# Patient Record
Sex: Male | Born: 1970 | Race: White | Hispanic: No | Marital: Married | Smoking: Never smoker
Health system: Southern US, Community
[De-identification: ages and names within clinical notes are randomized; demographics above are authoritative.]

## PROBLEM LIST (undated history)

## (undated) DIAGNOSIS — R7989 Other specified abnormal findings of blood chemistry: Secondary | ICD-10-CM

## (undated) DIAGNOSIS — K219 Gastro-esophageal reflux disease without esophagitis: Secondary | ICD-10-CM

## (undated) DIAGNOSIS — I10 Essential (primary) hypertension: Secondary | ICD-10-CM

## (undated) DIAGNOSIS — E119 Type 2 diabetes mellitus without complications: Secondary | ICD-10-CM

## (undated) DIAGNOSIS — I4891 Unspecified atrial fibrillation: Secondary | ICD-10-CM

## (undated) DIAGNOSIS — G4730 Sleep apnea, unspecified: Secondary | ICD-10-CM

## (undated) DIAGNOSIS — M109 Gout, unspecified: Secondary | ICD-10-CM

## (undated) HISTORY — PX: CARDIAC CATHETERIZATION: SHX172

## (undated) HISTORY — PX: COLONOSCOPY: SHX174

---

## 2006-01-29 ENCOUNTER — Ambulatory Visit: Payer: Self-pay | Admitting: Cardiology

## 2006-02-06 ENCOUNTER — Ambulatory Visit: Payer: Self-pay | Admitting: Cardiology

## 2006-02-08 ENCOUNTER — Inpatient Hospital Stay (HOSPITAL_BASED_OUTPATIENT_CLINIC_OR_DEPARTMENT_OTHER): Admission: RE | Admit: 2006-02-08 | Discharge: 2006-02-08 | Payer: Self-pay | Admitting: Cardiovascular Disease

## 2006-02-08 ENCOUNTER — Ambulatory Visit: Payer: Self-pay | Admitting: Cardiovascular Disease

## 2011-03-07 DIAGNOSIS — R079 Chest pain, unspecified: Secondary | ICD-10-CM

## 2012-02-11 ENCOUNTER — Encounter (INDEPENDENT_AMBULATORY_CARE_PROVIDER_SITE_OTHER): Payer: Self-pay

## 2014-09-30 ENCOUNTER — Encounter (INDEPENDENT_AMBULATORY_CARE_PROVIDER_SITE_OTHER): Payer: Self-pay | Admitting: *Deleted

## 2015-02-01 ENCOUNTER — Other Ambulatory Visit (INDEPENDENT_AMBULATORY_CARE_PROVIDER_SITE_OTHER): Payer: Self-pay | Admitting: *Deleted

## 2015-02-01 DIAGNOSIS — Z86010 Personal history of colonic polyps: Secondary | ICD-10-CM

## 2015-02-01 DIAGNOSIS — Z800 Family history of malignant neoplasm of digestive organs: Secondary | ICD-10-CM

## 2015-03-02 ENCOUNTER — Encounter (INDEPENDENT_AMBULATORY_CARE_PROVIDER_SITE_OTHER): Payer: Self-pay | Admitting: *Deleted

## 2015-03-02 ENCOUNTER — Telehealth (INDEPENDENT_AMBULATORY_CARE_PROVIDER_SITE_OTHER): Payer: Self-pay | Admitting: *Deleted

## 2015-03-02 MED ORDER — PEG 3350-KCL-NA BICARB-NACL 420 G PO SOLR: 4000.0000 mL | Freq: Once | ORAL | Status: DC

## 2015-03-02 NOTE — Telephone Encounter (Signed)
Patient needs trilyte 

## 2015-03-02 NOTE — Telephone Encounter (Signed)
This encounter was created in error - please disregard.

## 2015-03-04 ENCOUNTER — Encounter (INDEPENDENT_AMBULATORY_CARE_PROVIDER_SITE_OTHER): Payer: Self-pay | Admitting: *Deleted

## 2015-03-04 ENCOUNTER — Telehealth (INDEPENDENT_AMBULATORY_CARE_PROVIDER_SITE_OTHER): Payer: Self-pay | Admitting: *Deleted

## 2015-03-04 NOTE — Telephone Encounter (Signed)
Referring MD/PCP: shah   Procedure: tcs  Reason/Indication:  Hx polyps, fam hx colon ca  Has patient had this procedure before?  Yes, 2011 -- scanned  If so, when, by whom and where?    Is there a family history of colon cancer?  Yes, father  Who?  What age when diagnosed?    Is patient diabetic?   yes      Does patient have prosthetic heart valve or mechanical valve?  no  Do you have a pacemaker?  no  Has patient ever had endocarditis? Yes, possibly at age 45, diagnosis not definitive, was given PCN -- no antibiotic needed per Dr Laural Golden  Has patient had joint replacement within last 12 months?  no  Does patient tend to be constipated or take laxatives? no  Does patient have a history of alcohol/drug use?  no  Is patient on Coumadin, Plavix and/or Aspirin? yes  Medications: asa 81 mg daily, metformin 500 mg  Allergies: possibily PCN -- not definitive but avoids it  Medication Adjustment: asa 2 days, metformin evening before & mornin gof  Procedure date & time: 03/31/15 at 930

## 2015-03-08 NOTE — Telephone Encounter (Signed)
agree

## 2015-04-12 ENCOUNTER — Encounter (INDEPENDENT_AMBULATORY_CARE_PROVIDER_SITE_OTHER): Payer: Self-pay | Admitting: *Deleted

## 2015-05-11 ENCOUNTER — Encounter (HOSPITAL_COMMUNITY): Payer: Self-pay | Admitting: *Deleted

## 2015-05-11 ENCOUNTER — Encounter (HOSPITAL_COMMUNITY)
Admission: RE | Disposition: A | Discharge status: Home / Self Care | Payer: Self-pay | Source: Ambulatory Visit | Attending: Internal Medicine

## 2015-05-11 ENCOUNTER — Ambulatory Visit (HOSPITAL_COMMUNITY)
Admission: RE | Admit: 2015-05-11 | Discharge: 2015-05-11 | Disposition: A | Discharge status: Home / Self Care | Payer: PRIVATE HEALTH INSURANCE | Source: Ambulatory Visit | Attending: Internal Medicine | Admitting: Internal Medicine

## 2015-05-11 DIAGNOSIS — K648 Other hemorrhoids: Secondary | ICD-10-CM | POA: Insufficient documentation

## 2015-05-11 DIAGNOSIS — E669 Obesity, unspecified: Secondary | ICD-10-CM | POA: Diagnosis not present

## 2015-05-11 DIAGNOSIS — I10 Essential (primary) hypertension: Secondary | ICD-10-CM | POA: Diagnosis not present

## 2015-05-11 DIAGNOSIS — Z6841 Body Mass Index (BMI) 40.0 and over, adult: Secondary | ICD-10-CM | POA: Insufficient documentation

## 2015-05-11 DIAGNOSIS — K219 Gastro-esophageal reflux disease without esophagitis: Secondary | ICD-10-CM | POA: Insufficient documentation

## 2015-05-11 DIAGNOSIS — G4730 Sleep apnea, unspecified: Secondary | ICD-10-CM | POA: Insufficient documentation

## 2015-05-11 DIAGNOSIS — D122 Benign neoplasm of ascending colon: Secondary | ICD-10-CM | POA: Diagnosis not present

## 2015-05-11 DIAGNOSIS — Z880 Allergy status to penicillin: Secondary | ICD-10-CM | POA: Diagnosis not present

## 2015-05-11 DIAGNOSIS — Z1211 Encounter for screening for malignant neoplasm of colon: Secondary | ICD-10-CM | POA: Diagnosis present

## 2015-05-11 DIAGNOSIS — Z86010 Personal history of colonic polyps: Secondary | ICD-10-CM

## 2015-05-11 DIAGNOSIS — Z7984 Long term (current) use of oral hypoglycemic drugs: Secondary | ICD-10-CM | POA: Insufficient documentation

## 2015-05-11 DIAGNOSIS — Z79899 Other long term (current) drug therapy: Secondary | ICD-10-CM | POA: Insufficient documentation

## 2015-05-11 DIAGNOSIS — E119 Type 2 diabetes mellitus without complications: Secondary | ICD-10-CM | POA: Insufficient documentation

## 2015-05-11 DIAGNOSIS — Z800 Family history of malignant neoplasm of digestive organs: Secondary | ICD-10-CM | POA: Diagnosis not present

## 2015-05-11 HISTORY — DX: Sleep apnea, unspecified: G47.30

## 2015-05-11 HISTORY — DX: Type 2 diabetes mellitus without complications: E11.9

## 2015-05-11 HISTORY — PX: COLONOSCOPY: SHX5424

## 2015-05-11 HISTORY — DX: Essential (primary) hypertension: I10

## 2015-05-11 HISTORY — DX: Gastro-esophageal reflux disease without esophagitis: K21.9

## 2015-05-11 LAB — GLUCOSE, CAPILLARY: GLUCOSE-CAPILLARY: 148 mg/dL — AB (ref 65–99)

## 2015-05-11 SURGERY — COLONOSCOPY
Anesthesia: Moderate Sedation

## 2015-05-11 MED ORDER — STERILE WATER FOR IRRIGATION IR SOLN
Status: DC | PRN
  Administered 2015-05-11: 09:00:00

## 2015-05-11 MED ORDER — MIDAZOLAM HCL 5 MG/5ML IJ SOLN
INTRAMUSCULAR | Status: DC | PRN
  Administered 2015-05-11: 3 mg via INTRAVENOUS
  Administered 2015-05-11 (×3): 2 mg via INTRAVENOUS
  Administered 2015-05-11: 1 mg via INTRAVENOUS
  Administered 2015-05-11 (×2): 2 mg via INTRAVENOUS
  Administered 2015-05-11: 1 mg via INTRAVENOUS

## 2015-05-11 MED ORDER — SODIUM CHLORIDE 0.9 % IV SOLN
INTRAVENOUS | Status: DC
  Administered 2015-05-11: 09:00:00 via INTRAVENOUS

## 2015-05-11 MED ORDER — MEPERIDINE HCL 50 MG/ML IJ SOLN
INTRAMUSCULAR | Status: AC
  Filled 2015-05-11: qty 1

## 2015-05-11 MED ORDER — MIDAZOLAM HCL 5 MG/5ML IJ SOLN
INTRAMUSCULAR | Status: AC
  Filled 2015-05-11: qty 5

## 2015-05-11 MED ORDER — MEPERIDINE HCL 50 MG/ML IJ SOLN
INTRAMUSCULAR | Status: DC | PRN
  Administered 2015-05-11 (×4): 25 mg via INTRAVENOUS

## 2015-05-11 MED ORDER — MIDAZOLAM HCL 5 MG/5ML IJ SOLN
INTRAMUSCULAR | Status: AC
  Filled 2015-05-11: qty 10

## 2015-05-11 NOTE — H&P (Addendum)
Albert [name] is an 45 y.o. male.   Chief Complaint: Patient is here for colonoscopy. HPI: Patient is 43 old Caucasian male who is here for screening colonoscopy. He denies abdominal pain change in bowel habits or rectal bleeding. Last colonoscopy was normal in July 2011 with removal of small tubular adenoma an sessile serrated polyp. Family history significant for CRC and father who was 6 at the time of diagnosis and doing well at 33.  Past Medical History  Diagnosis Date  . Hypertension   . Diabetes mellitus without complication ([place])   . Sleep apnea   . GERD (gastroesophageal reflux disease)     Past Surgical History  Procedure Laterality Date  . Cardiac catheterization    . Colonoscopy      Family History  Problem Relation Age of Onset  . Colon cancer Father    Social History:  reports that he has never smoked. He does not have any smokeless tobacco history on file. He reports that he drinks alcohol. He reports that he does not use illicit drugs.  Allergies:  Allergies  Allergen Reactions  . Penicillins     Medications Prior to Admission  Medication Sig Dispense Refill  . anastrozole (ARIMIDEX) 1 MG tablet Take 1 mg by mouth daily.    [name] lisinopril (PRINIVIL,ZESTRIL) 40 MG tablet Take 40 mg by mouth daily.    . metFORMIN (GLUCOPHAGE) 500 MG tablet Take 500 mg by mouth 2 (two) times daily with a meal.    . omeprazole (PRILOSEC) 20 MG capsule Take 20 mg by mouth daily.    . polyethylene glycol-electrolytes (NULYTELY/GOLYTELY) 420 G solution Take 4,000 mLs by mouth once. 4000 mL 0  . tadalafil (CIALIS) 20 MG tablet Take 20 mg by mouth daily as needed for erectile dysfunction.    [name] testosterone cypionate (DEPOTESTOSTERONE CYPIONATE) 200 MG/ML injection Inject 200 mg into the muscle every 14 (fourteen) days.      Results for orders placed or performed during the hospital encounter of 05/11/15 (from the past 48 hour(s))  Glucose, capillary     Status: Abnormal   Collection Time: 05/11/15  8:56 AM  Result Value Ref Range   Glucose-Capillary 148 (H) 65 - 99 mg/dL   No results found.  ROS  Blood pressure 127/86, pulse 101, temperature 97.7 F (36.5 C), temperature source Oral, resp. rate 20, height 5\' 10"  (1.778 m), weight 380 lb (172.367 kg), SpO2 95 %. Physical Exam  Constitutional:  WD Obese Caucasian male in NAD  HENT:  Mouth/Throat: Oropharynx is clear and moist.  Eyes: Conjunctivae are normal. No scleral icterus.  Neck: No thyromegaly present.  Cardiovascular: Normal rate, regular rhythm and normal heart sounds.   No murmur heard. Respiratory: Effort normal and breath sounds normal.  GI: Soft. He exhibits no distension and no mass. There is no tenderness.  Musculoskeletal: He exhibits no edema.  Lymphadenopathy:    He has no cervical adenopathy.  Neurological: He is alert.  Skin: Skin is warm and dry.     Assessment/Plan  history of colonic adenomas.  History of CRC in father.  Surveillance colonoscopy.[name]  Rogene Houston, MD 05/11/2015, 9:15 AM

## 2015-05-11 NOTE — Discharge Instructions (Signed)
No aspirin or NSAIDs for 1 week. Resume usual medications and diet. No driving for 24 hours. Patient will call with biopsy results.    Colonoscopy, Care After These instructions give you information on caring for yourself after your procedure. Your doctor may also give you more specific instructions. Call your doctor if you have any problems or questions after your procedure. HOME CARE  Do not drive for 24 hours.  Do not sign important papers or use machinery for 24 hours.  You may shower.  You may go back to your usual activities, but go slower for the first 24 hours.  Take rest breaks often during the first 24 hours.  Walk around or use warm packs on your belly (abdomen) if you have belly cramping or gas.  Drink enough fluids to keep your pee (urine) clear or pale yellow.  Resume your normal diet. Avoid heavy or fried foods.  Avoid drinking alcohol for 24 hours or as told by your doctor.  Only take medicines as told by your doctor. If a tissue sample (biopsy) was taken during the procedure:   Do not take aspirin or blood thinners for 7 days, or as told by your doctor.  Do not drink alcohol for 7 days, or as told by your doctor.  Eat soft foods for the first 24 hours. GET HELP IF: You still have a small amount of blood in your poop (stool) 2-3 days after the procedure. GET HELP RIGHT AWAY IF:  You have more than a small amount of blood in your poop.  You see clumps of tissue (blood clots) in your poop.  Your belly is puffy (swollen).  You feel sick to your stomach (nauseous) or throw up (vomit).  You have a fever.  You have belly pain that gets worse and medicine does not help. MAKE SURE YOU:  Understand these instructions.  Will watch your condition.  Will get help right away if you are not doing well or get worse.   This information is not intended to replace advice given to you by your health care provider. Make sure you discuss any questions you have  with your health care provider.   Document Released: 04/14/2010 Document Revised: 03/17/2013 Document Reviewed: 11/17/2012 Elsevier Interactive Patient Education 2016 Elsevier Inc[employer]   Colon Polyps Polyps are lumps of extra tissue growing inside the body. Polyps can grow in the large intestine (colon). Most colon polyps are noncancerous (benign). However, some colon polyps can become cancerous over time. Polyps that are larger than a pea may be harmful. To be safe, caregivers remove and test all polyps. CAUSES  Polyps form when mutations in the genes cause your cells to grow and divide even though no more tissue is needed. RISK FACTORS There are a number of risk factors that can increase your chances of getting colon polyps. They include:  Being older than 50 years.  Family history of colon polyps or colon cancer.  Long-term colon diseases, such as colitis or Crohn disease.  Being overweight.  Smoking.  Being inactive.  Drinking too much alcohol. SYMPTOMS  Most small polyps do not cause symptoms. If symptoms are present, they may include:  Blood in the stool. The stool may look dark red or black.  Constipation or diarrhea that lasts longer than 1 week. DIAGNOSIS People often do not know they have polyps until their caregiver finds them during a regular checkup. Your caregiver can use 4 tests to check for polyps:  Digital rectal exam. The  caregiver wears gloves and feels inside the rectum. This test would find polyps only in the rectum.  Barium enema. The caregiver puts a liquid called barium into your rectum before taking X-rays of your colon. Barium makes your colon look white. Polyps are dark, so they are easy to see in the X-ray pictures.  Sigmoidoscopy. A thin, flexible tube (sigmoidoscope) is placed into your rectum. The sigmoidoscope has a light and tiny camera in it. The caregiver uses the sigmoidoscope to look at the last third of your colon.  Colonoscopy. This test is  like sigmoidoscopy, but the caregiver looks at the entire colon. This is the most common method for finding and removing polyps. TREATMENT  Any polyps will be removed during a sigmoidoscopy or colonoscopy. The polyps are then tested for cancer. PREVENTION  To help lower your risk of getting more colon polyps:  Eat plenty of fruits and vegetables. Avoid eating fatty foods.  Do not smoke.  Avoid drinking alcohol.  Exercise every day.  Lose weight if recommended by your caregiver.  Eat plenty of calcium and folate. Foods that are rich in calcium include milk, cheese, and broccoli. Foods that are rich in folate include chickpeas, kidney beans, and spinach. HOME CARE INSTRUCTIONS Keep all follow-up appointments as directed by your caregiver. You may need periodic exams to check for polyps. SEEK MEDICAL CARE IF: You notice bleeding during a bowel movement.   This information is not intended to replace advice given to you by your health care provider. Make sure you discuss any questions you have with your health care provider.   Document Released: 12/07/2003 Document Revised: 04/02/2014 Document Reviewed: 05/22/2011 Elsevier Interactive Patient Education 2016 [employer].  Hemorrhoids Hemorrhoids are swollen veins around the rectum or anus. There are two types of hemorrhoids:   Internal hemorrhoids. These occur in the veins just inside the rectum. They may poke through to the outside and become irritated and painful.  External hemorrhoids. These occur in the veins outside the anus and can be felt as a painful swelling or hard lump near the anus. CAUSES  Pregnancy.   Obesity.   Constipation or diarrhea.   Straining to have a bowel movement.   Sitting for long periods on the toilet.  Heavy lifting or other activity that caused you to strain.  Anal intercourse. SYMPTOMS   Pain.   Anal itching or irritation.   Rectal bleeding.   Fecal leakage.   Anal swelling.    One or more lumps around the anus.  DIAGNOSIS  Your caregiver may be able to diagnose hemorrhoids by visual examination. Other examinations or tests that may be performed include:   Examination of the rectal area with a gloved hand (digital rectal exam).   Examination of anal canal using a small tube (scope).   A blood test if you have lost a significant amount of blood.  A test to look inside the colon (sigmoidoscopy or colonoscopy). TREATMENT Most hemorrhoids can be treated at home. However, if symptoms do not seem to be getting better or if you have a lot of rectal bleeding, your caregiver may perform a procedure to help make the hemorrhoids get smaller or remove them completely. Possible treatments include:   Placing a rubber band at the base of the hemorrhoid to cut off the circulation (rubber band ligation).   Injecting a chemical to shrink the hemorrhoid (sclerotherapy).   Using a tool to burn the hemorrhoid (infrared light therapy).   Surgically removing the hemorrhoid (  hemorrhoidectomy).   Stapling the hemorrhoid to block blood flow to the tissue (hemorrhoid stapling).  HOME CARE INSTRUCTIONS   Eat foods with fiber, such as whole grains, beans, nuts, fruits, and vegetables. Ask your doctor about taking products with added fiber in them (fibersupplements).  Increase fluid intake. Drink enough water and fluids to keep your urine clear or pale yellow.   Exercise regularly.   Go to the bathroom when you have the urge to have a bowel movement. Do not wait.   Avoid straining to have bowel movements.   Keep the anal area dry and clean. Use wet toilet paper or moist towelettes after a bowel movement.   Medicated creams and suppositories may be used or applied as directed.   Only take over-the-counter or prescription medicines as directed by your caregiver.   Take warm sitz baths for 15-20 minutes, 3-4 times a day to ease pain and discomfort.   Place  ice packs on the hemorrhoids if they are tender and swollen. Using ice packs between sitz baths may be helpful.   Put ice in a plastic bag.   Place a towel between your skin and the bag.   Leave the ice on for 15-20 minutes, 3-4 times a day.   Do not use a donut-shaped pillow or sit on the toilet for long periods. This increases blood pooling and pain.  SEEK MEDICAL CARE IF:  You have increasing pain and swelling that is not controlled by treatment or medicine.  You have uncontrolled bleeding.  You have difficulty or you are unable to have a bowel movement.  You have pain or inflammation outside the area of the hemorrhoids. MAKE SURE YOU:  Understand these instructions.  Will watch your condition.  Will get help right away if you are not doing well or get worse.   This information is not intended to replace advice given to you by your health care provider. Make sure you discuss any questions you have with your health care provider.   Document Released: 03/09/2000 Document Revised: 02/27/2012 Document Reviewed: 01/15/2012 Elsevier Interactive Patient Education [employer].

## 2015-05-11 NOTE — Op Note (Addendum)
COLONOSCOPY PROCEDURE REPORT  PATIENT:  Albert [name]  MR#:  [id number] Birthdate:  Oct 23, 1970, 45 y.o., male Endoscopist:  Dr. Rogene Houston, MD Referred By:  Dr. Monico Blitz, MD  Procedure Date: 05/11/2015  Procedure:   Colonoscopy with snare polypectomy.  Indications:  Patient is 45 year old Caucasian male who is undergoing high risk screening colonoscopy. Last exam was on 10/12/2009 with removal of 2 small polyps. Family history significant for CRC and father who was 51 at the time of diagnosis and is doing fine 12 years later.  Informed Consent:  The procedure and risks were reviewed with the patient and informed consent was obtained.  Medications:  Demerol 100 mg IV Versed 15 mg IV  First dose administered at. Last dose administered at  Description of procedure:  After a digital rectal exam was performed, that colonoscope was advanced from the anus through the rectum and colon to the area of the cecum, ileocecal valve and appendiceal orifice. The cecum was deeply intubated. These structures were well-seen and photographed for the record. From the level of the cecum and ileocecal valve, the scope was slowly and cautiously withdrawn. The mucosal surfaces were carefully surveyed utilizing scope tip to flexion to facilitate fold flattening as needed. The scope was pulled down into the rectum where a thorough exam including retroflexion was performed.  Findings:   Prep satisfactory. Lateral liquid stool was washed out of cecum nor to see the landmarks. 7 mm polyp hot snared from ascending colon. Another small polyp at ascending colon was coagulated with snare tip. Mucosa of rest of the colon and rectum was normal. Small hemorrhoids noted above the dentate line.    Therapeutic/Diagnostic Maneuvers Performed:  See above  Complications:  None  EBL: None  Cecal Withdrawal Time:  11 minutes  Impression:  Examination performed to cecum. 7 mm polyp hot snared from ascending  colon. Small polyp at ascending colon was coagulated with snare tip. Small internal hemorrhoids.  Recommendations:  Standard instructions given. I will contact patient with biopsy results and further recommendations. Will consider next colonoscopy under monitored anesthesia care since patient is difficult to sedate.  Lochlann Mastrangelo U  05/11/2015 10:04 AM  CC: Dr. Monico Blitz, MD & Dr. Rayne Du ref. provider found

## 2015-05-13 ENCOUNTER — Encounter (HOSPITAL_COMMUNITY): Payer: Self-pay | Admitting: Internal Medicine

## 2019-09-24 ENCOUNTER — Emergency Department (HOSPITAL_COMMUNITY): Payer: BC Managed Care – PPO

## 2019-09-24 ENCOUNTER — Emergency Department (HOSPITAL_COMMUNITY)
Admission: EM | Admit: 2019-09-24 | Discharge: 2019-09-24 | Disposition: A | Discharge status: Home / Self Care | Payer: BC Managed Care – PPO | Attending: Emergency Medicine | Admitting: Emergency Medicine

## 2019-09-24 ENCOUNTER — Other Ambulatory Visit: Payer: Self-pay

## 2019-09-24 ENCOUNTER — Encounter (HOSPITAL_COMMUNITY): Payer: Self-pay

## 2019-09-24 DIAGNOSIS — E119 Type 2 diabetes mellitus without complications: Secondary | ICD-10-CM | POA: Insufficient documentation

## 2019-09-24 DIAGNOSIS — I10 Essential (primary) hypertension: Secondary | ICD-10-CM | POA: Diagnosis not present

## 2019-09-24 DIAGNOSIS — R1011 Right upper quadrant pain: Secondary | ICD-10-CM | POA: Diagnosis not present

## 2019-09-24 HISTORY — DX: Gout, unspecified: M10.9

## 2019-09-24 HISTORY — DX: Other specified abnormal findings of blood chemistry: R79.89

## 2019-09-24 LAB — COMPREHENSIVE METABOLIC PANEL
ALT: 42 U/L (ref 0–44)
AST: 39 U/L (ref 15–41)
Albumin: 4.3 g/dL (ref 3.5–5.0)
Alkaline Phosphatase: 37 U/L — ABNORMAL LOW (ref 38–126)
Anion gap: 13 (ref 5–15)
BUN: 39 mg/dL — ABNORMAL HIGH (ref 6–20)
CO2: 21 mmol/L — ABNORMAL LOW (ref 22–32)
Calcium: 8.6 mg/dL — ABNORMAL LOW (ref 8.9–10.3)
Chloride: 102 mmol/L (ref 98–111)
Creatinine, Ser: 1.75 mg/dL — ABNORMAL HIGH (ref 0.61–1.24)
GFR calc Af Amer: 52 mL/min — ABNORMAL LOW (ref >60)
GFR calc non Af Amer: 45 mL/min — ABNORMAL LOW (ref >60)
Glucose, Bld: 104 mg/dL — ABNORMAL HIGH (ref 70–99)
Potassium: 5.3 mmol/L — ABNORMAL HIGH (ref 3.5–5.1)
Sodium: 136 mmol/L (ref 135–145)
Total Bilirubin: 1 mg/dL (ref 0.3–1.2)
Total Protein: 7.4 g/dL (ref 6.5–8.1)

## 2019-09-24 LAB — CBC
HCT: 45.2 % (ref 39.0–52.0)
Hemoglobin: 15.3 g/dL (ref 13.0–17.0)
MCH: 31.9 pg (ref 26.0–34.0)
MCHC: 33.8 g/dL (ref 30.0–36.0)
MCV: 94.4 fL (ref 80.0–100.0)
Platelets: 199 10*3/uL (ref 150–400)
RBC: 4.79 MIL/uL (ref 4.22–5.81)
RDW: 13.2 % (ref 11.5–15.5)
WBC: 8.7 10*3/uL (ref 4.0–10.5)
nRBC: 0 % (ref 0.0–0.2)

## 2019-09-24 LAB — URINALYSIS, ROUTINE W REFLEX MICROSCOPIC
Bilirubin Urine: NEGATIVE
Glucose, UA: NEGATIVE mg/dL
Hgb urine dipstick: NEGATIVE
Ketones, ur: 5 mg/dL — AB
Leukocytes,Ua: NEGATIVE
Nitrite: NEGATIVE
Protein, ur: NEGATIVE mg/dL
Specific Gravity, Urine: 1.01 (ref 1.005–1.030)
pH: 5 (ref 5.0–8.0)

## 2019-09-24 LAB — LIPASE, BLOOD: Lipase: 34 U/L (ref 11–51)

## 2019-09-24 LAB — TROPONIN I (HIGH SENSITIVITY)
Troponin I (High Sensitivity): 5 ng/L (ref <18)
Troponin I (High Sensitivity): 6 ng/L (ref <18)

## 2019-09-24 MED ORDER — PANTOPRAZOLE SODIUM 40 MG PO TBEC: 40.0000 mg | DELAYED_RELEASE_TABLET | Freq: Every day | ORAL | 0 refills | Status: AC

## 2019-09-24 MED ORDER — SODIUM CHLORIDE 0.9% FLUSH
3.0000 mL | Freq: Once | INTRAVENOUS | Status: AC
  Administered 2019-09-24: 3 mL via INTRAVENOUS

## 2019-09-24 MED ORDER — DICYCLOMINE HCL 20 MG PO TABS
20.0000 mg | ORAL_TABLET | Freq: Three times a day (TID) | ORAL | 0 refills | Status: DC | PRN
Start: 2019-09-24 — End: 2020-01-06

## 2019-09-24 NOTE — ED Notes (Signed)
US at bedside

## 2019-09-24 NOTE — Discharge Instructions (Signed)
You were seen in the emergency room today with upper abdominal pain.  Your lab work, ultrasound, heart work-up were normal.  I am restarting your Protonix to take daily as directed.  I have also called in a prescription for Bentyl which you can take as needed for belly cramping.  If you develop worsening pain, shortness of breath, fevers you should return to the emergency department immediately for evaluation.  Please follow-up closely with your primary care doctor.  If your symptoms continue you may require follow-up with a specialist such as a gastroenterologist with your PCP can help to coordinate.

## 2019-09-24 NOTE — ED Provider Notes (Signed)
Emergency Department Provider Note   I have reviewed the triage vital signs and the nursing notes.   HISTORY  Chief Complaint Abdominal Pain and abdominal swelling   HPI Albert [name] is a 49 y.o. male with past medical history reviewed below presents to the emergency department with right upper quadrant abdominal pain starting this morning.  Patient awoke this morning feeling fine.  He ate sausage with eggs with no pain or nausea.  He was driving into work when he began to experience pain in the right upper abdomen radiating around to his back.  He denies any radiation of pain up into the chest or shortness of breath.  No nausea, vomiting, diarrhea.  No fevers or chills.  He had a similar episode of pain 3 years ago which lasted for approximately 1 hour and then resolved. Rates pain currently at 4/10 in severity and "toothache" in quality.    Past Medical History:  Diagnosis Date  . Diabetes mellitus without complication ([place])   . GERD (gastroesophageal reflux disease)   . Gout   . Hypertension   . Low testosterone   . Sleep apnea     There are no problems to display for this patient.   Past Surgical History:  Procedure Laterality Date  . CARDIAC CATHETERIZATION    . COLONOSCOPY    . COLONOSCOPY N/A 05/11/2015   Procedure: COLONOSCOPY;  Surgeon: Rogene Houston, MD;  Location: [facility];  Service: Endoscopy;  Laterality: N/A;  930 - moved to 2/15 - Ann to notify    Allergies Penicillins and Levaquin [levofloxacin]  Family History  Problem Relation Age of Onset  . Colon cancer Father     Social History Social History   Tobacco Use  . Smoking status: Never Smoker  . Smokeless tobacco: Never Used  Vaping Use  . Vaping Use: Never used  Substance Use Topics  . Alcohol use: Yes    Comment: occasionally  . Drug use: No    Review of Systems  Constitutional: No fever/chills Eyes: No visual changes. ENT: No sore throat. Cardiovascular: Denies chest  pain. Respiratory: Denies shortness of breath. Gastrointestinal: Positive RUQ abdominal pain.  No nausea, no vomiting.  No diarrhea.  No constipation. Genitourinary: Negative for dysuria. Musculoskeletal: Negative for back pain. Skin: Negative for rash. Neurological: Negative for headaches, focal weakness or numbness.  10-point ROS otherwise negative.  ____________________________________________   PHYSICAL EXAM:  VITAL SIGNS: Vitals:   09/24/19 1159 09/24/19 1228  BP: (!) 154/91 (!) 164/89  Pulse: 84 86  Resp: 18 (!) 25  SpO2: 99% 97%   Constitutional: Alert and oriented. Well appearing and in no acute distress. Eyes: Conjunctivae are normal.  Head: Atraumatic. Nose: No congestion/rhinnorhea. Mouth/Throat: Mucous membranes are moist.  Neck: No stridor.   Cardiovascular: Normal rate, regular rhythm. Good peripheral circulation. Grossly normal heart sounds.   Respiratory: Normal respiratory effort.  No retractions. Lungs CTAB. Gastrointestinal: Soft with mild RUQ tenderness. No rebound or guarding. Negative Murphy's sign. No distention.  Musculoskeletal: No gross deformities of extremities. Neurologic:  Normal speech and language.  Skin:  Skin is warm, dry and intact. No rash noted.   ____________________________________________   LABS (all labs ordered are listed, but only abnormal results are displayed)  Labs Reviewed  COMPREHENSIVE METABOLIC PANEL - Abnormal; Notable for the following components:      Result Value   Potassium 5.3 (*)    CO2 21 (*)    Glucose, Bld 104 (*)  BUN 39 (*)    Creatinine, Ser 1.75 (*)    Calcium 8.6 (*)    Alkaline Phosphatase 37 (*)    GFR calc non Af Amer 45 (*)    GFR calc Af Amer 52 (*)    All other components within normal limits  URINALYSIS, ROUTINE W REFLEX MICROSCOPIC - Abnormal; Notable for the following components:   Ketones, ur 5 (*)    All other components within normal limits  LIPASE, BLOOD  CBC  TROPONIN I (HIGH  SENSITIVITY)  TROPONIN I (HIGH SENSITIVITY)   ____________________________________________  EKG   EKG Interpretation  Date/Time:  Thursday September 24 2019 09:47:39 EDT Ventricular Rate:  89 PR Interval:    QRS Duration: 102 QT Interval:  356 QTC Calculation: 434 R Axis:   -9 Text Interpretation: Sinus rhythm Low voltage, precordial leads No STEMI Confirmed by Nanda Quinton [phone]) on 09/24/2019 9:53:38 AM       ____________________________________________  RADIOLOGY  US Abdomen Limited RUQ  Result Date: 09/24/2019 CLINICAL DATA:  Right upper quadrant pain. EXAM: ULTRASOUND ABDOMEN LIMITED RIGHT UPPER QUADRANT COMPARISON:  None. FINDINGS: Gallbladder: Partially collapsed gallbladder. No gallstones, gallbladder wall thickening, sonographic Murphy's sign or pericholecystic fluid. The Common bile duct: Diameter: 6 mm. Liver: Diffusely increased parenchymal echogenicity compatible with hepatic steatosis. No focal liver abnormality. Portal vein is patent on color Doppler imaging with normal direction of blood flow towards the liver. Other: None. IMPRESSION: 1. No acute findings. 2. Echogenic liver compatible with diffuse hepatic steatosis. Electronically Signed   By: Kerby Moors M.D.   On: 09/24/2019 11:09    ____________________________________________   PROCEDURES  Procedure(s) performed:   Procedures  None  ____________________________________________   INITIAL IMPRESSION / ASSESSMENT AND PLAN / ED COURSE  Pertinent labs & imaging results that were available during my care of the patient were reviewed by me and considered in my medical decision making (see chart for details).   Patient presents emergency department for evaluation of right upper quadrant abdominal pain starting this morning. Mild focal tenderness in the RUQ. No rebound or guarding.  Suspicion for GI pathology primarily.  He has mild tenderness in the right upper quadrant and plan for ultrasound of this area  along with complete metabolic panel and lipase.  Exceedingly low suspicion for ACS but atypical ACS is possible.  Plan for screening EKG and troponin.   01:19 PM  Repeat troponin is negative.  Patient symptoms have resolved. Plan for d/c with close PCP follow up plan.  ____________________________________________  FINAL CLINICAL IMPRESSION(S) / ED DIAGNOSES  Final diagnoses:  RUQ abdominal pain     MEDICATIONS GIVEN DURING THIS VISIT:  Medications  sodium chloride flush (NS) 0.9 % injection 3 mL (3 mLs Intravenous Given 09/24/19 0956)     NEW OUTPATIENT MEDICATIONS STARTED DURING THIS VISIT:  New Prescriptions   DICYCLOMINE (BENTYL) 20 MG TABLET    Take 1 tablet (20 mg total) by mouth 3 (three) times daily as needed for spasms (abdominal cramping).   PANTOPRAZOLE (PROTONIX) 40 MG TABLET    Take 1 tablet (40 mg total) by mouth daily.    Note:  This document was prepared using Dragon voice recognition software and may include unintentional dictation errors.  Nanda Quinton, MD, [facility]    Prashant Glosser, [facility], MD 09/28/19 [phone]

## 2019-09-24 NOTE — ED Triage Notes (Signed)
Patient reports that he woke this AM with right abdominal pain then he noted that he had swelling in the same area. Patient denies any N/v/D

## 2019-12-25 ENCOUNTER — Other Ambulatory Visit: Payer: Self-pay

## 2019-12-25 ENCOUNTER — Emergency Department (HOSPITAL_COMMUNITY): Payer: BC Managed Care – PPO

## 2019-12-25 ENCOUNTER — Encounter (HOSPITAL_COMMUNITY): Payer: Self-pay | Admitting: Emergency Medicine

## 2019-12-25 ENCOUNTER — Observation Stay (HOSPITAL_COMMUNITY)
Admission: EM | Admit: 2019-12-25 | Discharge: 2019-12-26 | Disposition: A | Discharge status: Home / Self Care | Payer: BC Managed Care – PPO | Attending: Emergency Medicine | Admitting: Emergency Medicine

## 2019-12-25 DIAGNOSIS — N179 Acute kidney failure, unspecified: Secondary | ICD-10-CM | POA: Diagnosis present

## 2019-12-25 DIAGNOSIS — I4891 Unspecified atrial fibrillation: Secondary | ICD-10-CM | POA: Diagnosis not present

## 2019-12-25 DIAGNOSIS — I129 Hypertensive chronic kidney disease with stage 1 through stage 4 chronic kidney disease, or unspecified chronic kidney disease: Secondary | ICD-10-CM | POA: Insufficient documentation

## 2019-12-25 DIAGNOSIS — Z79899 Other long term (current) drug therapy: Secondary | ICD-10-CM | POA: Insufficient documentation

## 2019-12-25 DIAGNOSIS — E119 Type 2 diabetes mellitus without complications: Secondary | ICD-10-CM | POA: Insufficient documentation

## 2019-12-25 DIAGNOSIS — Z7982 Long term (current) use of aspirin: Secondary | ICD-10-CM | POA: Insufficient documentation

## 2019-12-25 DIAGNOSIS — I10 Essential (primary) hypertension: Secondary | ICD-10-CM | POA: Diagnosis present

## 2019-12-25 DIAGNOSIS — N183 Chronic kidney disease, stage 3 unspecified: Secondary | ICD-10-CM | POA: Insufficient documentation

## 2019-12-25 DIAGNOSIS — Z20822 Contact with and (suspected) exposure to covid-19: Secondary | ICD-10-CM | POA: Insufficient documentation

## 2019-12-25 DIAGNOSIS — R002 Palpitations: Secondary | ICD-10-CM

## 2019-12-25 LAB — CBC
HCT: 51.3 % (ref 39.0–52.0)
Hemoglobin: 17.1 g/dL — ABNORMAL HIGH (ref 13.0–17.0)
MCH: 32 pg (ref 26.0–34.0)
MCHC: 33.3 g/dL (ref 30.0–36.0)
MCV: 96.1 fL (ref 80.0–100.0)
Platelets: 280 10*3/uL (ref 150–400)
RBC: 5.34 MIL/uL (ref 4.22–5.81)
RDW: 14.3 % (ref 11.5–15.5)
WBC: 11 10*3/uL — ABNORMAL HIGH (ref 4.0–10.5)
nRBC: 0 % (ref 0.0–0.2)

## 2019-12-25 LAB — TROPONIN I (HIGH SENSITIVITY): Troponin I (High Sensitivity): 7 ng/L (ref <18)

## 2019-12-25 LAB — BASIC METABOLIC PANEL
Anion gap: 9 (ref 5–15)
BUN: 38 mg/dL — ABNORMAL HIGH (ref 6–20)
CO2: 25 mmol/L (ref 22–32)
Calcium: 9.4 mg/dL (ref 8.9–10.3)
Chloride: 105 mmol/L (ref 98–111)
Creatinine, Ser: 2.05 mg/dL — ABNORMAL HIGH (ref 0.61–1.24)
GFR calc Af Amer: 43 mL/min — ABNORMAL LOW (ref >60)
GFR calc non Af Amer: 37 mL/min — ABNORMAL LOW (ref >60)
Glucose, Bld: 126 mg/dL — ABNORMAL HIGH (ref 70–99)
Potassium: 5 mmol/L (ref 3.5–5.1)
Sodium: 139 mmol/L (ref 135–145)

## 2019-12-25 LAB — MAGNESIUM: Magnesium: 2 mg/dL (ref 1.7–2.4)

## 2019-12-25 MED ORDER — HEPARIN BOLUS VIA INFUSION
5000.0000 [IU] | Freq: Once | INTRAVENOUS | Status: AC
  Administered 2019-12-25: 5000 [IU] via INTRAVENOUS
  Filled 2019-12-25: qty 5000

## 2019-12-25 MED ORDER — DILTIAZEM HCL-DEXTROSE 125-5 MG/125ML-% IV SOLN (PREMIX)
5.0000 mg/h | INTRAVENOUS | Status: DC
  Administered 2019-12-25: 5 mg/h via INTRAVENOUS

## 2019-12-25 MED ORDER — HEPARIN (PORCINE) 25000 UT/250ML-% IV SOLN
1900.0000 [IU]/h | INTRAVENOUS | Status: DC
  Administered 2019-12-25: 1700 [IU]/h via INTRAVENOUS
  Filled 2019-12-25 (×2): qty 250

## 2019-12-25 MED ORDER — DILTIAZEM LOAD VIA INFUSION
15.0000 mg | Freq: Once | INTRAVENOUS | Status: DC
  Filled 2019-12-25: qty 15

## 2019-12-25 MED ORDER — DILTIAZEM HCL-DEXTROSE 125-5 MG/125ML-% IV SOLN (PREMIX)
5.0000 mg/h | INTRAVENOUS | Status: DC
  Filled 2019-12-25: qty 125

## 2019-12-25 MED ORDER — SODIUM CHLORIDE 0.9 % IV BOLUS
1000.0000 mL | Freq: Once | INTRAVENOUS | Status: AC
  Administered 2019-12-25: 1000 mL via INTRAVENOUS

## 2019-12-25 MED ORDER — DILTIAZEM LOAD VIA INFUSION
15.0000 mg | Freq: Once | INTRAVENOUS | Status: AC
  Administered 2019-12-25: 15 mg via INTRAVENOUS
  Filled 2019-12-25: qty 15

## 2019-12-25 NOTE — ED Triage Notes (Signed)
Pt reports palpitations intermittent x 2 weeks with shob, worse tonight with chest pressure

## 2019-12-25 NOTE — Progress Notes (Signed)
ANTICOAGULATION CONSULT NOTE - Initial Consult  Pharmacy Consult for Heparin Indication: atrial fibrillation  Allergies  Allergen Reactions  . Penicillins Other (See Comments)    Unknown childhood  . Levaquin [Levofloxacin] Palpitations    Patient Measurements: Height: 5\' 11"  (180.3 cm) Weight: 136.1 kg (300 lb) IBW/kg (Calculated) : 75.3 Heparin Dosing Weight: 107 kg  Vital Signs: Temp: 98.4 F (36.9 C) (10/01 2205) Temp Source: Oral (10/01 2205) BP: 119/75 (10/01 2300) Pulse Rate: 121 (10/01 2300)  Labs: Recent Labs    12/25/19 2222  HGB 17.1*  HCT 51.3  PLT 280    CrCl cannot be calculated (Patient's most recent lab result is older than the maximum 21 days allowed.).   Medical History: Past Medical History:  Diagnosis Date  . Diabetes mellitus without complication ([place])   . GERD (gastroesophageal reflux disease)   . Gout   . Hypertension   . Low testosterone   . Sleep apnea     Medications:  Awaiting electronic med rec  Assessment: 49 y.o. M presents with afib. To begin heparin per pharmacy. No AC PTA. CBC ok on admission.  Goal of Therapy:  Heparin level 0.3-0.7 units/ml Monitor platelets by anticoagulation protocol: Yes   Plan:  Heparin IV bolus 5000 units Heparin gtt at 1700 units/hr Will f/u heparin level in 6 hours Daily heparin level and CBC   Sherlon Handing, PharmD, BCPS Please see amion for complete clinical pharmacist phone list 12/25/2019,11:16 PM

## 2019-12-25 NOTE — ED Provider Notes (Signed)
[facility] EMERGENCY DEPARTMENT Provider Note   CSN: [id number] Arrival date & time: 12/25/19  2151     History Chief Complaint  Patient presents with  . Palpitations    Albert [name] is a 49 y.o. male presenting for evaluation of intermittent palpitations and chest pressure.  Patient states intermittently over the past 2 weeks, he has felt like his heart is racing and had associated chest tightness.  He states this is happening about 3-4 times a day, usually lasting for about a minute.  Symptoms are worse tonight, which is prompted his ER visit.  While I am evaluating, he denies any feeling of palpitations or chest tightness.  He states he started to cough today, and is nonproductive.  Denies recent fevers, chills, sore throat, shortness of breath, nausea, vomiting, Donnell pain, urinary symptoms, abnormal bowel movements.  He reports a history of palpitations many decades ago for which he wore a Holter monitor which was negative.  He had a cath in 2008 which is clean.  Since then, he has not been evaluated by cardiology.  He reports a history of hypertension and gout for which he takes medication, no other medical problems.  He used to be on medicine for diabetes, however after losing a lot of weight no longer needs medication.  He denies change in diet or caffeine intake.  He denies recent medication changes.  No supplements.  He denies tobacco, alcohol, or drug use.  He is on aspirin daily, no other blood thinners.  HPI     Past Medical History:  Diagnosis Date  . Diabetes mellitus without complication ([place])   . GERD (gastroesophageal reflux disease)   . Gout   . Hypertension   . Low testosterone   . Sleep apnea     There are no problems to display for this patient.   Past Surgical History:  Procedure Laterality Date  . CARDIAC CATHETERIZATION    . COLONOSCOPY    . COLONOSCOPY N/A 05/11/2015   Procedure: COLONOSCOPY;  Surgeon: Rogene Houston, MD;   Location: [facility];  Service: Endoscopy;  Laterality: N/A;  930 - moved to 2/15 - Ann to notify       Family History  Problem Relation Age of Onset  . Colon cancer Father     Social History   Tobacco Use  . Smoking status: Never Smoker  . Smokeless tobacco: Never Used  Vaping Use  . Vaping Use: Never used  Substance Use Topics  . Alcohol use: Yes    Comment: occasionally  . Drug use: No    Home Medications Prior to Admission medications   Medication Sig Start Date End Date Taking? Authorizing Provider  allopurinol (ZYLOPRIM) 300 MG tablet Take 300 mg by mouth every evening.  09/06/19   [provider]  aspirin EC 81 MG tablet Take 162 mg by mouth every evening. Swallow whole.    [provider]  colchicine 0.6 MG tablet Take 0.6 mg by mouth 2 (two) times daily as needed (gout flare up).  05/16/19   [provider]  dicyclomine (BENTYL) 20 MG tablet Take 1 tablet (20 mg total) by mouth 3 (three) times daily as needed for spasms (abdominal cramping). 09/24/19   Long, Wonda Olds, MD  lisinopril (ZESTRIL) 10 MG tablet Take 10 mg by mouth every evening.    [provider]  Magnesium 100 MG CAPS Take 100 mg by mouth every evening.    [provider]  pantoprazole (PROTONIX) 40 MG tablet Take 1 tablet (40 mg total) by mouth daily. 09/24/19   Long, Wonda Olds, MD  Potassium 99 MG TABS Take 99 mg by mouth every evening.    [provider]  tadalafil (CIALIS) 20 MG tablet Take 20 mg by mouth daily as needed for erectile dysfunction.    [provider]  testosterone cypionate (DEPOTESTOSTERONE CYPIONATE) 200 MG/ML injection Inject 200 mg into the muscle every 14 (fourteen) days.    [provider]  tetrahydrozoline-zinc (VISINE-AC) 0.05-0.25 % ophthalmic solution Place 2 drops into both eyes 3 (three) times daily as needed (dry eyes).    [provider]    Allergies    Penicillins and Levaquin  [levofloxacin]  Review of Systems   Review of Systems  Respiratory: Positive for chest tightness (intermittent, none currently).   Cardiovascular: Positive for palpitations (intermittent, none currently).  All other systems reviewed and are negative.   Physical Exam Updated Vital Signs BP 114/90 (BP Location: Right Arm)   Pulse (!) 160   Temp 98.4 F (36.9 C) (Oral)   Resp 20   SpO2 97%   Physical Exam Vitals and nursing note reviewed.  Constitutional:      General: He is not in acute distress.    Appearance: He is well-developed. He is obese.     Comments: Resting in the bed in no acute distress  HENT:     Head: Normocephalic and atraumatic.  Eyes:     Extraocular Movements: Extraocular movements intact.     Conjunctiva/sclera: Conjunctivae normal.     Pupils: Pupils are equal, round, and reactive to light.  Cardiovascular:     Rate and Rhythm: Tachycardia present. Rhythm irregular.     Pulses: Normal pulses.     Comments: irreg irreg with HR around 120 Pulmonary:     Effort: Pulmonary effort is normal. No respiratory distress.     Breath sounds: Normal breath sounds. No wheezing.  Abdominal:     General: There is no distension.     Palpations: Abdomen is soft. There is no mass.     Tenderness: There is no abdominal tenderness. There is no guarding or rebound.  Musculoskeletal:        General: Normal range of motion.     Cervical back: Normal range of motion and neck supple.     Right lower leg: No edema.     Left lower leg: No edema.  Skin:    General: Skin is warm and dry.     Capillary Refill: Capillary refill takes less than 2 seconds.  Neurological:     Mental Status: He is alert and oriented to person, place, and time.     ED Results / Procedures / Treatments   Labs (all labs ordered are listed, but only abnormal results are displayed) Labs Reviewed  CBC - Abnormal; Notable for the following components:      Result Value   WBC 11.0 (*)    Hemoglobin  17.1 (*)    All other components within normal limits  RESPIRATORY PANEL BY RT PCR (FLU A&B, COVID)  BASIC METABOLIC PANEL  MAGNESIUM  TSH  TROPONIN I (HIGH SENSITIVITY)    EKG None  Radiology DG Chest 2 View  Result Date: 12/25/2019 CLINICAL DATA:  Palpitations.  Shortness of breath. EXAM: CHEST - 2 VIEW COMPARISON:  None. FINDINGS: The cardiomediastinal contours are normal. Subsegmental opacity in the right middle lobe typical of atelectasis. Pulmonary vasculature is normal. No confluent  consolidation, pleural effusion, or pneumothorax. No acute osseous abnormalities are seen. Multilevel degenerative change in the spine. IMPRESSION: Subsegmental right middle lobe atelectasis. Electronically Signed   By: Keith Rake M.D.   On: 12/25/2019 22:31    Procedures Procedures (including critical care time)  Medications Ordered in ED Medications  diltiazem (CARDIZEM) 1 mg/mL load via infusion 15 mg (has no administration in time range)    And  diltiazem (CARDIZEM) 125 mg in dextrose 5% 125 mL (1 mg/mL) infusion (has no administration in time range)    ED Course  I have reviewed the triage vital signs and the nursing notes.  Pertinent labs & imaging results that were available during my care of the patient were reviewed by me and considered in my medical decision making (see chart for details).    MDM Rules/Calculators/A&P                          Patient presenting for evaluation of intermittent palpitations and chest tightness over the past several weeks.  He is not having any symptoms on my exam.  However on evaluation, patient is tachycardic with an irregularly irregular heart rate.  Concern that he may be in A. fib with RVR, EKG is pending.  As there is an unknown start time as to when his symptoms began, he does not qualify for cardioversion as his vitals are stable.  No history of A. fib, he will need evaluation with blood work to look for electrolyte or thyroid abnormalities.   X-ray obtained from triage viewed and interpreted by me, no obvious pneumonia.  Per radiology, apparent atelectasis in the right middle lobe. Case discussed with attending, Dr. Sabra Heck evaluated the pt.   EKG from triage shows a fib with rvr with rate of 160. Repeat EKG after pt is in the room shows sinus tach at 100. As pt has worsened sxs with walking, will walk pt to see rhythm.   I ambulated with pt, he became tachy up tot 130, but remained in sinus tach. Will start fluids, dilt, and heparin. Pt will need to be admitted for his new onset a fib. As pt is currently in sinus tach, with worsening tachycardia with ambulation, consider PE. Will order CTA.   Pt signed out to [facility], PA-C for f/u on CTA, labs, and likely admit.   Final Clinical Impression(s) / ED Diagnoses Final diagnoses:  None    Rx / DC Orders ED Discharge Orders    None       Franchot Heidelberg, PA-C 12/25/19 2325    Noemi Chapel, MD 12/27/19 [phone]

## 2019-12-25 NOTE — ED Provider Notes (Signed)
Patient presents with rapid ventricular rate approximately 160 bpm on arrival complaining of palpitations and chest discomfort.  No history of A. fib, no history of ischemic heart disease, no history of thyroid disorder and denies any other symptoms of hyperthyroidism.  He does not drink alcohol or smoke cigarettes and only drinks a small amount of caffeine a day.  On exam the patient has tachycardia with a rate of approximately 115 with normal pulses at the radial arteries no peripheral edema and no JVD.  Lung sounds are clear, heart sounds are regular, no obvious murmurs.  EKG interpreted by myself, performed at 10:00 PM.  Performed on December 25, 2019. Rate 163 bpm, rhythm atrial fibrillation with rapid ventricular response, axis indeterminate, ST segments normal, T waves normal, no signs of left ventricular hypertrophy.  Abnormal EKG.  The patient's repeat EKG was performed at 11:12 PM on December 25, 2019 and shows what appears to be sinus tachycardia with a rate of 111 bpm.  Again indeterminate axis, normal ST segments and T waves, no LVH.  Patient is likely having intermittent episodes of atrial fibrillation for unclear cause.  He will need labs, chest x-ray and likely need to be admitted for rate control and anticoagulation.  This patient is agreeable to the plan  Medical screening examination/treatment/procedure(s) were conducted as a shared visit with non-physician practitioner(s) and myself.  I personally evaluated the patient during the encounter.  Clinical Impression:   Final diagnoses:  Atrial fibrillation with rapid ventricular response ([place])  Palpitations         Noemi Chapel, MD 12/27/19 1507

## 2019-12-26 ENCOUNTER — Encounter (HOSPITAL_COMMUNITY): Payer: Self-pay | Admitting: Internal Medicine

## 2019-12-26 ENCOUNTER — Other Ambulatory Visit (HOSPITAL_COMMUNITY): Payer: PRIVATE HEALTH INSURANCE

## 2019-12-26 ENCOUNTER — Observation Stay (HOSPITAL_BASED_OUTPATIENT_CLINIC_OR_DEPARTMENT_OTHER): Payer: BC Managed Care – PPO

## 2019-12-26 ENCOUNTER — Observation Stay (HOSPITAL_COMMUNITY): Payer: BC Managed Care – PPO

## 2019-12-26 DIAGNOSIS — I4891 Unspecified atrial fibrillation: Secondary | ICD-10-CM | POA: Diagnosis present

## 2019-12-26 DIAGNOSIS — N183 Chronic kidney disease, stage 3 unspecified: Secondary | ICD-10-CM | POA: Diagnosis not present

## 2019-12-26 DIAGNOSIS — I10 Essential (primary) hypertension: Secondary | ICD-10-CM | POA: Diagnosis present

## 2019-12-26 DIAGNOSIS — E119 Type 2 diabetes mellitus without complications: Secondary | ICD-10-CM

## 2019-12-26 DIAGNOSIS — N179 Acute kidney failure, unspecified: Secondary | ICD-10-CM | POA: Diagnosis present

## 2019-12-26 LAB — D-DIMER, QUANTITATIVE: D-Dimer, Quant: 0.41 ug/mL-FEU (ref 0.00–0.50)

## 2019-12-26 LAB — ECHOCARDIOGRAM COMPLETE
Area-P 1/2: 4.41 cm2
Height: 71 in
S' Lateral: 3.2 cm
Weight: 4800 oz

## 2019-12-26 LAB — CBC
HCT: 47.7 % (ref 39.0–52.0)
Hemoglobin: 15.9 g/dL (ref 13.0–17.0)
MCH: 32.1 pg (ref 26.0–34.0)
MCHC: 33.3 g/dL (ref 30.0–36.0)
MCV: 96.2 fL (ref 80.0–100.0)
Platelets: 225 10*3/uL (ref 150–400)
RBC: 4.96 MIL/uL (ref 4.22–5.81)
RDW: 14.6 % (ref 11.5–15.5)
WBC: 9.3 10*3/uL (ref 4.0–10.5)
nRBC: 0 % (ref 0.0–0.2)

## 2019-12-26 LAB — URINALYSIS, ROUTINE W REFLEX MICROSCOPIC
Bilirubin Urine: NEGATIVE
Glucose, UA: NEGATIVE mg/dL
Hgb urine dipstick: NEGATIVE
Ketones, ur: NEGATIVE mg/dL
Leukocytes,Ua: NEGATIVE
Nitrite: NEGATIVE
Protein, ur: NEGATIVE mg/dL
Specific Gravity, Urine: 1.015 (ref 1.005–1.030)
pH: 5 (ref 5.0–8.0)

## 2019-12-26 LAB — HIV ANTIBODY (ROUTINE TESTING W REFLEX): HIV Screen 4th Generation wRfx: NONREACTIVE

## 2019-12-26 LAB — BASIC METABOLIC PANEL
Anion gap: 10 (ref 5–15)
BUN: 33 mg/dL — ABNORMAL HIGH (ref 6–20)
CO2: 21 mmol/L — ABNORMAL LOW (ref 22–32)
Calcium: 9 mg/dL (ref 8.9–10.3)
Chloride: 109 mmol/L (ref 98–111)
Creatinine, Ser: 1.71 mg/dL — ABNORMAL HIGH (ref 0.61–1.24)
GFR calc Af Amer: 53 mL/min — ABNORMAL LOW (ref >60)
GFR calc non Af Amer: 46 mL/min — ABNORMAL LOW (ref >60)
Glucose, Bld: 139 mg/dL — ABNORMAL HIGH (ref 70–99)
Potassium: 5 mmol/L (ref 3.5–5.1)
Sodium: 140 mmol/L (ref 135–145)

## 2019-12-26 LAB — TSH
TSH: 4.587 u[IU]/mL — ABNORMAL HIGH (ref 0.350–4.500)
TSH: 1.827 u[IU]/mL (ref 0.350–4.500)

## 2019-12-26 LAB — HEPARIN LEVEL (UNFRACTIONATED)
Heparin Unfractionated: 0.22 IU/mL — ABNORMAL LOW (ref 0.30–0.70)
Heparin Unfractionated: 0.22 IU/mL — ABNORMAL LOW (ref 0.30–0.70)

## 2019-12-26 LAB — T4, FREE: Free T4: 1.31 ng/dL — ABNORMAL HIGH (ref 0.61–1.12)

## 2019-12-26 LAB — RESPIRATORY PANEL BY RT PCR (FLU A&B, COVID)
Influenza A by PCR: NEGATIVE
Influenza B by PCR: NEGATIVE
SARS Coronavirus 2 by RT PCR: NEGATIVE

## 2019-12-26 LAB — TROPONIN I (HIGH SENSITIVITY): Troponin I (High Sensitivity): 10 ng/L (ref <18)

## 2019-12-26 MED ORDER — ONDANSETRON HCL 4 MG PO TABS: 4.0000 mg | ORAL_TABLET | Freq: Four times a day (QID) | ORAL | Status: DC | PRN

## 2019-12-26 MED ORDER — ASPIRIN EC 81 MG PO TBEC: 162.0000 mg | DELAYED_RELEASE_TABLET | Freq: Every evening | ORAL | Status: DC

## 2019-12-26 MED ORDER — ACETAMINOPHEN 325 MG PO TABS: 650.0000 mg | ORAL_TABLET | Freq: Four times a day (QID) | ORAL | Status: DC | PRN

## 2019-12-26 MED ORDER — PERFLUTREN LIPID MICROSPHERE
1.0000 mL | INTRAVENOUS | Status: AC | PRN
  Administered 2019-12-26: 2 mL via INTRAVENOUS
  Filled 2019-12-26: qty 10

## 2019-12-26 MED ORDER — ALLOPURINOL 300 MG PO TABS
300.0000 mg | ORAL_TABLET | Freq: Every evening | ORAL | Status: DC
  Filled 2019-12-26: qty 1

## 2019-12-26 MED ORDER — METOPROLOL TARTRATE 25 MG PO TABS
25.0000 mg | ORAL_TABLET | Freq: Two times a day (BID) | ORAL | 0 refills | Status: DC
Start: 2019-12-26 — End: 2019-12-26

## 2019-12-26 MED ORDER — PANTOPRAZOLE SODIUM 40 MG PO TBEC
40.0000 mg | DELAYED_RELEASE_TABLET | Freq: Every day | ORAL | Status: DC
  Administered 2019-12-26: 40 mg via ORAL
  Filled 2019-12-26: qty 1

## 2019-12-26 MED ORDER — METOPROLOL TARTRATE 25 MG PO TABS: 25.0000 mg | ORAL_TABLET | Freq: Two times a day (BID) | ORAL | 0 refills | Status: DC

## 2019-12-26 MED ORDER — METOPROLOL TARTRATE 25 MG PO TABS
25.0000 mg | ORAL_TABLET | Freq: Two times a day (BID) | ORAL | 0 refills | Status: DC
Start: 2019-12-26 — End: 2020-01-15

## 2019-12-26 MED ORDER — ACETAMINOPHEN 650 MG RE SUPP: 650.0000 mg | Freq: Four times a day (QID) | RECTAL | Status: DC | PRN

## 2019-12-26 MED ORDER — MAGNESIUM OXIDE 400 (241.3 MG) MG PO TABS: 200.0000 mg | ORAL_TABLET | Freq: Every evening | ORAL | Status: DC

## 2019-12-26 MED ORDER — APIXABAN 5 MG PO TABS
5.0000 mg | ORAL_TABLET | Freq: Two times a day (BID) | ORAL | Status: DC
  Administered 2019-12-26: 5 mg via ORAL
  Filled 2019-12-26: qty 1

## 2019-12-26 MED ORDER — ONDANSETRON HCL 4 MG/2ML IJ SOLN: 4.0000 mg | Freq: Four times a day (QID) | INTRAMUSCULAR | Status: DC | PRN

## 2019-12-26 MED ORDER — APIXABAN 5 MG PO TABS
5.0000 mg | ORAL_TABLET | Freq: Two times a day (BID) | ORAL | 0 refills | Status: DC
Start: 2019-12-26 — End: 2019-12-26

## 2019-12-26 MED ORDER — METOPROLOL TARTRATE 25 MG PO TABS
25.0000 mg | ORAL_TABLET | Freq: Two times a day (BID) | ORAL | Status: DC
  Administered 2019-12-26 (×2): 25 mg via ORAL
  Filled 2019-12-26 (×2): qty 1

## 2019-12-26 MED ORDER — APIXABAN 5 MG PO TABS
5.0000 mg | ORAL_TABLET | Freq: Two times a day (BID) | ORAL | 0 refills | Status: DC
Start: 2019-12-26 — End: 2020-01-06

## 2019-12-26 NOTE — ED Notes (Signed)
Pt transferred to hospital bed for comfort.

## 2019-12-26 NOTE — Discharge Summary (Signed)
Physician Discharge Summary  Linville Decarolis BMW:[id number] DOB: 1970-10-07 DOA: 12/25/2019  PCP: Monico Blitz, MD  Admit date: 12/25/2019 Discharge date: 12/26/2019  Admitted From: home Disposition:  home   Recommendations for Outpatient Follow-up:  1. F/u on thyroid function tests in a few weeks  Home Health:  none  Discharge Condition:  stable   CODE STATUS:  Full code   Diet recommendation:  Heart healthy Consultations:  cardiology  Procedures/Studies:  2 D ECHO   Discharge Diagnoses:  Principal Problem:   Atrial fibrillation with rapid ventricular response ([place]) Active Problems:   CKD 3   Gout   Obesity   Essential hypertension        Brief Summary:  Abelardo Seidner is a 49 y.o. male with history of hypertension, gout previous history of diabetes mellitus off medications after patient losing more than 75 pounds weight presents to the ER because of palpitation.  Patient states over the last 2 weeks patient has been having brief runs of palpitations without any particular provoking incident.  Tonight it became more persistent and decided to come to the ER.  When the palpitation happens patient does have some chest tightness denies any shortness of breath.  Hospital Course:  A-fib with RVR- new diagnosis - started on a Cardizem infusion and has converted to NSR - He has been started on Metoprolol for rate control- will hold Lisinopril - 2 d ECHO reveals LAE- no valvular abnormalities, normal EF - start Apixaban per cardiology recommendations- will hold aspirin (no h/o MI or CVA) - plan explained to patient and wife - TSH is slightly elevated at 4.587 yesterday- interestingly I have repeated it today and is 1.827 and Free T4 is 1.31. I have recommended that he have these rechecked as outpatient in a few weeks.  CKD3 - CR was 2.05 and is now 1.71 which is his baseline - GFR is 46   Discharge Exam: Vitals:   12/26/19 1100 12/26/19 1236  BP: 111/67 116/63   Pulse: 66 66  Resp: 17 18  Temp:  98.9 F (37.2 C)  SpO2: 99% 96%   Vitals:   12/26/19 0930 12/26/19 1000 12/26/19 1100 12/26/19 1236  BP: 106/63 105/71 111/67 116/63  Pulse: 72 71 66 66  Resp: 15 20 17 18   Temp:    98.9 F (37.2 C)  TempSrc:    Oral  SpO2: 95% 96% 99% 96%  Weight:    (!) 144.2 kg  Height:    5\' 11"  (1.803 m)    General: Pt is alert, awake, not in acute distress Cardiovascular: RRR, S1/S2 +, no rubs, no gallops Respiratory: CTA bilaterally, no wheezing, no rhonchi Abdominal: Soft, NT, ND, bowel sounds + Extremities: no edema, no cyanosis   Discharge Instructions  Discharge Instructions    Diet - low sodium heart healthy   Complete by: As directed    Increase activity slowly   Complete by: As directed      Allergies as of 12/26/2019      Reactions   Penicillins Other (See Comments)   Unknown childhood   Levaquin [levofloxacin] Palpitations      Medication List    STOP taking these medications   aspirin EC 81 MG tablet   lisinopril 10 MG tablet Commonly known as: ZESTRIL     TAKE these medications   allopurinol 300 MG tablet Commonly known as: ZYLOPRIM Take 300 mg by mouth every evening.   apixaban 5 MG Tabs tablet Commonly known as: ELIQUIS  Take 1 tablet (5 mg total) by mouth 2 (two) times daily.   colchicine 0.6 MG tablet Take 0.6 mg by mouth 2 (two) times daily as needed (gout flare up).   dicyclomine 20 MG tablet Commonly known as: BENTYL Take 1 tablet (20 mg total) by mouth 3 (three) times daily as needed for spasms (abdominal cramping).   Magnesium 100 MG Caps Take 100 mg by mouth every evening.   meloxicam 15 MG tablet Commonly known as: MOBIC Take 15 mg by mouth as needed for pain.   metoprolol tartrate 25 MG tablet Commonly known as: LOPRESSOR Take 1 tablet (25 mg total) by mouth 2 (two) times daily.   pantoprazole 40 MG tablet Commonly known as: PROTONIX Take 1 tablet (40 mg total) by mouth daily.   Potassium  99 MG Tabs Take 99 mg by mouth every evening.   tadalafil 20 MG tablet Commonly known as: CIALIS Take 20 mg by mouth daily as needed for erectile dysfunction.   testosterone cypionate 200 MG/ML injection Commonly known as: DEPOTESTOSTERONE CYPIONATE Inject 200 mg into the muscle every 14 (fourteen) days.   tetrahydrozoline-zinc 0.05-0.25 % ophthalmic solution Commonly known as: VISINE-AC Place 2 drops into both eyes 3 (three) times daily as needed (dry eyes).       Allergies  Allergen Reactions   Penicillins Other (See Comments)    Unknown childhood   Levaquin [Levofloxacin] Palpitations      DG Chest 2 View  Result Date: 12/25/2019 CLINICAL DATA:  Palpitations.  Shortness of breath. EXAM: CHEST - 2 VIEW COMPARISON:  None. FINDINGS: The cardiomediastinal contours are normal. Subsegmental opacity in the right middle lobe typical of atelectasis. Pulmonary vasculature is normal. No confluent consolidation, pleural effusion, or pneumothorax. No acute osseous abnormalities are seen. Multilevel degenerative change in the spine. IMPRESSION: Subsegmental right middle lobe atelectasis. Electronically Signed   By: Keith Rake M.D.   On: 12/25/2019 22:31   ECHOCARDIOGRAM COMPLETE  Result Date: 12/26/2019    ECHOCARDIOGRAM REPORT   Patient Name:   SHOMARI SCICCHITANO Giovanetti Date of Exam: 12/26/2019 Medical Rec #:  [id number]            Height:       71.0 in Accession #:    [id number]           Weight:       300.0 lb Date of Birth:  1970-08-12             BSA:          2.505 m Patient Age:    37 years             BP:           104/64 mmHg Patient Gender: M                    HR:           65 bpm. Exam Location:  Inpatient Procedure: 2D Echo, Cardiac Doppler, Color Doppler and Intracardiac            Opacification Agent Indications:    I48.91* Unspeicified atrial fibrillation  History:        Patient has no prior history of Echocardiogram examinations.                 Risk Factors:Morbid Obesity.   Sonographer:    Merrie Roof RDCS Referring Phys: [place]  1. Extremely limited due to poor sound wave transmission; definity used; normal  LV systolic and diastolic function; moderate LAE.  2. Left ventricular ejection fraction, by estimation, is 55 to 60%. The left ventricle has normal function. The left ventricle has no regional wall motion abnormalities. Left ventricular diastolic parameters were normal.  3. Right ventricular systolic function is normal. The right ventricular size is normal.  4. Left atrial size was moderately dilated.  5. The mitral valve is normal in structure. No evidence of mitral valve regurgitation. No evidence of mitral stenosis.  6. The aortic valve is tricuspid. Aortic valve regurgitation is not visualized. No aortic stenosis is present. FINDINGS  Left Ventricle: Left ventricular ejection fraction, by estimation, is 55 to 60%. The left ventricle has normal function. The left ventricle has no regional wall motion abnormalities. Definity contrast agent was given IV to delineate the left ventricular  endocardial borders. The left ventricular internal cavity size was normal in size. There is no left ventricular hypertrophy. Left ventricular diastolic parameters were normal. Right Ventricle: The right ventricular size is normal.Right ventricular systolic function is normal. Left Atrium: Left atrial size was moderately dilated. Right Atrium: Right atrial size was normal in size. Pericardium: There is no evidence of pericardial effusion. Mitral Valve: The mitral valve is normal in structure. No evidence of mitral valve regurgitation. No evidence of mitral valve stenosis. Tricuspid Valve: The tricuspid valve is normal in structure. Tricuspid valve regurgitation is not demonstrated. No evidence of tricuspid stenosis. Aortic Valve: The aortic valve is tricuspid. Aortic valve regurgitation is not visualized. No aortic stenosis is present. Pulmonic Valve: The pulmonic valve was  not well visualized. Pulmonic valve regurgitation is not visualized. No evidence of pulmonic stenosis. Aorta: The aortic root is normal in size and structure. Venous: The inferior vena cava was not well visualized.  Additional Comments: Extremely limited due to poor sound wave transmission; definity used; normal LV systolic and diastolic function; moderate LAE.  LEFT VENTRICLE PLAX 2D LVIDd:         4.20 cm  Diastology LVIDs:         3.20 cm  LV e' medial:    9.68 cm/s LV PW:         1.10 cm  LV E/e' medial:  8.5 LV IVS:        1.10 cm  LV e' lateral:   12.40 cm/s LVOT diam:     2.40 cm  LV E/e' lateral: 6.6 LV SV:         77 LV SV Index:   31 LVOT Area:     4.52 cm  RIGHT VENTRICLE RV Basal diam:  4.10 cm RV Mid diam:    4.30 cm RV S prime:     10.40 cm/s TAPSE (M-mode): 2.2 cm LEFT ATRIUM            Index       RIGHT ATRIUM           Index LA diam:      4.40 cm  1.76 cm/m  RA Area:     31.30 cm LA Vol (A4C): 125.0 ml 49.90 ml/m RA Volume:   144.00 ml 57.48 ml/m  AORTIC VALVE LVOT Vmax:   81.20 cm/s LVOT Vmean:  56.000 cm/s LVOT VTI:    0.171 m  AORTA Ao Root diam: 3.20 cm MITRAL VALVE MV Area (PHT): 4.41 cm    SHUNTS MV Decel Time: 172 msec    Systemic VTI:  0.17 m MV E velocity: 82.30 cm/s  Systemic Diam: 2.40 cm MV A velocity:  74.60 cm/s MV E/A ratio:  1.10 Kirk Ruths MD Electronically signed by Kirk Ruths MD Signature Date/Time: 12/26/2019/12:27:10 PM    Final      The results of significant diagnostics from this hospitalization (including imaging, microbiology, ancillary and laboratory) are listed below for reference.     Microbiology: Recent Results (from the past 240 hour(s))  Respiratory Panel by RT PCR (Flu A&B, Covid) - Nasopharyngeal Swab     Status: None   Collection Time: 12/25/19 11:01 PM   Specimen: Nasopharyngeal Swab  Result Value Ref Range Status   SARS Coronavirus 2 by RT PCR NEGATIVE NEGATIVE Final    Comment: (NOTE) SARS-CoV-2 target nucleic acids are NOT  DETECTED.  The SARS-CoV-2 RNA is generally detectable in upper respiratoy specimens during the acute phase of infection. The lowest concentration of SARS-CoV-2 viral copies this assay can detect is 131 copies/mL. A negative result does not preclude SARS-Cov-2 infection and should not be used as the sole basis for treatment or other patient management decisions. A negative result may occur with  improper specimen collection/handling, submission of specimen other than nasopharyngeal swab, presence of viral mutation(s) within the areas targeted by this assay, and inadequate number of viral copies (<131 copies/mL). A negative result must be combined with clinical observations, patient history, and epidemiological information. The expected result is Negative.  Fact Sheet for Patients:  PinkCheek.be  Fact Sheet for Healthcare Providers:  GravelBags.it  This test is no t yet approved or cleared by the [place] FDA and  has been authorized for detection and/or diagnosis of SARS-CoV-2 by FDA under an Emergency Use Authorization (EUA). This EUA will remain  in effect (meaning this test can be used) for the duration of the COVID-19 declaration under Section 564(b)(1) of the Act, 21 U.S.C. section 360bbb-3(b)(1), unless the authorization is terminated or revoked sooner.     Influenza A by PCR NEGATIVE NEGATIVE Final   Influenza B by PCR NEGATIVE NEGATIVE Final    Comment: (NOTE) The Xpert Xpress SARS-CoV-2/FLU/RSV assay is intended as an aid in  the diagnosis of influenza from Nasopharyngeal swab specimens and  should not be used as a sole basis for treatment. Nasal washings and  aspirates are unacceptable for Xpert Xpress SARS-CoV-2/FLU/RSV  testing.  Fact Sheet for Patients: PinkCheek.be  Fact Sheet for Healthcare Providers: GravelBags.it  This test is not yet  approved or cleared by the [place] FDA and  has been authorized for detection and/or diagnosis of SARS-CoV-2 by  FDA under an Emergency Use Authorization (EUA). This EUA will remain  in effect (meaning this test can be used) for the duration of the  Covid-19 declaration under Section 564(b)(1) of the Act, 21  U.S.C. section 360bbb-3(b)(1), unless the authorization is  terminated or revoked. Performed at [facility], [place] [address] [place], [place] [address]      Labs: BNP (last 3 results) No results for input(s): BNP in the last 8760 hours. Basic Metabolic Panel: Recent Labs  Lab 12/25/19 2222 12/25/19 2300 12/26/19 0444  NA 139  --  140  K 5.0  --  5.0  CL 105  --  109  CO2 25  --  21*  GLUCOSE 126*  --  139*  BUN 38*  --  33*  CREATININE 2.05*  --  1.71*  CALCIUM 9.4  --  9.0  MG  --  2.0  --    Liver Function Tests: No results for input(s): AST, ALT, ALKPHOS, BILITOT, PROT,  ALBUMIN in the last 168 hours. No results for input(s): LIPASE, AMYLASE in the last 168 hours. No results for input(s): AMMONIA in the last 168 hours. CBC: Recent Labs  Lab 12/25/19 2222 12/26/19 0444  WBC 11.0* 9.3  HGB 17.1* 15.9  HCT 51.3 47.7  MCV 96.1 96.2  PLT 280 225   Cardiac Enzymes: No results for input(s): CKTOTAL, CKMB, CKMBINDEX, TROPONINI in the last 168 hours. BNP: Invalid input(s): POCBNP CBG: No results for input(s): GLUCAP in the last 168 hours. D-Dimer Recent Labs    12/26/19 0444  DDIMER 0.41   Hgb A1c No results for input(s): HGBA1C in the last 72 hours. Lipid Profile No results for input(s): CHOL, HDL, LDLCALC, TRIG, CHOLHDL, LDLDIRECT in the last 72 hours. Thyroid function studies Recent Labs    12/26/19 0952  TSH 1.827   Anemia work up No results for input(s): VITAMINB12, FOLATE, FERRITIN, TIBC, IRON, RETICCTPCT in the last 72 hours. Urinalysis    Component Value Date/Time   COLORURINE YELLOW 12/26/2019 0440   APPEARANCEUR CLEAR  12/26/2019 0440   LABSPEC 1.015 12/26/2019 0440   PHURINE 5.0 12/26/2019 0440   GLUCOSEU NEGATIVE 12/26/2019 0440   HGBUR NEGATIVE 12/26/2019 0440   BILIRUBINUR NEGATIVE 12/26/2019 0440   KETONESUR NEGATIVE 12/26/2019 0440   PROTEINUR NEGATIVE 12/26/2019 0440   NITRITE NEGATIVE 12/26/2019 0440   LEUKOCYTESUR NEGATIVE 12/26/2019 0440   Sepsis Labs Invalid input(s): PROCALCITONIN,  WBC,  LACTICIDVEN Microbiology Recent Results (from the past 240 hour(s))  Respiratory Panel by RT PCR (Flu A&B, Covid) - Nasopharyngeal Swab     Status: None   Collection Time: 12/25/19 11:01 PM   Specimen: Nasopharyngeal Swab  Result Value Ref Range Status   SARS Coronavirus 2 by RT PCR NEGATIVE NEGATIVE Final    Comment: (NOTE) SARS-CoV-2 target nucleic acids are NOT DETECTED.  The SARS-CoV-2 RNA is generally detectable in upper respiratoy specimens during the acute phase of infection. The lowest concentration of SARS-CoV-2 viral copies this assay can detect is 131 copies/mL. A negative result does not preclude SARS-Cov-2 infection and should not be used as the sole basis for treatment or other patient management decisions. A negative result may occur with  improper specimen collection/handling, submission of specimen other than nasopharyngeal swab, presence of viral mutation(s) within the areas targeted by this assay, and inadequate number of viral copies (<131 copies/mL). A negative result must be combined with clinical observations, patient history, and epidemiological information. The expected result is Negative.  Fact Sheet for Patients:  PinkCheek.be  Fact Sheet for Healthcare Providers:  GravelBags.it  This test is no t yet approved or cleared by the [place] FDA and  has been authorized for detection and/or diagnosis of SARS-CoV-2 by FDA under an Emergency Use Authorization (EUA). This EUA will remain  in effect (meaning  this test can be used) for the duration of the COVID-19 declaration under Section 564(b)(1) of the Act, 21 U.S.C. section 360bbb-3(b)(1), unless the authorization is terminated or revoked sooner.     Influenza A by PCR NEGATIVE NEGATIVE Final   Influenza B by PCR NEGATIVE NEGATIVE Final    Comment: (NOTE) The Xpert Xpress SARS-CoV-2/FLU/RSV assay is intended as an aid in  the diagnosis of influenza from Nasopharyngeal swab specimens and  should not be used as a sole basis for treatment. Nasal washings and  aspirates are unacceptable for Xpert Xpress SARS-CoV-2/FLU/RSV  testing.  Fact Sheet for Patients: PinkCheek.be  Fact Sheet for Healthcare Providers: GravelBags.it  This test is  not yet approved or cleared by the [place] and  has been authorized for detection and/or diagnosis of SARS-CoV-2 by  FDA under an Emergency Use Authorization (EUA). This EUA will remain  in effect (meaning this test can be used) for the duration of the  Covid-19 declaration under Section 564(b)(1) of the Act, 21  U.S.C. section 360bbb-3(b)(1), unless the authorization is  terminated or revoked. Performed at [facility], [place] [address], [place], [place] [address]      Time coordinating discharge in minutes: 65  SIGNED:   Debbe Odea, MD  [facility] 12/26/2019, 2:56 PM

## 2019-12-26 NOTE — Care Management (Signed)
Patient given 30 day free Eliquis card.  Script printed. Patient confirms that he will fill at an open pharmacy today as his pharmacy is closed.

## 2019-12-26 NOTE — Progress Notes (Signed)
[facility] for Heparin Indication: atrial fibrillation  Allergies  Allergen Reactions  . Penicillins Other (See Comments)    Unknown childhood  . Levaquin [Levofloxacin] Palpitations    Patient Measurements: Height: 5\' 11"  (180.3 cm) Weight: 136.1 kg (300 lb) IBW/kg (Calculated) : 75.3 Heparin Dosing Weight: 107 kg  Vital Signs: Temp: 98.4 F (36.9 C) (10/01 2205) Temp Source: Oral (10/01 2205) BP: 112/71 (10/02 0630) Pulse Rate: 80 (10/02 0630)  Labs: Recent Labs    12/25/19 2222 12/26/19 0444 12/26/19 0627  HGB 17.1* 15.9  --   HCT 51.3 47.7  --   PLT 280 225  --   HEPARINUNFRC  --   --  0.22*  CREATININE 2.05* 1.71*  --   TROPONINIHS 7 10  --     Estimated Creatinine Clearance: 72.8 mL/min (A) (by C-G formula based on SCr of 1.71 mg/dL (H)).   Assessment: 49 y.o. M presents with afib. To begin heparin per pharmacy. No AC PTA. CBC ok on admission.  Heparin level subtherapeutic (0.22) on gtt at 1700 units/hr. No issues with line or bleeding reported per RN.  Goal of Therapy:  Heparin level 0.3-0.7 units/ml Monitor platelets by anticoagulation protocol: Yes   Plan:  Increase heparin gtt to 1900 units/hr Will f/u heparin level in 6 hours  Sherlon Handing, PharmD, BCPS Please see amion for complete clinical pharmacist phone list 12/26/2019,7:22 AM

## 2019-12-26 NOTE — Progress Notes (Signed)
Pt's stable, dc home via wheelchair 

## 2019-12-26 NOTE — Discharge Instructions (Signed)
Information on my medicine - ELIQUIS (apixaban) 5 mg by mouth TWICE daily  This medication education was reviewed with me or my healthcare representative as part of my discharge preparation.   Why was Eliquis prescribed for you? Eliquis was prescribed for you to reduce the risk of a blood clot forming that can cause a stroke if you have a medical condition called atrial fibrillation (a type of irregular heartbeat).  What do You need to know about Eliquis ? Take your Eliquis TWICE DAILY - one tablet in the morning and one tablet in the evening with or without food. If you have difficulty swallowing the tablet whole please discuss with your pharmacist how to take the medication safely.  Take Eliquis exactly as prescribed by your doctor and DO NOT stop taking Eliquis without talking to the doctor who prescribed the medication.  Stopping may increase your risk of developing a stroke.  Refill your prescription before you run out.  After discharge, you should have regular check-up appointments with your healthcare provider that is prescribing your Eliquis.  In the future your dose may need to be changed if your kidney function or weight changes by a significant amount or as you get older.  What do you do if you miss a dose? If you miss a dose, take it as soon as you remember on the same day and resume taking twice daily.  Do not take more than one dose of ELIQUIS at the same time to make up a missed dose.  Important Safety Information A possible side effect of Eliquis is bleeding. You should call your healthcare provider right away if you experience any of the following: ? Bleeding from an injury or your nose that does not stop. ? Unusual colored urine (red or dark brown) or unusual colored stools (red or black). ? Unusual bruising for unknown reasons. ? A serious fall or if you hit your head (even if there is no bleeding).  Some medicines may interact with Eliquis and might increase your  risk of bleeding or clotting while on Eliquis. To help avoid this, consult your healthcare provider or pharmacist prior to using any new prescription or non-prescription medications, including herbals, vitamins, non-steroidal anti-inflammatory drugs (NSAIDs) and supplements.  This website has more information on Eliquis (apixaban): http://www.eliquis.com/eliquis/home   Heart-Healthy Eating Plan Heart-healthy meal planning includes:  Eating less unhealthy fats.  Eating more healthy fats.  Making other changes in your diet. Talk with your doctor or a diet specialist (dietitian) to create an eating plan that is right for you. What is my plan? Your doctor may recommend an eating plan that includes:  Total fat: ______% or less of total calories a day.  Saturated fat: ______% or less of total calories a day.  Cholesterol: less than _________mg a day. What are tips for following this plan? Cooking Avoid frying your food. Try to bake, boil, grill, or broil it instead. You can also reduce fat by:  Removing the skin from poultry.  Removing all visible fats from meats.  Steaming vegetables in water or broth. Meal planning   At meals, divide your plate into four equal parts: ? Fill one-half of your plate with vegetables and green salads. ? Fill one-fourth of your plate with whole grains. ? Fill one-fourth of your plate with lean protein foods.  Eat 4-5 servings of vegetables per day. A serving of vegetables is: ? 1 cup of raw or cooked vegetables. ? 2 cups of raw leafy greens.  Eat 4-5 servings of fruit per day. A serving of fruit is: ? 1 medium whole fruit. ?  cup of dried fruit. ?  cup of fresh, frozen, or canned fruit. ?  cup of 100% fruit juice.  Eat more foods that have soluble fiber. These are apples, broccoli, carrots, beans, peas, and barley. Try to get 20-30 g of fiber per day.  Eat 4-5 servings of nuts, legumes, and seeds per week: ? 1 serving of dried beans or  legumes equals  cup after being cooked. ? 1 serving of nuts is  cup. ? 1 serving of seeds equals 1 tablespoon. General information  Eat more home-cooked food. Eat less restaurant, buffet, and fast food.  Limit or avoid alcohol.  Limit foods that are high in starch and sugar.  Avoid fried foods.  Lose weight if you are overweight.  Keep track of how much salt (sodium) you eat. This is important if you have high blood pressure. Ask your doctor to tell you more about this.  Try to add vegetarian meals each week. Fats  Choose healthy fats. These include olive oil and canola oil, flaxseeds, walnuts, almonds, and seeds.  Eat more omega-3 fats. These include salmon, mackerel, sardines, tuna, flaxseed oil, and ground flaxseeds. Try to eat fish at least 2 times each week.  Check food labels. Avoid foods with trans fats or high amounts of saturated fat.  Limit saturated fats. ? These are often found in animal products, such as meats, butter, and cream. ? These are also found in plant foods, such as palm oil, palm kernel oil, and coconut oil.  Avoid foods with partially hydrogenated oils in them. These have trans fats. Examples are stick margarine, some tub margarines, cookies, crackers, and other baked goods. What foods can I eat? Fruits All fresh, canned (in natural juice), or frozen fruits. Vegetables Fresh or frozen vegetables (raw, steamed, roasted, or grilled). Green salads. Grains Most grains. Choose whole wheat and whole grains most of the time. Rice and pasta, including brown rice and pastas made with whole wheat. Meats and other proteins Lean, well-trimmed beef, veal, pork, and lamb. Chicken and [place] without skin. All fish and shellfish. Wild duck, rabbit, pheasant, and venison. Egg whites or low-cholesterol egg substitutes. Dried beans, peas, lentils, and tofu. Seeds and most nuts. Dairy Low-fat or nonfat cheeses, including ricotta and mozzarella. Skim or 1% milk that is  liquid, powdered, or evaporated. Buttermilk that is made with low-fat milk. Nonfat or low-fat yogurt. Fats and oils Non-hydrogenated (trans-free) margarines. Vegetable oils, including soybean, sesame, sunflower, olive, peanut, safflower, corn, canola, and cottonseed. Salad dressings or mayonnaise made with a vegetable oil. Beverages Mineral water. Coffee and tea. Diet carbonated beverages. Sweets and desserts Sherbet, gelatin, and fruit ice. Small amounts of dark chocolate. Limit all sweets and desserts. Seasonings and condiments All seasonings and condiments. The items listed above may not be a complete list of foods and drinks you can eat. Contact a dietitian for more options. What foods should I avoid? Fruits Canned fruit in heavy syrup. Fruit in cream or butter sauce. Fried fruit. Limit coconut. Vegetables Vegetables cooked in cheese, cream, or butter sauce. Fried vegetables. Grains Breads that are made with saturated or trans fats, oils, or whole milk. Croissants. Sweet rolls. Donuts. High-fat crackers, such as cheese crackers. Meats and other proteins Fatty meats, such as hot dogs, ribs, sausage, bacon, rib-eye roast or steak. High-fat deli meats, such as salami and bologna. Caviar. Domestic duck and goose. Organ  meats, such as liver. Dairy Cream, sour cream, cream cheese, and creamed cottage cheese. Whole-milk cheeses. Whole or 2% milk that is liquid, evaporated, or condensed. Whole buttermilk. Cream sauce or high-fat cheese sauce. Yogurt that is made from whole milk. Fats and oils Meat fat, or shortening. Cocoa butter, hydrogenated oils, palm oil, coconut oil, palm kernel oil. Solid fats and shortenings, including bacon fat, salt pork, lard, and butter. Nondairy cream substitutes. Salad dressings with cheese or sour cream. Beverages Regular sodas and juice drinks with added sugar. Sweets and desserts Frosting. Pudding. Cookies. Cakes. Pies. Milk chocolate or white chocolate.  Buttered syrups. Full-fat ice cream or ice cream drinks. The items listed above may not be a complete list of foods and drinks to avoid. Contact a dietitian for more information. Summary  Heart-healthy meal planning includes eating less unhealthy fats, eating more healthy fats, and making other changes in your diet.  Eat a balanced diet. This includes fruits and vegetables, low-fat or nonfat dairy, lean protein, nuts and legumes, whole grains, and heart-healthy oils and fats. This information is not intended to replace advice given to you by your health care provider. Make sure you discuss any questions you have with your health care provider. Document Revised: 05/16/2017 Document Reviewed: 04/19/2017 Elsevier Patient Education  2020 [employer].

## 2019-12-26 NOTE — Progress Notes (Signed)
[place] for Apixaban Indication: atrial fibrillation  Allergies  Allergen Reactions  . Penicillins Other (See Comments)    Unknown childhood  . Levaquin [Levofloxacin] Palpitations   Patient Measurements: Height: 5\' 11"  (180.3 cm) Weight: (!) 144.2 kg (317 lb 14.5 oz) IBW/kg (Calculated) : 75.3  Vital Signs: Temp: 98.9 F (37.2 C) (10/02 1236) Temp Source: Oral (10/02 1236) BP: 116/63 (10/02 1236) Pulse Rate: 66 (10/02 1236)  Labs: Recent Labs    12/25/19 2222 12/26/19 0444 12/26/19 0627  HGB 17.1* 15.9  --   HCT 51.3 47.7  --   PLT 280 225  --   HEPARINUNFRC  --   --  0.22*  CREATININE 2.05* 1.71*  --   TROPONINIHS 7 10  --    Estimated Creatinine Clearance: 75.2 mL/min (A) (by C-G formula based on SCr of 1.71 mg/dL (H)).  Medical History: Past Medical History:  Diagnosis Date  . Diabetes mellitus without complication ([place])   . GERD (gastroesophageal reflux disease)   . Gout   . Hypertension   . Low testosterone   . Sleep apnea     Assessment: 49 y.o. M presents with afib. No AC PTA. Was initially started on heparin, pharmacy now consulted to switch heparin to apixaban. Weight is 144 kg but DOAC appropriate to initiate. CBC stable, wnl.    Goal of Therapy:  Monitor platelets by anticoagulation protocol: Yes   Plan:  Discontinue heparin Initiate apixaban 5 mg BID Monitor CBC, s/sx bleeding   Rebbeca Paul, PharmD PGY1 Pharmacy Resident 12/26/2019 2:50 PM  Please check [email or web address] for unit-specific pharmacy phone numbers.

## 2019-12-26 NOTE — ED Notes (Signed)
admit Provider at bedside. 

## 2019-12-26 NOTE — H&P (Signed)
History and Physical    Albert [name] FUX:[id number] DOB: 08-Feb-1971 DOA: 12/25/2019  PCP: Monico Blitz, MD  Patient coming from: Home.  Chief Complaint: Palpitations.  HPI: Albert [name] is a 49 y.o. male with history of hypertension, gout previous history of diabetes mellitus off medications after patient losing more than 75 pounds weight presents to the ER because of palpitation.  Patient states over the last 2 weeks patient has been having brief runs of palpitations without any particular provoking incident.  Tonight it became more persistent and decided to come to the ER.  When the palpitation happens patient does have some chest tightness denies any shortness of breath.  ED Course: In the ER patient was initially found to be in A. fib with RVR which is new for the patient.  Patient was started on Cardizem infusion and heparin infusion.  Patient converted to sinus tachycardia.  Chest x-ray was unremarkable Covid test negative TSH was around 4.  Patient admitted for further observation.  Labs show elevated creatinine around 2 patient's baseline is around 1.73 months ago.  Review of Systems: As per HPI, rest all negative.   Past Medical History:  Diagnosis Date  . Diabetes mellitus without complication ([place])   . GERD (gastroesophageal reflux disease)   . Gout   . Hypertension   . Low testosterone   . Sleep apnea     Past Surgical History:  Procedure Laterality Date  . CARDIAC CATHETERIZATION    . COLONOSCOPY    . COLONOSCOPY N/A 05/11/2015   Procedure: COLONOSCOPY;  Surgeon: Rogene Houston, MD;  Location: [facility];  Service: Endoscopy;  Laterality: N/A;  930 - moved to 2/15 - Ann to notify     reports that he has never smoked. He has never used smokeless tobacco. He reports current alcohol use. He reports that he does not use drugs.  Allergies  Allergen Reactions  . Penicillins Other (See Comments)    Unknown childhood  . Levaquin [Levofloxacin]  Palpitations    Family History  Problem Relation Age of Onset  . Colon cancer Father     Prior to Admission medications   Medication Sig Start Date End Date Taking? Authorizing Provider  allopurinol (ZYLOPRIM) 300 MG tablet Take 300 mg by mouth every evening.  09/06/19   [provider]  aspirin EC 81 MG tablet Take 162 mg by mouth every evening. Swallow whole.    [provider]  colchicine 0.6 MG tablet Take 0.6 mg by mouth 2 (two) times daily as needed (gout flare up).  05/16/19   [provider]  dicyclomine (BENTYL) 20 MG tablet Take 1 tablet (20 mg total) by mouth 3 (three) times daily as needed for spasms (abdominal cramping). 09/24/19   Long, Wonda Olds, MD  lisinopril (ZESTRIL) 10 MG tablet Take 10 mg by mouth every evening.    [provider]  Magnesium 100 MG CAPS Take 100 mg by mouth every evening.    [provider]  pantoprazole (PROTONIX) 40 MG tablet Take 1 tablet (40 mg total) by mouth daily. 09/24/19   Long, Wonda Olds, MD  Potassium 99 MG TABS Take 99 mg by mouth every evening.    [provider]  tadalafil (CIALIS) 20 MG tablet Take 20 mg by mouth daily as needed for erectile dysfunction.    [provider]  testosterone cypionate (DEPOTESTOSTERONE CYPIONATE) 200 MG/ML injection Inject 200 mg into the muscle every 14 (fourteen) days.  [provider]  tetrahydrozoline-zinc (VISINE-AC) 0.05-0.25 % ophthalmic solution Place 2 drops into both eyes 3 (three) times daily as needed (dry eyes).    [provider]    Physical Exam: Constitutional: Moderately built and nourished. Vitals:   12/26/19 0200 12/26/19 0215 12/26/19 0230 12/26/19 0330  BP: 113/86 113/67 117/67 (!) 109/52  Pulse: 92 84 83 80  Resp: (!) 21 (!) 22 (!) 22 (!) 24  Temp:      TempSrc:      SpO2: 98% 94% 94% 91%  Weight:      Height:       Eyes: Anicteric no pallor. ENMT: No discharge from the ears eyes nose or mouth. Neck: No  mass felt.  No neck rigidity. Respiratory: No rhonchi or crepitations. Cardiovascular: S1-S2 heard. Abdomen: Soft nontender bowel sounds present. Musculoskeletal: No edema. Skin: No rash. Neurologic: Alert awake oriented to time place and person.  Moves all extremities. Psychiatric: Appears normal.  Normal affect.   Labs on Admission: I have personally reviewed following labs and imaging studies  CBC: Recent Labs  Lab 12/25/19 2222  WBC 11.0*  HGB 17.1*  HCT 51.3  MCV 96.1  PLT 388   Basic Metabolic Panel: Recent Labs  Lab 12/25/19 2222 12/25/19 2300  NA 139  --   K 5.0  --   CL 105  --   CO2 25  --   GLUCOSE 126*  --   BUN 38*  --   CREATININE 2.05*  --   CALCIUM 9.4  --   MG  --  2.0   GFR: Estimated Creatinine Clearance: 60.7 mL/min (A) (by C-G formula based on SCr of 2.05 mg/dL (H)). Liver Function Tests: No results for input(s): AST, ALT, ALKPHOS, BILITOT, PROT, ALBUMIN in the last 168 hours. No results for input(s): LIPASE, AMYLASE in the last 168 hours. No results for input(s): AMMONIA in the last 168 hours. Coagulation Profile: No results for input(s): INR, PROTIME in the last 168 hours. Cardiac Enzymes: No results for input(s): CKTOTAL, CKMB, CKMBINDEX, TROPONINI in the last 168 hours. BNP (last 3 results) No results for input(s): PROBNP in the last 8760 hours. HbA1C: No results for input(s): HGBA1C in the last 72 hours. CBG: No results for input(s): GLUCAP in the last 168 hours. Lipid Profile: No results for input(s): CHOL, HDL, LDLCALC, TRIG, CHOLHDL, LDLDIRECT in the last 72 hours. Thyroid Function Tests: Recent Labs    12/25/19 2303  TSH 4.587*   Anemia Panel: No results for input(s): VITAMINB12, FOLATE, FERRITIN, TIBC, IRON, RETICCTPCT in the last 72 hours. Urine analysis:    Component Value Date/Time   COLORURINE YELLOW 09/24/2019 0935   APPEARANCEUR CLEAR 09/24/2019 0935   LABSPEC 1.010 09/24/2019 0935   PHURINE 5.0 09/24/2019 0935     GLUCOSEU NEGATIVE 09/24/2019 0935   HGBUR NEGATIVE 09/24/2019 0935   BILIRUBINUR NEGATIVE 09/24/2019 0935   KETONESUR 5 (A) 09/24/2019 0935   PROTEINUR NEGATIVE 09/24/2019 0935   NITRITE NEGATIVE 09/24/2019 0935   LEUKOCYTESUR NEGATIVE 09/24/2019 0935   Sepsis Labs: @LABRCNTIP (procalcitonin:4,lacticidven:4) ) Recent Results (from the past 240 hour(s))  Respiratory Panel by RT PCR (Flu A&B, Covid) - Nasopharyngeal Swab     Status: None   Collection Time: 12/25/19 11:01 PM   Specimen: Nasopharyngeal Swab  Result Value Ref Range Status   SARS Coronavirus 2 by RT PCR NEGATIVE NEGATIVE Final    Comment: (NOTE) SARS-CoV-2 target nucleic acids are NOT DETECTED.  The SARS-CoV-2 RNA is generally detectable in  upper respiratoy specimens during the acute phase of infection. The lowest concentration of SARS-CoV-2 viral copies this assay can detect is 131 copies/mL. A negative result does not preclude SARS-Cov-2 infection and should not be used as the sole basis for treatment or other patient management decisions. A negative result may occur with  improper specimen collection/handling, submission of specimen other than nasopharyngeal swab, presence of viral mutation(s) within the areas targeted by this assay, and inadequate number of viral copies (<131 copies/mL). A negative result must be combined with clinical observations, patient history, and epidemiological information. The expected result is Negative.  Fact Sheet for Patients:  PinkCheek.be  Fact Sheet for Healthcare Providers:  GravelBags.it  This test is no t yet approved or cleared by the [place] FDA and  has been authorized for detection and/or diagnosis of SARS-CoV-2 by FDA under an Emergency Use Authorization (EUA). This EUA will remain  in effect (meaning this test can be used) for the duration of the COVID-19 declaration under Section 564(b)(1) of the Act,  21 U.S.C. section 360bbb-3(b)(1), unless the authorization is terminated or revoked sooner.     Influenza A by PCR NEGATIVE NEGATIVE Final   Influenza B by PCR NEGATIVE NEGATIVE Final    Comment: (NOTE) The Xpert Xpress SARS-CoV-2/FLU/RSV assay is intended as an aid in  the diagnosis of influenza from Nasopharyngeal swab specimens and  should not be used as a sole basis for treatment. Nasal washings and  aspirates are unacceptable for Xpert Xpress SARS-CoV-2/FLU/RSV  testing.  Fact Sheet for Patients: PinkCheek.be  Fact Sheet for Healthcare Providers: GravelBags.it  This test is not yet approved or cleared by the [place] FDA and  has been authorized for detection and/or diagnosis of SARS-CoV-2 by  FDA under an Emergency Use Authorization (EUA). This EUA will remain  in effect (meaning this test can be used) for the duration of the  Covid-19 declaration under Section 564(b)(1) of the Act, 21  U.S.C. section 360bbb-3(b)(1), unless the authorization is  terminated or revoked. Performed at [facility], [place] [address], [place], [place] [address]      Radiological Exams on Admission: DG Chest 2 View  Result Date: 12/25/2019 CLINICAL DATA:  Palpitations.  Shortness of breath. EXAM: CHEST - 2 VIEW COMPARISON:  None. FINDINGS: The cardiomediastinal contours are normal. Subsegmental opacity in the right middle lobe typical of atelectasis. Pulmonary vasculature is normal. No confluent consolidation, pleural effusion, or pneumothorax. No acute osseous abnormalities are seen. Multilevel degenerative change in the spine. IMPRESSION: Subsegmental right middle lobe atelectasis. Electronically Signed   By: Keith Rake M.D.   On: 12/25/2019 22:31    EKG: Independently reviewed.  Initial EKG done in the ER shows A. fib with RVR.  Second EKG shows sinus tachycardia.  Assessment/Plan Principal Problem:   Atrial  fibrillation with rapid ventricular response (HCC) Active Problems:   Essential hypertension   ARF (acute renal failure) (HCC)   Atrial fibrillation with RVR (HCC)    1. A. fib with RVR new onset converted to sinus tachycardia at this time will try to wean off Cardizem infusion after starting p.o. Cardizem.  Patient's chads 2 vasc score is around 1 for hypertension.  Patient used to be diabetic is off medication at this time.  Patient has been however started on heparin infusion which will continue for now check 2D echo troponins D-dimer consult cardiology. 2. Hypertension holding lisinopril due to worsening renal function presently on metoprolol. 3. Acute on chronic kidney disease  stage III cause unclear.  Check UA we will hold lisinopril and follow metabolic panel.  Did receive 1 L fluid bolus in the ER. 4. History of gout on allopurinol. 5. Previous history of diabetes mellitus type 2 off medication after patient losing 75 pounds.   DVT prophylaxis: Heparin infusion. Code Status: Full code. Family Communication: Discussed with patient. Disposition Plan: Home. Consults called: Cardiology. Admission status: Observation.   Rise Patience MD [facility] Pager [phone].  If 7PM-7AM, please contact night-coverage [email or web address] Password [facility]  12/26/2019, 3:38 AM

## 2019-12-26 NOTE — ED Notes (Signed)
SDU Breakfast Ordered 

## 2019-12-26 NOTE — Progress Notes (Addendum)
  Echocardiogram 2D Echocardiogram with contrast has been performed.  Albert [name] F 12/26/2019, 11:58 AM

## 2019-12-26 NOTE — Consult Note (Addendum)
Cardiology Consultation:   Patient ID: Ethen Bannan MRN: [id number]; DOB: Oct 13, 1970  Admit date: 12/25/2019 Date of Consult: 12/26/2019  Primary Care Provider: Monico Blitz, MD [facility] Cardiologist: Kirk Ruths, MD  [place] Electrophysiologist:  None    Patient Profile:   Albert [name] is a 49 y.o. male with a hx of DM, HTN, GERD, and OSA who is being seen today for the evaluation of AFib RVR at the request of Dr. Hal Hope.  History of Present Illness:   Albert [name] is a 49 yo male with PMH noted above.  Patient reports he was seen by cardiology back in 2007 and underwent cardiac catheterization which was normal.  He has not seen cardiology since that time.  He is followed by his PCP for hypertension, diabetes and OSA.  Denies any tobacco, alcohol or drug use.  Reports moderate caffeine intake.  States he previously had been on diabetic medications, but had lost weight and his A1c was dropped to around 5.  He has since stopped these medications.  States for the past 2 weeks he has had intermittent episodes of palpitations which last up to a minute at a time.  These come with rest or activity.  Seem to be brief in nature, sometimes associated with some shortness of breath.  Developed episode last night around 8 PM which was more sustained, and he felt short of breath.  He talked with his wife and presented to the ER for further evaluation.  Does report some occasional chest heaviness with these episodes which he rates a 1/10.   In the ED his labs showed stable electrolytes, creatinine 2.05, high-sensitivity troponin VII, WBC 11, hemoglobin 17, TSH 4.587.  COVID negative.  AG on arrival showed A. fib RVR 163 bpm.  He was brought back to an exam room, and follow-up EKG shortly afterwards showed he converted to sinus tachycardia 111 bpm.  Notes indicate he attempted to ambulate but became tachycardic with rates into the 130s.  He was started on IV fluids, dilt  drip and heparin.  Internal medicine was called for admission.  He has been weaned off Cardizem and heart rates remain in sinus around the 60 to 70 bpm.  Cardiology has been asked to consult.   Past Medical History:  Diagnosis Date  . Diabetes mellitus without complication ([place])   . GERD (gastroesophageal reflux disease)   . Gout   . Hypertension   . Low testosterone   . Sleep apnea     Past Surgical History:  Procedure Laterality Date  . CARDIAC CATHETERIZATION    . COLONOSCOPY    . COLONOSCOPY N/A 05/11/2015   Procedure: COLONOSCOPY;  Surgeon: Rogene Houston, MD;  [facility];  Service: Endoscopy;  Laterality: N/A;  930 - moved to 2/15 - Ann to notify     Home Medications:  Prior to Admission medications   Medication Sig Start Date End Date Taking? Authorizing Provider  allopurinol (ZYLOPRIM) 300 MG tablet Take 300 mg by mouth every evening.  09/06/19   [provider]  aspirin EC 81 MG tablet Take 162 mg by mouth every evening. Swallow whole.    [provider]  colchicine 0.6 MG tablet Take 0.6 mg by mouth 2 (two) times daily as needed (gout flare up).  05/16/19   [provider]  dicyclomine (BENTYL) 20 MG tablet Take 1 tablet (20 mg total) by mouth 3 (three) times daily as needed for spasms (abdominal cramping). 09/24/19  Long, Wonda Olds, MD  lisinopril (ZESTRIL) 10 MG tablet Take 10 mg by mouth every evening.    [provider]  Magnesium 100 MG CAPS Take 100 mg by mouth every evening.    [provider]  pantoprazole (PROTONIX) 40 MG tablet Take 1 tablet (40 mg total) by mouth daily. 09/24/19   Long, Wonda Olds, MD  Potassium 99 MG TABS Take 99 mg by mouth every evening.    [provider]  tadalafil (CIALIS) 20 MG tablet Take 20 mg by mouth daily as needed for erectile dysfunction.    [provider]  testosterone cypionate (DEPOTESTOSTERONE CYPIONATE) 200 MG/ML injection Inject 200 mg into the muscle every  14 (fourteen) days.    [provider]  tetrahydrozoline-zinc (VISINE-AC) 0.05-0.25 % ophthalmic solution Place 2 drops into both eyes 3 (three) times daily as needed (dry eyes).    [provider]    Inpatient Medications: Scheduled Meds: . allopurinol  300 mg Oral QPM  . aspirin EC  162 mg Oral QPM  . magnesium oxide  200 mg Oral QPM  . metoprolol tartrate  25 mg Oral BID  . pantoprazole  40 mg Oral Daily   Continuous Infusions: . diltiazem (CARDIZEM) infusion Stopped (12/26/19 0536)  . heparin 1,900 Units/hr (12/26/19 0758)   PRN Meds: acetaminophen **OR** acetaminophen, ondansetron **OR** ondansetron (ZOFRAN) IV  Allergies:    Allergies  Allergen Reactions  . Penicillins Other (See Comments)    Unknown childhood  . Levaquin [Levofloxacin] Palpitations    Social History:   Social History   Socioeconomic History  . Marital status: Married    Spouse name: Not on file  . Number of children: Not on file  . Years of education: Not on file  . Highest education level: Not on file  Occupational History  . Not on file  Tobacco Use  . Smoking status: Never Smoker  . Smokeless tobacco: Never Used  Vaping Use  . Vaping Use: Never used  Substance and Sexual Activity  . Alcohol use: Yes    Comment: occasionally  . Drug use: No  . Sexual activity: Not on file  Other Topics Concern  . Not on file  Social History Narrative  . Not on file   Social Determinants of Health   Financial Resource Strain:   . Difficulty of Paying Living Expenses: Not on file  Food Insecurity:   . Worried About Charity fundraiser in the Last Year: Not on file  . Ran Out of Food in the Last Year: Not on file  Transportation Needs:   . Lack of Transportation (Medical): Not on file  . Lack of Transportation (Non-Medical): Not on file  Physical Activity:   . Days of Exercise per Week: Not on file  . Minutes of Exercise per Session: Not on file  Stress:   . Feeling of Stress  : Not on file  Social Connections:   . Frequency of Communication with Friends and Family: Not on file  . Frequency of Social Gatherings with Friends and Family: Not on file  . Attends Religious Services: Not on file  . Active Member of Clubs or Organizations: Not on file  . Attends Archivist Meetings: Not on file  . Marital Status: Not on file  Intimate Partner Violence:   . Fear of Current or Ex-Partner: Not on file  . Emotionally Abused: Not on file  . Physically Abused: Not on file  . Sexually Abused: Not on  file    Family History:    Family History  Problem Relation Age of Onset  . Colon cancer Father      ROS:  Please see the history of present illness.   All other ROS reviewed and negative.     Physical Exam/Data:   Vitals:   12/26/19 0530 12/26/19 0600 12/26/19 0630 12/26/19 0730  BP: (!) 101/53 (!) 102/53 112/71 101/61  Pulse: 72 65 80 64  Resp: (!) 22 20 20 19   Temp:      TempSrc:      SpO2: 95% 98% 98% 98%  Weight:      Height:        Intake/Output Summary (Last 24 hours) at 12/26/2019 1015 Last data filed at 12/26/2019 0604 Gross per 24 hour  Intake 41.83 ml  Output 1200 ml  Net -1158.17 ml   Last 3 Weights 12/25/2019 09/24/2019 05/11/2015  Weight (lbs) 300 lb 300 lb 380 lb  Weight (kg) 136.079 kg 136.079 kg 172.367 kg     Body mass index is 41.84 kg/m.  General:  Well nourished, well developed, in no acute distress HEENT: normal Lymph: no adenopathy Neck: no JVD Endocrine:  No thryomegaly Vascular: No carotid bruits Cardiac:  normal S1, S2; RRR; no murmur  Lungs:  clear to auscultation bilaterally, no wheezing, rhonchi or rales  Abd: soft, nontender, no hepatomegaly  Ext: no edema Musculoskeletal:  No deformities, BUE and BLE strength normal and equal Skin: warm and dry  Neuro:  CNs 2-12 intact, no focal abnormalities noted Psych:  Normal affect   EKG:  The EKG was personally reviewed and demonstrates: A. fib RVR 163 bpm Sinus  tachycardia 111 bpm  Relevant CV Studies:  N/a   Laboratory Data:  High Sensitivity Troponin:   Recent Labs  Lab 12/25/19 2222 12/26/19 0444  TROPONINIHS 7 10     Chemistry Recent Labs  Lab 12/25/19 2222 12/26/19 0444  NA 139 140  K 5.0 5.0  CL 105 109  CO2 25 21*  GLUCOSE 126* 139*  BUN 38* 33*  CREATININE 2.05* 1.71*  CALCIUM 9.4 9.0  GFRNONAA 37* 46*  GFRAA 43* 53*  ANIONGAP 9 10    No results for input(s): PROT, ALBUMIN, AST, ALT, ALKPHOS, BILITOT in the last 168 hours. Hematology Recent Labs  Lab 12/25/19 2222 12/26/19 0444  WBC 11.0* 9.3  RBC 5.34 4.96  HGB 17.1* 15.9  HCT 51.3 47.7  MCV 96.1 96.2  MCH 32.0 32.1  MCHC 33.3 33.3  RDW 14.3 14.6  PLT 280 225   BNPNo results for input(s): BNP, PROBNP in the last 168 hours.  DDimer  Recent Labs  Lab 12/26/19 0444  DDIMER 0.41     Radiology/Studies:  DG Chest 2 View  Result Date: 12/25/2019 CLINICAL DATA:  Palpitations.  Shortness of breath. EXAM: CHEST - 2 VIEW COMPARISON:  None. FINDINGS: The cardiomediastinal contours are normal. Subsegmental opacity in the right middle lobe typical of atelectasis. Pulmonary vasculature is normal. No confluent consolidation, pleural effusion, or pneumothorax. No acute osseous abnormalities are seen. Multilevel degenerative change in the spine. IMPRESSION: Subsegmental right middle lobe atelectasis. Electronically Signed   By: Keith Rake M.D.   On: 12/25/2019 22:31    Assessment and Plan:   Albert [name] is a 49 y.o. male with a hx of DM, HTN, GERD, and OSA who is being seen today for the evaluation of AFib RVR at the request of Dr. Hal Hope.  1. Afib RVR: Patient presents  with intermittent episodes for the past 2 weeks of palpitations which are brief in nature, some associated shortness of breath at times.  Noted to be in A. fib RVR on arrival to the ED but converted to sinus rhythm prior to initiation of medication.  Currently on IV heparin. This  patients CHA2DS2-VASc Score of at least 2 for hypertension, has history of diabetes, but reports being off medication as his A1c was improved.  -- discussed [place] with the patient and stroke risk reduction. He is agreeable.  -- stop IV heparin, start Eliquis 5mg  BID -- echo pending read  2. HTN: reading are soft in the ED. He has been started on metoprolol 45m BID with home lisinopril held on admission. Consider restart home lisinopril with close monitoring given elevated Cr at baseline  3. DM: Reports being on medications in the past but Hgb A1c improved and no longer on therapy.  -- management per primary  4. OSA: reports compliance with home Cpap  Echo is pending read, if stable will plan for outpatient follow up in the Afib clinic  For questions or updates, please contact [facility] Please consult [email or web address] for contact info under    Signed, Reino Bellis, NP  12/26/2019 10:15 AM As above, patient seen and examined.  Briefly he is a 49 year old male with past medical history of diabetes mellitus (resolved after losing 75 to 100 pounds), hypertension, hyperlipidemia, Gastrosoft reflux disease, obstructive sleep apnea for evaluation of atrial fibrillation.  Over the past 2 weeks he has had intermittent palpitations that have been short-lived (in the 1 to 2-minute range).  No associated dyspnea, chest pain or syncope.  However his symptoms worsened yesterday.  He presented to the emergency room and was noted to be in atrial fibrillation with rapid ventricular response.  He has converted to sinus rhythm and cardiology now asked to evaluate.  Note he typically does not have significant dyspnea on exertion or exertional chest pain.  No history of syncope. Laboratories show creatinine 1.71, hemoglobin 15.9, TSH 4.587.  Electrocardiogram shows atrial fibrillation with rapid ventricular response converting to sinus rhythm with no ST changes.  1 paroxysmal atrial fibrillation-patient is back  in sinus rhythm.  We will treat with metoprolol 25 mg twice daily for rate control if atrial fibrillation recurs. CHADSvasc 2.  Add apixaban 5 mg twice daily.  Note TSH minimally elevated.  Await echocardiogram for LV function.  If normal patient can be discharged and we will arrange outpatient follow-up.  If he has more frequent episodes in the future we will consider antiarrhythmic versus referral for ablation.  2 hypertension-patient's blood pressure is borderline and we are adding metoprolol for rate control of atrial fibrillation.  Discontinue lisinopril and follow.  3 chronic stage III kidney disease-we will need follow-up as an outpatient.  4 obstructive sleep apnea-continue CPAP.  5 diabetes mellitus-follow-up primary care.  Kirk Ruths

## 2019-12-26 NOTE — ED Provider Notes (Signed)
Care assumed from PA-C Caccavale.  Please see her full H&P.  In short,  Albert [name] is a 49 y.o. male presents for palpitations.  Arrives in the emergency department with EKG that shows A. fib with RVR at a heart rate of 160.  Cardizem bolus and drip initiated along with heparin drip.  Sustained tachycardia after conversion to sinus.  Also has shortness of breath.   Physical Exam  BP 116/74 (BP Location: Left Arm)    Pulse (!) 101    Temp 98.4 F (36.9 C) (Oral)    Resp (!) 21    Ht 5\' 11"  (1.803 m)    Wt 136.1 kg    SpO2 97%    BMI 41.84 kg/m   Physical Exam Vitals and nursing note reviewed.  Constitutional:      General: He is not in acute distress.    Appearance: He is well-developed.  HENT:     Head: Normocephalic.  Eyes:     General: No scleral icterus.    Conjunctiva/sclera: Conjunctivae normal.  Cardiovascular:     Rate and Rhythm: Tachycardia present.  Pulmonary:     Effort: Pulmonary effort is normal.  Musculoskeletal:        General: Normal range of motion.     Cervical back: Normal range of motion.  Skin:    General: Skin is warm and dry.  Neurological:     Mental Status: He is alert.     ED Course/Procedures   Clinical Course as of Dec 26 11  Sat Dec 26, 2019  0012 Slightly elevated  TSH(!): 4.587 [HM]  0012 Elevated from baseline - 1.7 previously  Creatinine(!): 2.05 [HM]    Clinical Course User Index [HM] Twilla Khouri, Jarrett Soho, PA-C    .Critical Care Performed by: Abigail Butts, PA-C Authorized by: Abigail Butts, PA-C   Critical care provider statement:    Critical care time (minutes):  45   Critical care time was exclusive of:  Separately billable procedures and treating other patients and teaching time   Critical care was necessary to treat or prevent imminent or life-threatening deterioration of the following conditions:  Cardiac failure   Critical care was time spent personally by me on the following activities:   Discussions with consultants, evaluation of patient's response to treatment, examination of patient, ordering and performing treatments and interventions, ordering and review of laboratory studies, ordering and review of radiographic studies, pulse oximetry, re-evaluation of patient's condition, obtaining history from patient or surrogate and review of old charts   I assumed direction of critical care for this patient from another provider in my specialty: yes      MDM  Pt presents with new onset a-fib.    12:09 AM  Discussed with radiology - pt with elevated creatinine and decreased GFR.  Will hold on CTA and hydrate.  Pt is already on heparin for a-fib.  Will admit.    1:05 AM HR improved.  Pt without additional complaints. Pt will be admitted.   BP 116/72    Pulse 93    Temp 98.4 F (36.9 C) (Oral)    Resp 17    Ht 5\' 11"  (1.803 m)    Wt 136.1 kg    SpO2 98%    BMI 41.84 kg/m    1:16 AM Discussed with Dr. Hal Hope who will admit.      Hyrum Shaneyfelt, Gwenlyn Perking 12/26/19 [place], MD 12/26/19 [phone]

## 2019-12-26 NOTE — ED Notes (Signed)
ECHO at bedside.

## 2020-01-05 NOTE — Progress Notes (Signed)
Primary Care Physician: Monico Blitz, MD Primary Cardiologist: Dr Stanford Breed Primary Electrophysiologist: none Referring Physician: Dr Clifton James Loc Albert [name] is a 49 y.o. male with a history of DM, HTN, GERD, OSA, and paroxysmal atrial fibrillation who presents for consultation in Albert Albert [name]. Albert patient was initially diagnosed with atrial fibrillation 12/25/19 after presenting to Albert Albert [name] with symptoms of palpitations, SOB, and some chest heaviness. He states he had brief, intermittent palpitations for two weeks prior. Patient was started on Eliquis for a CHADS2VASC score of 2. He converted back to SR spontaneously in Albert Albert [name]. Since then, he has had some mild palpitations almost daily. He did have another extended episode of heart racing on 10/8 which resolved when he took his BB. He is compliant with his CPAP and denies any alcohol use.   Today, he denies symptoms of chest pain, shortness of breath, orthopnea, PND, lower extremity edema, dizziness, presyncope, syncope, bleeding, or neurologic sequela. Albert patient is tolerating medications without difficulties and is otherwise without complaint today.    Atrial Fibrillation Risk Factors:  he does have symptoms or diagnosis of sleep apnea. he is compliant with CPAP therapy. he does not have a history of rheumatic fever. he does not have a history of alcohol use. Albert patient does not have a history of early familial atrial fibrillation or other arrhythmias.  he has a BMI of Body mass index is 44.52 kg/m.Albert Albert [name] Filed Weights   01/06/20 0836  Weight: (!) 144.8 kg    Family History  Problem Relation Age of Onset  . Colon cancer Father      Atrial Fibrillation Management history:  Previous antiarrhythmic drugs: none Previous cardioversions: none Previous ablations: none CHADS2VASC score: 2 Anticoagulation history: Eliquis   Past Medical History:  Diagnosis Date  . Diabetes mellitus without  complication ([place])   . GERD (gastroesophageal reflux disease)   . Gout   . Hypertension   . Low testosterone   . Sleep apnea    Past Surgical History:  Procedure Laterality Date  . CARDIAC CATHETERIZATION    . COLONOSCOPY    . COLONOSCOPY N/A 05/11/2015   Procedure: COLONOSCOPY;  Surgeon: Rogene Houston, MD;  Location[facility];  Service: Endoscopy;  Laterality: N/A;  930 - moved to 2/15 - Ann to notify    Current Outpatient Medications  Medication Sig Dispense Refill  . allopurinol (ZYLOPRIM) 300 MG tablet Take 300 mg by mouth every evening.     Albert Albert [name] apixaban (ELIQUIS) 5 MG TABS tablet Take 1 tablet (5 mg total) by mouth 2 (two) times daily. 60 tablet 0  . colchicine 0.6 MG tablet Take 0.6 mg by mouth 2 (two) times daily as needed (gout flare up).     . Magnesium 200 MG TABS Taking 4 tablets by mouth daily- 800mg  total    . meloxicam (MOBIC) 15 MG tablet Take 15 mg by mouth as needed for pain.     . metoprolol tartrate (LOPRESSOR) 25 MG tablet Take 1 tablet (25 mg total) by mouth 2 (two) times daily. 60 tablet 0  . pantoprazole (PROTONIX) 40 MG tablet Take 1 tablet (40 mg total) by mouth daily. 30 tablet 0  . Potassium 99 MG TABS Take 99 mg by mouth every evening.    . rosuvastatin (CRESTOR) 5 MG tablet Take 5 mg by mouth once a week.     . tadalafil (CIALIS) 20 MG tablet Take 20 mg by mouth daily as needed  for erectile dysfunction.    Albert Albert [name] testosterone cypionate (DEPOTESTOSTERONE CYPIONATE) 200 MG/ML injection Inject 200 mg into Albert muscle every 14 (fourteen) days.    Albert Albert [name] tetrahydrozoline-zinc (VISINE-AC) 0.05-0.25 % ophthalmic solution Place 2 drops into both eyes 3 (three) times daily as needed (dry eyes).     No current facility-administered medications for this encounter.    Allergies  Allergen Reactions  . Penicillins Other (See Comments)    Unknown childhood  . Levaquin [Levofloxacin] Palpitations    Social History   Socioeconomic History  . Marital status: Married     Spouse name: Not on file  . Number of children: Not on file  . Years of education: Not on file  . Highest education level: Not on file  Occupational History  . Not on file  Tobacco Use  . Smoking status: Never Smoker  . Smokeless tobacco: Never Used  Vaping Use  . Vaping Use: Never used  Substance and Sexual Activity  . Alcohol use: Yes    Comment: occasionally  . Drug use: No  . Sexual activity: Not on file  Other Topics Concern  . Not on file  Social History Narrative  . Not on file   Social Determinants of Health   Financial Resource Strain:   . Difficulty of Paying Living Expenses: Not on file  Food Insecurity:   . Worried About Charity fundraiser in Albert Last Year: Not on file  . Ran Out of Food in Albert Last Year: Not on file  Transportation Needs:   . Lack of Transportation (Medical): Not on file  . Lack of Transportation (Non-Medical): Not on file  Physical Activity:   . Days of Exercise per Week: Not on file  . Minutes of Exercise per Session: Not on file  Stress:   . Feeling of Stress : Not on file  Social Connections:   . Frequency of Communication with Friends and Family: Not on file  . Frequency of Social Gatherings with Friends and Family: Not on file  . Attends Religious Services: Not on file  . Active Member of Clubs or Organizations: Not on file  . Attends Archivist Meetings: Not on file  . Marital Status: Not on file  Intimate Partner Violence:   . Fear of Current or Ex-Partner: Not on file  . Emotionally Abused: Not on file  . Physically Abused: Not on file  . Sexually Abused: Not on file     ROS- All systems are reviewed and negative except as per Albert HPI above.  Physical Exam: Vitals:   01/06/20 0836  BP: 128/78  Pulse: (!) 59  Weight: (!) 144.8 kg  Height: 5\' 11"  (1.803 m)    GEN- Albert patient is well appearing obese male, alert and oriented x 3 today.   Head- normocephalic, atraumatic Eyes-  Sclera clear, conjunctiva  pink Ears- hearing intact Oropharynx- clear Neck- supple  Lungs- Clear to ausculation bilaterally, normal work of breathing Heart- Regular rate and rhythm, no murmurs, rubs or gallops  GI- soft, NT, ND, + BS Extremities- no clubbing, cyanosis, or edema MS- no significant deformity or atrophy Skin- no rash or lesion Psych- euthymic mood, full affect Neuro- strength and sensation are intact  Wt Readings from Last 3 Encounters:  01/06/20 (!) 144.8 kg  12/26/19 (!) 144.2 kg  09/24/19 136.1 kg    EKG today demonstrates SB HR 59, PR 184, QRS 102, QTc 407  Echo 12/26/19 demonstrated  1. Extremely limited due to poor  sound wave transmission; definity used;  normal LV systolic and diastolic function; moderate LAE.  2. Left ventricular ejection fraction, by estimation, is 55 to 60%. Albert  left ventricle has normal function. Albert left ventricle has no regional  wall motion abnormalities. Left ventricular diastolic parameters were  normal.  3. Right ventricular systolic function is normal. Albert right ventricular  size is normal.  4. Left atrial size was moderately dilated.  5. Albert mitral valve is normal in structure. No evidence of mitral valve  regurgitation. No evidence of mitral stenosis.  6. Albert aortic valve is tricuspid. Aortic valve regurgitation is not  visualized. No aortic stenosis is present.   Epic records are reviewed at length today  CHA2DS2-VASc Score = 2  Albert patient's score is based upon: CHF History: 0 HTN History: 1 Diabetes History: 1 Stroke History: 0 Vascular Disease History: 0 Age Score: 0 Gender Score: 0      ASSESSMENT AND PLAN: 1. Paroxysmal Atrial Fibrillation (ICD10:  I48.0) Albert patient's CHA2DS2-VASc score is 2, indicating a 2.2% annual risk of stroke.   General education about afib provided and questions answered. We also discussed his stroke risk and Albert risks and benefits of anticoagulation. Continue Eliquis 5 mg BID Continue Lopressor 25 mg  BID We discussed therapeutic options including AAD and ablation. Will plan to start Multaq 400 mg BID. Recent Cmet reviewed.  He has lost ~100 lbs over Albert last two years. He is very compliant with lifestyle modification. I think he may be a good candidate for ablation if he fails Multaq.   2. Secondary Hypercoagulable State (ICD10:  D68.69) Albert patient is at significant risk for stroke/thromboembolism based upon his CHA2DS2-VASc Score of 2.  Continue Apixaban (Eliquis).   3. Obesity Body mass index is 44.52 kg/m. Lifestyle modification was discussed at length including regular exercise and weight reduction.  4. Obstructive sleep apnea Albert importance of adequate treatment of sleep apnea was discussed today in order to improve our ability to maintain sinus rhythm long term. Patient reports compliance with CPAP therapy.  5. HTN Stable, no changes today.   Follow up in Albert AF [name] next week.    [facility] [address] [place], [place] [address] [phone] 01/06/2020 8:48 AM

## 2020-01-06 ENCOUNTER — Other Ambulatory Visit: Payer: Self-pay

## 2020-01-06 ENCOUNTER — Ambulatory Visit (HOSPITAL_COMMUNITY)
Admission: RE | Admit: 2020-01-06 | Discharge: 2020-01-06 | Disposition: A | Discharge status: Home / Self Care | Payer: BC Managed Care – PPO | Source: Ambulatory Visit | Attending: Physician Assistant | Admitting: Physician Assistant

## 2020-01-06 VITALS — BP 128/78 | HR 59 | Ht 71.0 in | Wt 319.2 lb

## 2020-01-06 DIAGNOSIS — Z6841 Body Mass Index (BMI) 40.0 and over, adult: Secondary | ICD-10-CM | POA: Insufficient documentation

## 2020-01-06 DIAGNOSIS — I480 Paroxysmal atrial fibrillation: Secondary | ICD-10-CM | POA: Insufficient documentation

## 2020-01-06 DIAGNOSIS — I10 Essential (primary) hypertension: Secondary | ICD-10-CM | POA: Diagnosis not present

## 2020-01-06 DIAGNOSIS — E669 Obesity, unspecified: Secondary | ICD-10-CM | POA: Diagnosis not present

## 2020-01-06 DIAGNOSIS — M109 Gout, unspecified: Secondary | ICD-10-CM | POA: Diagnosis not present

## 2020-01-06 DIAGNOSIS — Z7901 Long term (current) use of anticoagulants: Secondary | ICD-10-CM | POA: Insufficient documentation

## 2020-01-06 DIAGNOSIS — K219 Gastro-esophageal reflux disease without esophagitis: Secondary | ICD-10-CM | POA: Insufficient documentation

## 2020-01-06 DIAGNOSIS — Z79899 Other long term (current) drug therapy: Secondary | ICD-10-CM | POA: Diagnosis not present

## 2020-01-06 DIAGNOSIS — D6869 Other thrombophilia: Secondary | ICD-10-CM | POA: Insufficient documentation

## 2020-01-06 DIAGNOSIS — E119 Type 2 diabetes mellitus without complications: Secondary | ICD-10-CM | POA: Insufficient documentation

## 2020-01-06 DIAGNOSIS — G4733 Obstructive sleep apnea (adult) (pediatric): Secondary | ICD-10-CM | POA: Diagnosis not present

## 2020-01-06 MED ORDER — MULTAQ 400 MG PO TABS: 400.0000 mg | ORAL_TABLET | Freq: Two times a day (BID) | ORAL | 3 refills | Status: DC

## 2020-01-06 MED ORDER — APIXABAN 5 MG PO TABS
5.0000 mg | ORAL_TABLET | Freq: Two times a day (BID) | ORAL | 3 refills | Status: DC
Start: 2020-01-06 — End: 2020-01-15

## 2020-01-06 NOTE — Patient Instructions (Signed)
Start Multaq 400mg twice a day WITH FOOD 

## 2020-01-12 ENCOUNTER — Ambulatory Visit (HOSPITAL_COMMUNITY): Payer: PRIVATE HEALTH INSURANCE | Admitting: Physician Assistant

## 2020-01-14 NOTE — Progress Notes (Signed)
Primary Care Physician: Monico Blitz, MD Primary Cardiologist: Dr Stanford Breed Primary Electrophysiologist: none Referring Physician: Dr Clifton James Ernst Albert [name] is a 49 y.o. male with a history of DM, HTN, GERD, OSA, and paroxysmal atrial fibrillation who presents for consultation in the [facility]. The patient was initially diagnosed with atrial fibrillation 12/25/19 after presenting to the ED with symptoms of palpitations, SOB, and some chest heaviness. He states he had brief, intermittent palpitations for two weeks prior. Patient was started on Eliquis for a CHADS2VASC score of 2. He converted back to SR spontaneously in the ED. Since then, he has had some mild palpitations almost daily. He did have another extended episode of heart racing on 10/8 which resolved when he took his BB. He is compliant with his CPAP and denies any alcohol use.   On follow up today, patient reports he has done well since his last visit. He has only had two very brief palpitations which lasted < 5 seconds. He is tolerating the Multaq without difficulty. He denies any bleeding issues on anticoagulation.   Today, he denies symptoms of chest pain, shortness of breath, orthopnea, PND, lower extremity edema, dizziness, presyncope, syncope, bleeding, or neurologic sequela. The patient is tolerating medications without difficulties and is otherwise without complaint today.    Atrial Fibrillation Risk Factors:  he does have symptoms or diagnosis of sleep apnea. he is compliant with CPAP therapy. he does not have a history of rheumatic fever. he does not have a history of alcohol use. The patient does not have a history of early familial atrial fibrillation or other arrhythmias.  he has a BMI of Body mass index is 44.77 kg/m.[name] Filed Weights   01/15/20 0833  Weight: (!) 145.6 kg    Family History  Problem Relation Age of Onset  . Colon cancer Father      Atrial Fibrillation  Management history:  Previous antiarrhythmic drugs: Multaq Previous cardioversions: none Previous ablations: none CHADS2VASC score: 2 Anticoagulation history: Eliquis   Past Medical History:  Diagnosis Date  . Diabetes mellitus without complication ([place])   . GERD (gastroesophageal reflux disease)   . Gout   . Hypertension   . Low testosterone   . Sleep apnea    Past Surgical History:  Procedure Laterality Date  . CARDIAC CATHETERIZATION    . COLONOSCOPY    . COLONOSCOPY N/A 05/11/2015   Procedure: COLONOSCOPY;  Surgeon: Rogene Houston, MD;  [facility];  Service: Endoscopy;  Laterality: N/A;  930 - moved to 2/15 - Ann to notify    Current Outpatient Medications  Medication Sig Dispense Refill  . apixaban (ELIQUIS) 5 MG TABS tablet Take 1 tablet (5 mg total) by mouth 2 (two) times daily. 60 tablet 3  . colchicine 0.6 MG tablet Take 0.6 mg by mouth 2 (two) times daily as needed (gout flare up).     . dronedarone (MULTAQ) 400 MG tablet Take 1 tablet (400 mg total) by mouth 2 (two) times daily with a meal. 60 tablet 3  . febuxostat (ULORIC) 40 MG tablet Take 40 mg by mouth daily.    . Magnesium 200 MG TABS Taking 4 tablets by mouth daily- 800mg  total    . meloxicam (MOBIC) 15 MG tablet Take 15 mg by mouth as needed for pain.     . metoprolol tartrate (LOPRESSOR) 25 MG tablet Take 1 tablet (25 mg total) by mouth 2 (two) times daily. 60 tablet 6  .  pantoprazole (PROTONIX) 40 MG tablet Take 1 tablet (40 mg total) by mouth daily. 30 tablet 0  . Potassium 99 MG TABS Take 99 mg by mouth every evening.    . rosuvastatin (CRESTOR) 5 MG tablet Take 5 mg by mouth once a week.     . tadalafil (CIALIS) 20 MG tablet Take 20 mg by mouth daily as needed for erectile dysfunction.    [name] testosterone cypionate (DEPOTESTOSTERONE CYPIONATE) 200 MG/ML injection Inject 200 mg into the muscle every 14 (fourteen) days.    [name] tetrahydrozoline-zinc (VISINE-AC) 0.05-0.25 % ophthalmic solution  Place 2 drops into both eyes 3 (three) times daily as needed (dry eyes).     No current facility-administered medications for this encounter.    Allergies  Allergen Reactions  . Penicillins Other (See Comments)    Unknown childhood  . Levaquin [Levofloxacin] Palpitations    Social History   Socioeconomic History  . Marital status: Married    Spouse name: Not on file  . Number of children: Not on file  . Years of education: Not on file  . Highest education level: Not on file  Occupational History  . Not on file  Tobacco Use  . Smoking status: Never Smoker  . Smokeless tobacco: Never Used  Vaping Use  . Vaping Use: Never used  Substance and Sexual Activity  . Alcohol use: Yes    Comment: occasionally  . Drug use: No  . Sexual activity: Not on file  Other Topics Concern  . Not on file  Social History Narrative  . Not on file   Social Determinants of Health   Financial Resource Strain:   . Difficulty of Paying Living Expenses: Not on file  Food Insecurity:   . Worried About Charity fundraiser in the Last Year: Not on file  . Ran Out of Food in the Last Year: Not on file  Transportation Needs:   . Lack of Transportation (Medical): Not on file  . Lack of Transportation (Non-Medical): Not on file  Physical Activity:   . Days of Exercise per Week: Not on file  . Minutes of Exercise per Session: Not on file  Stress:   . Feeling of Stress : Not on file  Social Connections:   . Frequency of Communication with Friends and Family: Not on file  . Frequency of Social Gatherings with Friends and Family: Not on file  . Attends Religious Services: Not on file  . Active Member of Clubs or Organizations: Not on file  . Attends Archivist Meetings: Not on file  . Marital Status: Not on file  Intimate Partner Violence:   . Fear of Current or Ex-Partner: Not on file  . Emotionally Abused: Not on file  . Physically Abused: Not on file  . Sexually Abused: Not on file      ROS- All systems are reviewed and negative except as per the HPI above.  Physical Exam: Vitals:   01/15/20 0833  BP: 132/84  Pulse: (!) 53  Weight: (!) 145.6 kg  Height: 5\' 11"  (1.803 m)    GEN- The patient is well appearing obese male, alert and oriented x 3 today.   HEENT-head normocephalic, atraumatic, sclera clear, conjunctiva pink, hearing intact, trachea midline. Lungs- Clear to ausculation bilaterally, normal work of breathing Heart- Regular rate and rhythm, bradycardia, no murmurs, rubs or gallops  GI- soft, NT, ND, + BS Extremities- no clubbing, cyanosis, or edema MS- no significant deformity or atrophy Skin- no rash  or lesion Psych- euthymic mood, full affect Neuro- strength and sensation are intact   Wt Readings from Last 3 Encounters:  01/15/20 (!) 145.6 kg  01/06/20 (!) 144.8 kg  12/26/19 (!) 144.2 kg    EKG today demonstrates SB HR 53, PR 194, QRS 96, QTc 395  Echo 12/26/19 demonstrated  1. Extremely limited due to poor sound wave transmission; definity used;  normal LV systolic and diastolic function; moderate LAE.  2. Left ventricular ejection fraction, by estimation, is 55 to 60%. The  left ventricle has normal function. The left ventricle has no regional  wall motion abnormalities. Left ventricular diastolic parameters were  normal.  3. Right ventricular systolic function is normal. The right ventricular  size is normal.  4. Left atrial size was moderately dilated.  5. The mitral valve is normal in structure. No evidence of mitral valve  regurgitation. No evidence of mitral stenosis.  6. The aortic valve is tricuspid. Aortic valve regurgitation is not  visualized. No aortic stenosis is present.   Epic records are reviewed at length today  CHA2DS2-VASc Score = 2  The patient's score is based upon: CHF History: 0 HTN History: 1 Diabetes History: 1 Stroke History: 0 Vascular Disease History: 0 Age Score: 0 Gender Score: 0       ASSESSMENT AND PLAN: 1. Paroxysmal Atrial Fibrillation (ICD10:  I48.0) The patient's CHA2DS2-VASc score is 2, indicating a 2.2% annual risk of stroke.   Palpitations greatly improved on Multaq.  Continue Eliquis 5 mg BID Continue Lopressor 25 mg BID Continue Multaq 400 mg BID  2. Secondary Hypercoagulable State (ICD10:  D68.69) The patient is at significant risk for stroke/thromboembolism based upon his CHA2DS2-VASc Score of 2.  Continue Apixaban (Eliquis).   3. Obesity Body mass index is 44.77 kg/m. Lifestyle modification was discussed and encouraged including regular physical activity and weight reduction.  4. Obstructive sleep apnea Patient reports compliance with CPAP therapy.  5. HTN Stable, no changes today.   Follow up in the AF clinic in 3 months.    [facility] [address] [place],  [address] [phone] 01/15/2020 8:58 AM

## 2020-01-15 ENCOUNTER — Ambulatory Visit (HOSPITAL_COMMUNITY)
Admission: RE | Admit: 2020-01-15 | Discharge: 2020-01-15 | Disposition: A | Discharge status: Home / Self Care | Payer: BC Managed Care – PPO | Source: Ambulatory Visit | Attending: Physician Assistant | Admitting: Physician Assistant

## 2020-01-15 ENCOUNTER — Other Ambulatory Visit: Payer: Self-pay

## 2020-01-15 ENCOUNTER — Encounter (HOSPITAL_COMMUNITY): Payer: Self-pay | Admitting: Physician Assistant

## 2020-01-15 VITALS — BP 132/84 | HR 53 | Ht 71.0 in | Wt 321.0 lb

## 2020-01-15 DIAGNOSIS — K219 Gastro-esophageal reflux disease without esophagitis: Secondary | ICD-10-CM | POA: Insufficient documentation

## 2020-01-15 DIAGNOSIS — E669 Obesity, unspecified: Secondary | ICD-10-CM | POA: Insufficient documentation

## 2020-01-15 DIAGNOSIS — Z6841 Body Mass Index (BMI) 40.0 and over, adult: Secondary | ICD-10-CM | POA: Insufficient documentation

## 2020-01-15 DIAGNOSIS — I10 Essential (primary) hypertension: Secondary | ICD-10-CM | POA: Insufficient documentation

## 2020-01-15 DIAGNOSIS — G4733 Obstructive sleep apnea (adult) (pediatric): Secondary | ICD-10-CM | POA: Insufficient documentation

## 2020-01-15 DIAGNOSIS — E119 Type 2 diabetes mellitus without complications: Secondary | ICD-10-CM | POA: Insufficient documentation

## 2020-01-15 DIAGNOSIS — Z79899 Other long term (current) drug therapy: Secondary | ICD-10-CM | POA: Insufficient documentation

## 2020-01-15 DIAGNOSIS — I480 Paroxysmal atrial fibrillation: Secondary | ICD-10-CM | POA: Diagnosis not present

## 2020-01-15 DIAGNOSIS — Z7901 Long term (current) use of anticoagulants: Secondary | ICD-10-CM | POA: Insufficient documentation

## 2020-01-15 DIAGNOSIS — D6869 Other thrombophilia: Secondary | ICD-10-CM

## 2020-01-15 DIAGNOSIS — Z79890 Hormone replacement therapy (postmenopausal): Secondary | ICD-10-CM | POA: Insufficient documentation

## 2020-01-15 DIAGNOSIS — Z9989 Dependence on other enabling machines and devices: Secondary | ICD-10-CM | POA: Diagnosis not present

## 2020-01-15 MED ORDER — METOPROLOL TARTRATE 25 MG PO TABS
25.0000 mg | ORAL_TABLET | Freq: Two times a day (BID) | ORAL | 6 refills | Status: DC
Start: 2020-01-15 — End: 2020-04-22

## 2020-01-15 MED ORDER — APIXABAN 5 MG PO TABS
5.0000 mg | ORAL_TABLET | Freq: Two times a day (BID) | ORAL | 3 refills | Status: DC
Start: 2020-01-15 — End: 2020-04-22

## 2020-03-04 ENCOUNTER — Telehealth (HOSPITAL_COMMUNITY): Payer: Self-pay | Admitting: *Deleted

## 2020-03-04 MED ORDER — DILTIAZEM HCL 30 MG PO TABS: ORAL_TABLET | ORAL | 1 refills | Status: DC

## 2020-03-04 NOTE — Telephone Encounter (Signed)
Pt having several episodes of afib over the last week. Episodes lasting from 10-45 minutes. HRs 80-140.  Per Roderic Palau NP can try PRN cardizem 30mg  if episode lasts more than 40 minutes. Pt does say he started intermittent fasting about 1.5 weeks ago and drinking a lot of gatorade. Educated on low sodium diet pt will try cutting back on gatorade intake and see if episodes decrease. Will let our office know if episodes increase in frequency/duration.

## 2020-03-26 DIAGNOSIS — I639 Cerebral infarction, unspecified: Secondary | ICD-10-CM

## 2020-03-26 HISTORY — DX: Cerebral infarction, unspecified: I63.9

## 2020-04-15 ENCOUNTER — Ambulatory Visit (HOSPITAL_COMMUNITY): Payer: PRIVATE HEALTH INSURANCE | Admitting: Physician Assistant

## 2020-04-21 NOTE — Progress Notes (Signed)
Primary Care Physician: Monico Blitz, MD Primary Cardiologist: Dr Stanford Breed Primary Electrophysiologist: none Referring Physician: Dr Clifton James Albert [name] is a 50 y.o. male with a history of DM, HTN, GERD, OSA, and paroxysmal atrial fibrillation who presents for follow up in the [facility]. The patient was initially diagnosed with atrial fibrillation 12/25/19 after presenting to the ED with symptoms of palpitations, SOB, and some chest heaviness. He states he had brief, intermittent palpitations for two weeks prior. Patient was started on Eliquis for a CHADS2VASC score of 2. He converted back to SR spontaneously in the ED. Since then, he has had some mild palpitations almost daily. He did have another extended episode of heart racing on 10/8 which resolved when he took his BB. He is compliant with his CPAP and denies any alcohol use.   On follow up today, patient reports he has done well since his last visit. He did have some breakthrough episodes in December and was started on diltiazem PRN which quickly terminated his afib. He denies any bleeding issues on anticoagulation.   Today, he denies symptoms of palpitations, chest pain, shortness of breath, orthopnea, PND, lower extremity edema, dizziness, presyncope, syncope, bleeding, or neurologic sequela. The patient is tolerating medications without difficulties and is otherwise without complaint today.    Atrial Fibrillation Risk Factors:  he does have symptoms or diagnosis of sleep apnea. he is compliant with CPAP therapy. he does not have a history of rheumatic fever. he does not have a history of alcohol use. The patient does not have a history of early familial atrial fibrillation or other arrhythmias.  he has a BMI of Body mass index is 44.05 kg/m.[name] Filed Weights   04/22/20 0840  Weight: (!) 143.2 kg    Family History  Problem Relation Age of Onset  . Colon cancer Father      Atrial  Fibrillation Management history:  Previous antiarrhythmic drugs: Multaq Previous cardioversions: none Previous ablations: none CHADS2VASC score: 2 Anticoagulation history: Eliquis   Past Medical History:  Diagnosis Date  . Diabetes mellitus without complication ([place])   . GERD (gastroesophageal reflux disease)   . Gout   . Hypertension   . Low testosterone   . Sleep apnea    Past Surgical History:  Procedure Laterality Date  . CARDIAC CATHETERIZATION    . COLONOSCOPY    . COLONOSCOPY N/A 05/11/2015   Procedure: COLONOSCOPY;  Surgeon: Rogene Houston, MD;  [facility];  Service: Endoscopy;  Laterality: N/A;  930 - moved to 2/15 - Ann to notify    Current Outpatient Medications  Medication Sig Dispense Refill  . apixaban (ELIQUIS) 5 MG TABS tablet Take 1 tablet (5 mg total) by mouth 2 (two) times daily. 60 tablet 3  . colchicine 0.6 MG tablet Take 0.6 mg by mouth 2 (two) times daily as needed (gout flare up).     . diltiazem (CARDIZEM) 30 MG tablet Take 1 tablet every 4 hours AS NEEDED for AFIB heart rate >100 30 tablet 1  . dronedarone (MULTAQ) 400 MG tablet Take 1 tablet (400 mg total) by mouth 2 (two) times daily with a meal. 60 tablet 3  . febuxostat (ULORIC) 40 MG tablet Take 40 mg by mouth daily.    . Magnesium 200 MG TABS Taking 4 tablets by mouth daily- 800mg  total    . meloxicam (MOBIC) 15 MG tablet Take 15 mg by mouth as needed for pain.     [name]  metoprolol tartrate (LOPRESSOR) 25 MG tablet Take 1 tablet (25 mg total) by mouth 2 (two) times daily. 60 tablet 6  . pantoprazole (PROTONIX) 40 MG tablet Take 1 tablet (40 mg total) by mouth daily. 30 tablet 0  . Potassium 99 MG TABS Take 99 mg by mouth every evening.    . rosuvastatin (CRESTOR) 5 MG tablet Take 5 mg by mouth once a week.     . tadalafil (CIALIS) 20 MG tablet Take 20 mg by mouth daily as needed for erectile dysfunction.    [name] testosterone cypionate (DEPOTESTOSTERONE CYPIONATE) 200 MG/ML injection Inject  200 mg into the muscle every 14 (fourteen) days.    [name] tetrahydrozoline-zinc (VISINE-AC) 0.05-0.25 % ophthalmic solution Place 2 drops into both eyes 3 (three) times daily as needed (dry eyes).     No current facility-administered medications for this encounter.    Allergies  Allergen Reactions  . Penicillins Other (See Comments)    Unknown childhood  . Levaquin [Levofloxacin] Palpitations    Social History   Socioeconomic History  . Marital status: Married    Spouse name: Not on file  . Number of children: Not on file  . Years of education: Not on file  . Highest education level: Not on file  Occupational History  . Not on file  Tobacco Use  . Smoking status: Never Smoker  . Smokeless tobacco: Never Used  Vaping Use  . Vaping Use: Never used  Substance and Sexual Activity  . Alcohol use: Yes    Comment: occasionally  . Drug use: No  . Sexual activity: Not on file  Other Topics Concern  . Not on file  Social History Narrative  . Not on file   Social Determinants of Health   Financial Resource Strain: Not on file  Food Insecurity: Not on file  Transportation Needs: Not on file  Physical Activity: Not on file  Stress: Not on file  Social Connections: Not on file  Intimate Partner Violence: Not on file     ROS- All systems are reviewed and negative except as per the HPI above.  Physical Exam: Vitals:   04/22/20 0840  BP: 116/80  Pulse: (!) 57  Weight: (!) 143.2 kg  Height: 5\' 11"  (1.803 m)    GEN- The patient is well appearing obese male, alert and oriented x 3 today.   HEENT-head normocephalic, atraumatic, sclera clear, conjunctiva pink, hearing intact, trachea midline. Lungs- Clear to ausculation bilaterally, normal work of breathing Heart- Regular rate and rhythm, no murmurs, rubs or gallops  GI- soft, NT, ND, + BS Extremities- no clubbing, cyanosis, or edema MS- no significant deformity or atrophy Skin- no rash or lesion Psych- euthymic mood, full  affect Neuro- strength and sensation are intact   Wt Readings from Last 3 Encounters:  04/22/20 (!) 143.2 kg  01/15/20 (!) 145.6 kg  01/06/20 (!) 144.8 kg    EKG today demonstrates  SB, 1st degree AV block Vent. rate 57 BPM PR interval 202 ms QRS duration 102 ms QT/QTc 414/402 ms  Echo 12/26/19 demonstrated  1. Extremely limited due to poor sound wave transmission; definity used;  normal LV systolic and diastolic function; moderate LAE.  2. Left ventricular ejection fraction, by estimation, is 55 to 60%. The  left ventricle has normal function. The left ventricle has no regional  wall motion abnormalities. Left ventricular diastolic parameters were  normal.  3. Right ventricular systolic function is normal. The right ventricular  size is normal.  4.  Left atrial size was moderately dilated.  5. The mitral valve is normal in structure. No evidence of mitral valve  regurgitation. No evidence of mitral stenosis.  6. The aortic valve is tricuspid. Aortic valve regurgitation is not  visualized. No aortic stenosis is present.   Epic records are reviewed at length today  CHA2DS2-VASc Score = 2  The patient's score is based upon: CHF History: No HTN History: Yes Diabetes History: Yes Stroke History: No Vascular Disease History: No Age Score: 0 Gender Score: 0      ASSESSMENT AND PLAN: 1. Paroxysmal Atrial Fibrillation (ICD10:  I48.0) The patient's CHA2DS2-VASc score is 2, indicating a 2.2% annual risk of stroke.   Patient appears to be maintaining SR. If he fails Multaq, can consider flecainide vs ablation.  Continue Eliquis 5 mg BID Continue Lopressor 25 mg BID Continue Multaq 400 mg BID Continue diltiazem 30 mg PRN q 4 hours for heat racing.   2. Secondary Hypercoagulable State (ICD10:  D68.69) The patient is at significant risk for stroke/thromboembolism based upon his CHA2DS2-VASc Score of 2.  Continue Apixaban (Eliquis).   3. Obesity Body mass index is  44.05 kg/m. Lifestyle modification was discussed and encouraged including regular physical activity and weight reduction.  4. Obstructive sleep apnea Patient reports compliance with CPAP therapy.  5. HTN Stable, no changes today.   Follow up in the AF clinic in 6 months.    [facility] [address] [place], [place] [address] [phone] 04/22/2020 8:44 AM

## 2020-04-22 ENCOUNTER — Ambulatory Visit (HOSPITAL_COMMUNITY)
Admission: RE | Admit: 2020-04-22 | Discharge: 2020-04-22 | Disposition: A | Discharge status: Home / Self Care | Payer: BC Managed Care – PPO | Source: Ambulatory Visit | Attending: Physician Assistant | Admitting: Physician Assistant

## 2020-04-22 ENCOUNTER — Encounter (HOSPITAL_COMMUNITY): Payer: Self-pay | Admitting: Physician Assistant

## 2020-04-22 ENCOUNTER — Other Ambulatory Visit: Payer: Self-pay

## 2020-04-22 VITALS — BP 116/80 | HR 57 | Ht 71.0 in | Wt 315.8 lb

## 2020-04-22 DIAGNOSIS — Z79899 Other long term (current) drug therapy: Secondary | ICD-10-CM | POA: Insufficient documentation

## 2020-04-22 DIAGNOSIS — Z7901 Long term (current) use of anticoagulants: Secondary | ICD-10-CM | POA: Insufficient documentation

## 2020-04-22 DIAGNOSIS — I480 Paroxysmal atrial fibrillation: Secondary | ICD-10-CM | POA: Diagnosis not present

## 2020-04-22 DIAGNOSIS — D6869 Other thrombophilia: Secondary | ICD-10-CM | POA: Diagnosis not present

## 2020-04-22 DIAGNOSIS — I10 Essential (primary) hypertension: Secondary | ICD-10-CM | POA: Diagnosis not present

## 2020-04-22 DIAGNOSIS — E119 Type 2 diabetes mellitus without complications: Secondary | ICD-10-CM | POA: Insufficient documentation

## 2020-04-22 DIAGNOSIS — Z6841 Body Mass Index (BMI) 40.0 and over, adult: Secondary | ICD-10-CM | POA: Diagnosis not present

## 2020-04-22 DIAGNOSIS — K219 Gastro-esophageal reflux disease without esophagitis: Secondary | ICD-10-CM | POA: Diagnosis not present

## 2020-04-22 DIAGNOSIS — G4733 Obstructive sleep apnea (adult) (pediatric): Secondary | ICD-10-CM | POA: Diagnosis not present

## 2020-04-22 DIAGNOSIS — E669 Obesity, unspecified: Secondary | ICD-10-CM | POA: Insufficient documentation

## 2020-04-22 MED ORDER — METOPROLOL TARTRATE 25 MG PO TABS
25.0000 mg | ORAL_TABLET | Freq: Two times a day (BID) | ORAL | 2 refills | Status: DC
Start: 2020-04-22 — End: 2020-11-30

## 2020-04-22 MED ORDER — APIXABAN 5 MG PO TABS
5.0000 mg | ORAL_TABLET | Freq: Two times a day (BID) | ORAL | 2 refills | Status: DC
Start: 2020-04-22 — End: 2020-05-18

## 2020-04-22 MED ORDER — MULTAQ 400 MG PO TABS: 400.0000 mg | ORAL_TABLET | Freq: Two times a day (BID) | ORAL | 2 refills | Status: DC

## 2020-04-27 ENCOUNTER — Other Ambulatory Visit (HOSPITAL_COMMUNITY): Payer: Self-pay | Admitting: Physician Assistant

## 2020-05-02 ENCOUNTER — Other Ambulatory Visit (HOSPITAL_COMMUNITY): Payer: Self-pay | Admitting: Physician Assistant

## 2020-05-06 ENCOUNTER — Other Ambulatory Visit (HOSPITAL_COMMUNITY): Payer: Self-pay | Admitting: Physician Assistant

## 2020-05-18 ENCOUNTER — Other Ambulatory Visit (HOSPITAL_COMMUNITY): Payer: Self-pay | Admitting: Physician Assistant

## 2020-05-19 ENCOUNTER — Encounter (INDEPENDENT_AMBULATORY_CARE_PROVIDER_SITE_OTHER): Payer: Self-pay | Admitting: *Deleted

## 2020-05-29 ENCOUNTER — Other Ambulatory Visit (HOSPITAL_COMMUNITY): Payer: Self-pay | Admitting: Physician Assistant

## 2020-06-22 ENCOUNTER — Other Ambulatory Visit (INDEPENDENT_AMBULATORY_CARE_PROVIDER_SITE_OTHER): Payer: Self-pay | Admitting: *Deleted

## 2020-06-23 ENCOUNTER — Telehealth: Payer: Self-pay | Admitting: *Deleted

## 2020-06-23 ENCOUNTER — Encounter (INDEPENDENT_AMBULATORY_CARE_PROVIDER_SITE_OTHER): Payer: Self-pay | Admitting: *Deleted

## 2020-06-23 NOTE — Telephone Encounter (Signed)
   [facility] Pre-operative Risk Assessment    HEARTCARE STAFF: - Please ensure there is not already an duplicate clearance open for this procedure. - Under Visit Info/Reason for Call, type in Other and utilize the format Clearance MM/DD/YY or Clearance TBD. Do not use dashes or single digits. - If request is for dental extraction, please clarify the # of teeth to be extracted.  Request for surgical clearance:  1. What type of surgery is being performed? COLONOSCOPY   2. When is this surgery scheduled? 07/27/20   3. What type of clearance is required (medical clearance vs. Pharmacy clearance to hold med vs. Both)? PHARM PER CLEARANCE REQ  4. Are there any medications that need to be held prior to surgery and how long? ELIQUIS x 2 DAYS PRIOR   5. Practice name and name of physician performing surgery?  GI; DR. Bernadene Person REHMAN   6. What is the office phone number? [phone]   7.   What is the office fax number? [phone]  8.   Anesthesia type (None, local, MAC, general) ? MAC   Julaine Hua 06/23/2020, 11:25 AM  _________________________________________________________________   (provider comments below)

## 2020-06-24 ENCOUNTER — Other Ambulatory Visit (INDEPENDENT_AMBULATORY_CARE_PROVIDER_SITE_OTHER): Payer: Self-pay

## 2020-06-24 DIAGNOSIS — Z800 Family history of malignant neoplasm of digestive organs: Secondary | ICD-10-CM

## 2020-06-24 DIAGNOSIS — Z86010 Personal history of colonic polyps: Secondary | ICD-10-CM

## 2020-06-24 NOTE — Telephone Encounter (Signed)
Patient with diagnosis of A Fib on Eliquis for anticoagulation.    Procedure: colonoscopy Date of procedure: 07/27/20   CHA2DS2-VASc Score = 2  This indicates a 2.2% annual risk of stroke. The patient's score is based upon: CHF History: No HTN History: Yes Diabetes History: Yes Stroke History: No Vascular Disease History: No Age Score: 0 Gender Score: 0   CrCl 157mL/min Platelet count 225K   Per office protocol, patient can hold Eliquis for 2 days prior to procedure.

## 2020-06-24 NOTE — Telephone Encounter (Signed)
   Primary Cardiologist: Kirk Ruths, MD  Chart reviewed as part of pre-operative protocol coverage. Given past medical history and time since last visit, based on ACC/AHA guidelines, Damon Hargrove would be at acceptable risk for the planned procedure without further cardiovascular testing.   Patient with diagnosis of A Fib on Eliquis for anticoagulation.    Procedure: colonoscopy Date of procedure: 07/27/20   CHA2DS2-VASc Score = 2  This indicates a 2.2% annual risk of stroke. The patient's score is based upon: CHF History: No HTN History: Yes Diabetes History: Yes Stroke History: No Vascular Disease History: No Age Score: 0 Gender Score: 0   CrCl 159mL/min Platelet count 225K   Per office protocol, patient can hold Eliquis for 2 days prior to procedure.  I will route this recommendation to the requesting party via Epic fax function and remove from pre-op pool.  Please call with questions.  Jossie Ng. Caileigh Canche NP-C    06/24/2020, 12:12 PM [place] [facility] [address] Office [phone] Fax [phone]

## 2020-06-27 ENCOUNTER — Telehealth (INDEPENDENT_AMBULATORY_CARE_PROVIDER_SITE_OTHER): Payer: Self-pay | Admitting: *Deleted

## 2020-06-27 NOTE — Telephone Encounter (Signed)
Per [facility], patient can stop Eliquis 2 days prior to TCS sch'd 07/27/20, patient aware

## 2020-06-28 NOTE — Telephone Encounter (Signed)
Cardiology recommendations noted. 

## 2020-07-01 ENCOUNTER — Telehealth (INDEPENDENT_AMBULATORY_CARE_PROVIDER_SITE_OTHER): Payer: Self-pay | Admitting: *Deleted

## 2020-07-01 NOTE — Telephone Encounter (Signed)
Referring MD/PCP: shah   Procedure: tcs w propofol  Reason/Indication:  Hx polyps, fam hx colon ca  Has patient had this procedure before?  Yes, 2017  If so, when, by whom and where?    Is there a family history of colon cancer?  Yes, father  Who?  What age when diagnosed?    Is patient diabetic?   no      Does patient have prosthetic heart valve or mechanical valve?  no  Do you have a pacemaker/defibrillator?  no  Has patient ever had endocarditis/atrial fibrillation? Yes, atrial fib  Have you had a stroke/heart attack last 6 mths? no  Does patient use oxygen? no  Has patient had joint replacement within last 12 months?  no  Is patient constipated or do they take laxatives? no  Does patient have a history of alcohol/drug use?  no  Is patient on blood thinner such as Coumadin, Plavix and/or Aspirin? yes  Do you take medicine for weight loss. no  Medications: eliquis 5 mg bid, metoprolol 25 mg bid, esomeprazole 40 mg daily, febuxostat 400 mg daily, testosterone, cetirizine 10 mg daily, magnesium 800 mg daily, tadalafil 20 mg prn, diltiazem 30 mg daily  Allergies: pcn, levaquin  Medication Adjustment per Dr Rehman/Dr Jenetta Downer eliquis 2 days  Procedure date & time: 07/27/20

## 2020-07-13 ENCOUNTER — Encounter (HOSPITAL_COMMUNITY): Payer: Self-pay

## 2020-07-21 NOTE — Patient Instructions (Signed)
Albert [name]  07/21/2020     @PREFPERIOPPHARMACY @   Your procedure is scheduled on  07/27/2020.   Report to [facility] at  0700  A.M.   Call this number if you have problems the morning of surgery:  [phone]   Remember:  Follow the diet and prep instructions given to you by the office.                      Take these medicines the morning of surgery with A SIP OF WATER  Diliazem, uloric, mobic (if needed), metoprolol, protonix.     Please brush your teeth.  Do not wear jewelry, make-up or nail polish.  Do not wear lotions, powders, or perfumes, or deodorant.  Do not shave 48 hours prior to surgery.  Men may shave face and neck.  Do not bring valuables to the hospital.  [facility] is not responsible for any belongings or valuables.  Contacts, dentures or bridgework may not be worn into surgery.  Leave your suitcase in the car.  After surgery it may be brought to your room.  For patients admitted to the hospital, discharge time will be determined by your treatment team.  Patients discharged the day of surgery will not be allowed to drive home and must have someone with them for 24 hours.   Special instructions:  DO NOT smoke tobacco or vape for 24 hours before your procedure.  Please read over the following fact sheets that you were given. Anesthesia Post-op Instructions and Care and Recovery After Surgery       Colonoscopy, Adult, Care After This sheet gives you information about how to care for yourself after your procedure. Your health care provider may also give you more specific instructions. If you have problems or questions, contact your health care provider. What can I expect after the procedure? After the procedure, it is common to have:  A small amount of blood in your stool for 24 hours after the procedure.  Some gas.  Mild cramping or bloating of your abdomen. Follow these instructions at home: Eating and drinking  Drink enough  fluid to keep your urine pale yellow.  Follow instructions from your health care provider about eating or drinking restrictions.  Resume your normal diet as instructed by your health care provider. Avoid heavy or fried foods that are hard to digest.   Activity  Rest as told by your health care provider.  Avoid sitting for a long time without moving. Get up to take short walks every 1-2 hours. This is important to improve blood flow and breathing. Ask for help if you feel weak or unsteady.  Return to your normal activities as told by your health care provider. Ask your health care provider what activities are safe for you. Managing cramping and bloating  Try walking around when you have cramps or feel bloated.  Apply heat to your abdomen as told by your health care provider. Use the heat source that your health care provider recommends, such as a moist heat pack or a heating pad. ? Place a towel between your skin and the heat source. ? Leave the heat on for 20-30 minutes. ? Remove the heat if your skin turns bright red. This is especially important if you are unable to feel pain, heat, or cold. You may have a greater risk of getting burned.   General instructions  If you were given a sedative during  the procedure, it can affect you for several hours. Do not drive or operate machinery until your health care provider says that it is safe.  For the first 24 hours after the procedure: ? Do not sign important documents. ? Do not drink alcohol. ? Do your regular daily activities at a slower pace than normal. ? Eat soft foods that are easy to digest.  Take over-the-counter and prescription medicines only as told by your health care provider.  Keep all follow-up visits as told by your health care provider. This is important. Contact a health care provider if:  You have blood in your stool 2-3 days after the procedure. Get help right away if you have:  More than a small spotting of blood  in your stool.  Large blood clots in your stool.  Swelling of your abdomen.  Nausea or vomiting.  A fever.  Increasing pain in your abdomen that is not relieved with medicine. Summary  After the procedure, it is common to have a small amount of blood in your stool. You may also have mild cramping and bloating of your abdomen.  If you were given a sedative during the procedure, it can affect you for several hours. Do not drive or operate machinery until your health care provider says that it is safe.  Get help right away if you have a lot of blood in your stool, nausea or vomiting, a fever, or increased pain in your abdomen. This information is not intended to replace advice given to you by your health care provider. Make sure you discuss any questions you have with your health care provider. Document Revised: 03/06/2019 Document Reviewed: 10/06/2018 Elsevier Patient Education  2021 [place] After This sheet gives you information about how to care for yourself after your procedure. Your health care provider may also give you more specific instructions. If you have problems or questions, contact your health care provider. What can I expect after the procedure? After the procedure, it is common to have:  Tiredness.  Forgetfulness about what happened after the procedure.  Impaired judgment for important decisions.  Nausea or vomiting.  Some difficulty with balance. Follow these instructions at home: For the time period you were told by your health care provider:  Rest as needed.  Do not participate in activities where you could fall or become injured.  Do not drive or use machinery.  Do not drink alcohol.  Do not take sleeping pills or medicines that cause drowsiness.  Do not make important decisions or sign legal documents.  Do not take care of children on your own.      Eating and drinking  Follow the diet that is recommended  by your health care provider.  Drink enough fluid to keep your urine pale yellow.  If you vomit: ? Drink water, juice, or soup when you can drink without vomiting. ? Make sure you have little or no nausea before eating solid foods. General instructions  Have a responsible adult stay with you for the time you are told. It is important to have someone help care for you until you are awake and alert.  Take over-the-counter and prescription medicines only as told by your health care provider.  If you have sleep apnea, surgery and certain medicines can increase your risk for breathing problems. Follow instructions from your health care provider about wearing your sleep device: ? Anytime you are sleeping, including during daytime naps. ? While taking prescription pain  medicines, sleeping medicines, or medicines that make you drowsy.  Avoid smoking.  Keep all follow-up visits as told by your health care provider. This is important. Contact a health care provider if:  You keep feeling nauseous or you keep vomiting.  You feel light-headed.  You are still sleepy or having trouble with balance after 24 hours.  You develop a rash.  You have a fever.  You have redness or swelling around the IV site. Get help right away if:  You have trouble breathing.  You have new-onset confusion at home. Summary  For several hours after your procedure, you may feel tired. You may also be forgetful and have poor judgment.  Have a responsible adult stay with you for the time you are told. It is important to have someone help care for you until you are awake and alert.  Rest as told. Do not drive or operate machinery. Do not drink alcohol or take sleeping pills.  Get help right away if you have trouble breathing, or if you suddenly become confused. This information is not intended to replace advice given to you by your health care provider. Make sure you discuss any questions you have with your  health care provider. Document Revised: 11/26/2019 Document Reviewed: 02/12/2019 Elsevier Patient Education  2021 [employer].

## 2020-07-25 ENCOUNTER — Encounter (HOSPITAL_COMMUNITY)
Admission: RE | Admit: 2020-07-25 | Discharge: 2020-07-25 | Disposition: A | Discharge status: Home / Self Care | Payer: BC Managed Care – PPO | Source: Ambulatory Visit | Attending: Internal Medicine | Admitting: Internal Medicine

## 2020-07-25 ENCOUNTER — Other Ambulatory Visit (HOSPITAL_COMMUNITY)
Admission: RE | Admit: 2020-07-25 | Discharge: 2020-07-25 | Disposition: A | Discharge status: Home / Self Care | Payer: BC Managed Care – PPO | Source: Ambulatory Visit | Attending: Internal Medicine | Admitting: Internal Medicine

## 2020-07-25 ENCOUNTER — Other Ambulatory Visit: Payer: Self-pay

## 2020-07-25 DIAGNOSIS — Z01812 Encounter for preprocedural laboratory examination: Secondary | ICD-10-CM | POA: Insufficient documentation

## 2020-07-25 LAB — BASIC METABOLIC PANEL
Anion gap: 7 (ref 5–15)
BUN: 37 mg/dL — ABNORMAL HIGH (ref 6–20)
CO2: 26 mmol/L (ref 22–32)
Calcium: 9.3 mg/dL (ref 8.9–10.3)
Chloride: 104 mmol/L (ref 98–111)
Creatinine, Ser: 2.06 mg/dL — ABNORMAL HIGH (ref 0.61–1.24)
GFR, Estimated: 39 mL/min — ABNORMAL LOW (ref >60)
Glucose, Bld: 103 mg/dL — ABNORMAL HIGH (ref 70–99)
Potassium: 4.7 mmol/L (ref 3.5–5.1)
Sodium: 137 mmol/L (ref 135–145)

## 2020-07-25 NOTE — Progress Notes (Signed)
Patient did not see instructions to hold Eliquis for two days.  Dr Laural Golden and Lacretia Nicks notified and office will call to reschedule procedure

## 2020-07-26 ENCOUNTER — Encounter (INDEPENDENT_AMBULATORY_CARE_PROVIDER_SITE_OTHER): Payer: Self-pay | Admitting: *Deleted

## 2020-08-24 DEATH — deceased

## 2020-09-02 ENCOUNTER — Encounter (HOSPITAL_COMMUNITY)
Admission: RE | Admit: 2020-09-02 | Discharge: 2020-09-02 | Disposition: A | Discharge status: Home / Self Care | Payer: BC Managed Care – PPO | Source: Ambulatory Visit | Attending: Internal Medicine | Admitting: Internal Medicine

## 2020-09-02 ENCOUNTER — Other Ambulatory Visit: Payer: Self-pay

## 2020-09-02 ENCOUNTER — Encounter (INDEPENDENT_AMBULATORY_CARE_PROVIDER_SITE_OTHER): Payer: Self-pay

## 2020-09-02 ENCOUNTER — Encounter (HOSPITAL_COMMUNITY): Payer: Self-pay

## 2020-09-05 ENCOUNTER — Other Ambulatory Visit (HOSPITAL_COMMUNITY): Payer: BC Managed Care – PPO

## 2020-09-07 ENCOUNTER — Ambulatory Visit (HOSPITAL_COMMUNITY): Payer: BC Managed Care – PPO | Admitting: Certified Registered"

## 2020-09-07 ENCOUNTER — Encounter (HOSPITAL_COMMUNITY)
Admission: RE | Disposition: A | Discharge status: Home / Self Care | Payer: Self-pay | Source: Ambulatory Visit | Attending: Internal Medicine

## 2020-09-07 ENCOUNTER — Ambulatory Visit (HOSPITAL_COMMUNITY)
Admission: RE | Admit: 2020-09-07 | Discharge: 2020-09-07 | Disposition: A | Discharge status: Home / Self Care | Payer: BC Managed Care – PPO | Source: Ambulatory Visit | Attending: Internal Medicine | Admitting: Internal Medicine

## 2020-09-07 ENCOUNTER — Encounter (HOSPITAL_COMMUNITY): Payer: Self-pay | Admitting: Internal Medicine

## 2020-09-07 DIAGNOSIS — Z880 Allergy status to penicillin: Secondary | ICD-10-CM | POA: Diagnosis not present

## 2020-09-07 DIAGNOSIS — D123 Benign neoplasm of transverse colon: Secondary | ICD-10-CM | POA: Insufficient documentation

## 2020-09-07 DIAGNOSIS — Z79899 Other long term (current) drug therapy: Secondary | ICD-10-CM | POA: Diagnosis not present

## 2020-09-07 DIAGNOSIS — Z86010 Personal history of colonic polyps: Secondary | ICD-10-CM

## 2020-09-07 DIAGNOSIS — Z800 Family history of malignant neoplasm of digestive organs: Secondary | ICD-10-CM | POA: Diagnosis not present

## 2020-09-07 DIAGNOSIS — K644 Residual hemorrhoidal skin tags: Secondary | ICD-10-CM | POA: Diagnosis not present

## 2020-09-07 DIAGNOSIS — Z881 Allergy status to other antibiotic agents status: Secondary | ICD-10-CM | POA: Diagnosis not present

## 2020-09-07 DIAGNOSIS — Z1211 Encounter for screening for malignant neoplasm of colon: Secondary | ICD-10-CM | POA: Insufficient documentation

## 2020-09-07 DIAGNOSIS — D122 Benign neoplasm of ascending colon: Secondary | ICD-10-CM | POA: Insufficient documentation

## 2020-09-07 DIAGNOSIS — I4891 Unspecified atrial fibrillation: Secondary | ICD-10-CM | POA: Insufficient documentation

## 2020-09-07 DIAGNOSIS — Z8371 Family history of colonic polyps: Secondary | ICD-10-CM | POA: Diagnosis not present

## 2020-09-07 DIAGNOSIS — K648 Other hemorrhoids: Secondary | ICD-10-CM | POA: Diagnosis not present

## 2020-09-07 DIAGNOSIS — D120 Benign neoplasm of cecum: Secondary | ICD-10-CM | POA: Insufficient documentation

## 2020-09-07 DIAGNOSIS — Z7901 Long term (current) use of anticoagulants: Secondary | ICD-10-CM | POA: Insufficient documentation

## 2020-09-07 DIAGNOSIS — D125 Benign neoplasm of sigmoid colon: Secondary | ICD-10-CM | POA: Diagnosis not present

## 2020-09-07 HISTORY — PX: COLONOSCOPY WITH PROPOFOL: SHX5780

## 2020-09-07 HISTORY — PX: POLYPECTOMY: SHX5525

## 2020-09-07 LAB — GLUCOSE, CAPILLARY: Glucose-Capillary: 113 mg/dL — ABNORMAL HIGH (ref 70–99)

## 2020-09-07 LAB — HM COLONOSCOPY

## 2020-09-07 SURGERY — COLONOSCOPY WITH PROPOFOL
Anesthesia: General

## 2020-09-07 MED ORDER — PROPOFOL 500 MG/50ML IV EMUL
INTRAVENOUS | Status: DC | PRN
  Administered 2020-09-07: 150 ug/kg/min via INTRAVENOUS

## 2020-09-07 MED ORDER — LACTATED RINGERS IV SOLN
INTRAVENOUS | Status: DC
  Administered 2020-09-07: 1000 mL via INTRAVENOUS

## 2020-09-07 MED ORDER — PROPOFOL 10 MG/ML IV BOLUS
INTRAVENOUS | Status: DC | PRN
  Administered 2020-09-07: 100 mg via INTRAVENOUS
  Administered 2020-09-07: 40 mg via INTRAVENOUS

## 2020-09-07 MED ORDER — CHLORHEXIDINE GLUCONATE CLOTH 2 % EX PADS: 6.0000 | MEDICATED_PAD | Freq: Once | CUTANEOUS | Status: DC

## 2020-09-07 MED ORDER — LIDOCAINE HCL (CARDIAC) PF 100 MG/5ML IV SOSY
PREFILLED_SYRINGE | INTRAVENOUS | Status: DC | PRN
  Administered 2020-09-07: 50 mg via INTRAVENOUS

## 2020-09-07 NOTE — Op Note (Signed)
[facility] Patient Name: Albert [name] Procedure Date: 09/07/2020 9:30 AM MRN: [id number] Date of Birth: June 03, 1970 Attending MD: Hildred Laser , MD CSN: [id number] Age: 50 Admit Type: Outpatient Procedure:                Colonoscopy Indications:              High risk colon cancer surveillance: Personal                            history of colonic polyps Providers:                Hildred Laser, MD, Gwenlyn Fudge, RN, Randa Spike, Technician Referring MD:             Monico Blitz, MD Medicines:                Propofol per Anesthesia Complications:            No immediate complications. Estimated Blood Loss:     Estimated blood loss was minimal. Procedure:                Pre-Anesthesia Assessment:                           - Prior to the procedure, a History and Physical                            was performed, and patient medications and                            allergies were reviewed. The patient's tolerance of                            previous anesthesia was also reviewed. The risks                            and benefits of the procedure and the sedation                            options and risks were discussed with the patient.                            All questions were answered, and informed consent                            was obtained. Prior Anticoagulants: The patient                            last took Eliquis (apixaban) 3 days prior to the                            procedure. ASA Grade Assessment: III - A patient  with severe systemic disease. After reviewing the                            risks and benefits, the patient was deemed in                            satisfactory condition to undergo the procedure.                           After obtaining informed consent, the colonoscope                            was passed under direct vision. Throughout the                            procedure, the  patient's blood pressure, pulse, and                            oxygen saturations were monitored continuously. The                            PCF-HQ190L ([id number]) scope was introduced through                            the anus and advanced to the the cecum, identified                            by appendiceal orifice and ileocecal valve. The                            colonoscopy was performed without difficulty. The                            patient tolerated the procedure well. The quality                            of the bowel preparation was adequate. The                            ileocecal valve, appendiceal orifice, and rectum                            were photographed. Scope In: 10:02:31 AM Scope Out: 10:40:55 AM Scope Withdrawal Time: 0 hours 14 minutes 34 seconds  Total Procedure Duration: 0 hours 38 minutes 24 seconds  Findings:      The perianal and digital rectal examinations were normal.      A 6 mm polyp was found in the cecum. The polyp was removed with a cold       snare. Resection and retrieval were complete. The pathology specimen was       placed into Bottle Number 3.      Six polyps were found in the sigmoid colon, transverse colon, hepatic       flexure and ascending colon. The polyps were small in size. These polyps  were removed with a cold snare. Resection and retrieval were complete.       The pathology specimen was placed into Bottle Number 2. The pathology       specimen was placed into Bottle Number 2.      A small polyp was found in the hepatic flexure. Biopsies were taken with       a cold forceps for histology. The pathology specimen was placed into       Bottle Number 2.      A 8 mm polyp was found in the splenic flexure. The polyp was flat. The       polyp was removed with a cold snare. Resection and retrieval were       complete. The pathology specimen was placed into Bottle Number 1.      External and internal hemorrhoids were found during  retroflexion. The       hemorrhoids were small. Impression:               - One 6 mm polyp in the cecum, removed with a cold                            snare. Resected and retrieved.                           - Six small polyps in the sigmoid colon, in the                            transverse colon, at the hepatic flexure and in the                            ascending colon, removed with a cold snare.                            Resected and retrieved.                           - One small polyp at the hepatic flexure. Biopsied.                           - One 8 mm polyp at the splenic flexure, removed                            with a cold snare. Resected and retrieved. Location                            confirmed the scope guide.                           - External and internal hemorrhoids. Moderate Sedation:      Per Anesthesia Care Recommendation:           - Patient has a contact number available for                            emergencies. The signs and symptoms of potential  delayed complications were discussed with the                            patient. Return to normal activities tomorrow.                            Written discharge instructions were provided to the                            patient.                           - Resume previous diet today.                           - Continue present medications.                           - Resume Eliquis (apixaban) at prior dose tomorrow.                           - Await pathology results.                           - Repeat colonoscopy is recommended. The                            colonoscopy date will be determined after pathology                            results from today's exam become available for                            review. Procedure Code(s):        --- Professional ---                           [phone], Colonoscopy, flexible; with removal of                            tumor(s),  polyp(s), or other lesion(s) by snare                            technique                           45380, 26, Colonoscopy, flexible; with biopsy,                            single or multiple Diagnosis Code(s):        --- Professional ---                           K63.5, Polyp of colon                           Z86.010, Personal history of colonic polyps  K64.8, Other hemorrhoids CPT copyright 2019 American Medical Association. All rights reserved. The codes documented in this report are preliminary and upon coder review may  be revised to meet current compliance requirements. Hildred Laser, MD Hildred Laser, MD 09/07/2020 10:55:28 AM This report has been signed electronically. Number of Addenda: 0

## 2020-09-07 NOTE — H&P (Signed)
Albert [name] is an 50 y.o. male.   Chief Complaint: Patient is here for colonoscopy HPI: Patient is 50 year old Caucasian male who has a history of colonic adenomas and is here for surveillance colonoscopy.  His last colonoscopy was in February 2017 that coagulation of small polyp and removal of another tubular adenoma.  He denies abdominal pain or change in bowel habits.  He states he was diagnosed with A. fib in October last year and has been maintained on Eliquis.  Last dose was 3 days ago. Family history significant for colon carcinoma in his father who was in his early or mid 72s and is doing fine at age 60.  His paternal grandfather had multiple colonic adenomas.   Past Medical History:  Diagnosis Date   Diabetes mellitus without complication (HCC)    GERD (gastroesophageal reflux disease)    Gout    Hypertension    Low testosterone    Sleep apnea     Past Surgical History:  Procedure Laterality Date   CARDIAC CATHETERIZATION     COLONOSCOPY     COLONOSCOPY N/A 05/11/2015   Procedure: COLONOSCOPY;  Surgeon: Rogene Houston, MD;  Location: [facility];  Service: Endoscopy;  Laterality: N/A;  930 - moved to 2/15 - Ann to notify    Family History  Problem Relation Age of Onset   Colon cancer Father    Social History:  reports that he has never smoked. He has never used smokeless tobacco. He reports current alcohol use. He reports that he does not use drugs.  Allergies:  Allergies  Allergen Reactions   Penicillins Other (See Comments)    Unknown childhood   Levaquin [Levofloxacin] Palpitations    Medications Prior to Admission  Medication Sig Dispense Refill   acetaminophen (TYLENOL) 650 MG CR tablet Take 650 mg by mouth every 8 (eight) hours as needed for pain.     Cholecalciferol (DIALYVITE VITAMIN D 5000) 125 MCG (5000 UT) capsule Take 5,000 Units by mouth daily.     clobetasol cream (TEMOVATE) 0.35 % Apply 1 application topically 2 (two) times daily as needed  (itching).     colchicine 0.6 MG tablet Take 0.6 mg by mouth 2 (two) times daily as needed (gout flare up).      diltiazem (CARDIZEM) 30 MG tablet Take 1 tablet every 4 hours AS NEEDED for AFIB heart rate >100 (Patient taking differently: Take 30 mg by mouth every 4 (four) hours as needed (AFIB heart rate >100).) 30 tablet 1   diphenhydrAMINE (BENADRYL) 25 MG tablet Take 25 mg by mouth at bedtime.     ELIQUIS 5 MG TABS tablet TAKE (1) TABLET TWICE DAILY. (Patient taking differently: Take 5 mg by mouth 2 (two) times daily.) 60 tablet 11   febuxostat (ULORIC) 40 MG tablet Take 40 mg by mouth daily.     fluticasone (FLONASE) 50 MCG/ACT nasal spray Place 1 spray into both nostrils daily as needed for allergies or rhinitis.     Magnesium 200 MG TABS Take 800 mg by mouth daily. Taking 4 tablets by mouth daily- 800mg  total     Melatonin 10 MG CAPS Take 10 mg by mouth at bedtime.     meloxicam (MOBIC) 15 MG tablet Take 15 mg by mouth as needed for pain.      metoprolol tartrate (LOPRESSOR) 25 MG tablet Take 1 tablet (25 mg total) by mouth 2 (two) times daily. 180 tablet 2   MULTAQ 400 MG tablet TAKE 1 TABLET  BY MOUTH 2 TIMES A DAY WITH MEALS. (Patient taking differently: Take 400 mg by mouth 2 (two) times daily.) 60 tablet 6   pantoprazole (PROTONIX) 40 MG tablet Take 1 tablet (40 mg total) by mouth daily. 30 tablet 0   Potassium 99 MG TABS Take 99 mg by mouth every evening.     tadalafil (CIALIS) 20 MG tablet Take 20 mg by mouth daily as needed for erectile dysfunction.     testosterone cypionate (DEPOTESTOSTERONE CYPIONATE) 200 MG/ML injection Inject 200 mg into the muscle every 14 (fourteen) days.     tetrahydrozoline-zinc (VISINE-AC) 0.05-0.25 % ophthalmic solution Place 2 drops into both eyes 3 (three) times daily as needed (dry eyes).      Results for orders placed or performed during the hospital encounter of 09/07/20 (from the past 48 hour(s))  Glucose, capillary     Status: Abnormal    Collection Time: 09/07/20  9:41 AM  Result Value Ref Range   Glucose-Capillary 113 (H) 70 - 99 mg/dL    Comment: Glucose reference range applies only to samples taken after fasting for at least 8 hours.   No results found.  Review of Systems  There were no vitals taken for this visit. Physical Exam HENT:     Mouth/Throat:     Mouth: Mucous membranes are moist.     Pharynx: Oropharynx is clear.  Eyes:     General: No scleral icterus.    Conjunctiva/sclera: Conjunctivae normal.  Cardiovascular:     Rate and Rhythm: Normal rate and regular rhythm.     Heart sounds: Normal heart sounds. No murmur heard. Pulmonary:     Effort: Pulmonary effort is normal.     Breath sounds: Normal breath sounds.  Abdominal:     General: There is no distension.     Palpations: Abdomen is soft. There is no mass.     Tenderness: There is no abdominal tenderness.  Musculoskeletal:        General: No swelling.     Cervical back: Neck supple.  Lymphadenopathy:     Cervical: No cervical adenopathy.  Skin:    General: Skin is warm and dry.  Neurological:     Mental Status: He is alert.     Assessment/Plan  History of colonic adenomas Family history of colon carcinoma in first-degree relative. Surveillance colonoscopy.  Hildred Laser, MD 09/07/2020, 9:49 AM

## 2020-09-07 NOTE — Anesthesia Procedure Notes (Signed)
Date/Time: 09/07/2020 10:04 AM Performed by: Orlie Dakin, CRNA Pre-anesthesia Checklist: Patient identified, Emergency Drugs available, Suction available and Patient being monitored Patient Re-evaluated:Patient Re-evaluated prior to induction Oxygen Delivery Method: Nasal cannula Induction Type: IV induction Placement Confirmation: positive ETCO2

## 2020-09-07 NOTE — Anesthesia Postprocedure Evaluation (Signed)
Anesthesia Post Note  Patient: Albert [name]  Procedure(s) Performed: COLONOSCOPY WITH PROPOFOL POLYPECTOMY  Patient location during evaluation: Phase II Anesthesia Type: General Level of consciousness: awake Pain management: pain level controlled Vital Signs Assessment: post-procedure vital signs reviewed and stable Respiratory status: spontaneous breathing and respiratory function stable Cardiovascular status: blood pressure returned to baseline and stable Postop Assessment: no headache and no apparent nausea or vomiting Anesthetic complications: no Comments: Late entry   No notable events documented.   Last Vitals:  Vitals:   09/07/20 0935 09/07/20 1045  BP: 118/71 118/66  Pulse: 67 71  Resp: 18 16  Temp: 36.7 C 36.9 C  SpO2: 97% 97%    Last Pain:  Vitals:   09/07/20 1045  TempSrc: Oral  PainSc: 0-No pain                 Louann Sjogren

## 2020-09-07 NOTE — Discharge Instructions (Addendum)
Do not take meloxicam unless absolutely necessary. Resume Eliquis tomorrow evening at usual dose. Resume other medications and diet as before. No driving for 24 hours. Physician will call with biopsy results.

## 2020-09-07 NOTE — Anesthesia Preprocedure Evaluation (Signed)
Anesthesia Evaluation  Patient identified by MRN, date of birth, ID band Patient awake    Reviewed: Allergy & Precautions, H&P , NPO status , Patient's Chart, lab work & pertinent test results, reviewed documented beta blocker date and time   Airway Mallampati: II  TM Distance: >3 FB Neck ROM: full    Dental no notable dental hx.    Pulmonary sleep apnea ,    Pulmonary exam normal breath sounds clear to auscultation       Cardiovascular Exercise Tolerance: Good hypertension, negative cardio ROS   Rhythm:regular Rate:Normal     Neuro/Psych negative neurological ROS  negative psych ROS   GI/Hepatic Neg liver ROS, GERD  Medicated,  Endo/Other  negative endocrine ROSdiabetes  Renal/GU negative Renal ROS  negative genitourinary   Musculoskeletal   Abdominal   Peds  Hematology negative hematology ROS (+)   Anesthesia Other Findings   Reproductive/Obstetrics negative OB ROS                             Anesthesia Physical Anesthesia Plan  ASA: 2  Anesthesia Plan: General   Post-op Pain Management:    Induction:   PONV Risk Score and Plan: Propofol infusion  Airway Management Planned:   Additional Equipment:   Intra-op Plan:   Post-operative Plan:   Informed Consent: I have reviewed the patients History and Physical, chart, labs and discussed the procedure including the risks, benefits and alternatives for the proposed anesthesia with the patient or authorized representative who has indicated his/her understanding and acceptance.     Dental Advisory Given  Plan Discussed with: CRNA  Anesthesia Plan Comments:         Anesthesia Quick Evaluation

## 2020-09-07 NOTE — Transfer of Care (Signed)
Immediate Anesthesia Transfer of Care Note  Patient: Albert [name]  Procedure(s) Performed: COLONOSCOPY WITH PROPOFOL POLYPECTOMY  Patient Location: Short Stay  Anesthesia Type:General  Level of Consciousness: awake, alert  and oriented  Airway & Oxygen Therapy: Patient Spontanous Breathing  Post-op Assessment: Report given to RN, Post -op Vital signs reviewed and stable and Patient moving all extremities X 4  Post vital signs: Reviewed and stable  Last Vitals:  Vitals Value Taken Time  BP    Temp    Pulse    Resp    SpO2      Last Pain:  Vitals:   09/07/20 0935  TempSrc: Oral  PainSc: 0-No pain      Patients Stated Pain Goal: 5 (19/80/22 1798)  Complications: No notable events documented.

## 2020-09-08 LAB — SURGICAL PATHOLOGY

## 2020-09-14 ENCOUNTER — Encounter (HOSPITAL_COMMUNITY): Payer: Self-pay | Admitting: Internal Medicine

## 2020-09-14 ENCOUNTER — Encounter (INDEPENDENT_AMBULATORY_CARE_PROVIDER_SITE_OTHER): Payer: Self-pay | Admitting: *Deleted

## 2020-10-21 ENCOUNTER — Ambulatory Visit (HOSPITAL_COMMUNITY): Payer: BC Managed Care – PPO | Admitting: Physician Assistant

## 2020-10-28 ENCOUNTER — Encounter (HOSPITAL_COMMUNITY): Payer: Self-pay | Admitting: Physician Assistant

## 2020-10-28 ENCOUNTER — Other Ambulatory Visit: Payer: Self-pay

## 2020-10-28 ENCOUNTER — Ambulatory Visit (HOSPITAL_COMMUNITY)
Admission: RE | Admit: 2020-10-28 | Discharge: 2020-10-28 | Disposition: A | Discharge status: Home / Self Care | Payer: BC Managed Care – PPO | Source: Ambulatory Visit | Attending: Physician Assistant | Admitting: Physician Assistant

## 2020-10-28 VITALS — BP 118/78 | HR 50 | Ht 71.0 in | Wt 310.8 lb

## 2020-10-28 DIAGNOSIS — Z79899 Other long term (current) drug therapy: Secondary | ICD-10-CM | POA: Insufficient documentation

## 2020-10-28 DIAGNOSIS — E119 Type 2 diabetes mellitus without complications: Secondary | ICD-10-CM | POA: Diagnosis not present

## 2020-10-28 DIAGNOSIS — E669 Obesity, unspecified: Secondary | ICD-10-CM | POA: Insufficient documentation

## 2020-10-28 DIAGNOSIS — Z6841 Body Mass Index (BMI) 40.0 and over, adult: Secondary | ICD-10-CM | POA: Insufficient documentation

## 2020-10-28 DIAGNOSIS — Z7901 Long term (current) use of anticoagulants: Secondary | ICD-10-CM | POA: Diagnosis not present

## 2020-10-28 DIAGNOSIS — I10 Essential (primary) hypertension: Secondary | ICD-10-CM | POA: Diagnosis not present

## 2020-10-28 DIAGNOSIS — Z881 Allergy status to other antibiotic agents status: Secondary | ICD-10-CM | POA: Diagnosis not present

## 2020-10-28 DIAGNOSIS — G4733 Obstructive sleep apnea (adult) (pediatric): Secondary | ICD-10-CM | POA: Insufficient documentation

## 2020-10-28 DIAGNOSIS — Z888 Allergy status to other drugs, medicaments and biological substances status: Secondary | ICD-10-CM | POA: Insufficient documentation

## 2020-10-28 DIAGNOSIS — I480 Paroxysmal atrial fibrillation: Secondary | ICD-10-CM | POA: Diagnosis present

## 2020-10-28 DIAGNOSIS — K219 Gastro-esophageal reflux disease without esophagitis: Secondary | ICD-10-CM | POA: Diagnosis not present

## 2020-10-28 DIAGNOSIS — D6869 Other thrombophilia: Secondary | ICD-10-CM | POA: Diagnosis not present

## 2020-10-28 NOTE — Progress Notes (Signed)
Primary Care Physician: Monico Blitz, MD Primary Cardiologist: Dr Stanford Breed Primary Electrophysiologist: none Referring Physician: Dr Clifton James Albert [name] is a 50 y.o. male with a history of DM, HTN, GERD, OSA, and paroxysmal atrial fibrillation who presents for follow up in the [facility]. The patient was initially diagnosed with atrial fibrillation 12/25/19 after presenting to the ED with symptoms of palpitations, SOB, and some chest heaviness. He states he had brief, intermittent palpitations for two weeks prior. Patient was started on Eliquis for a CHADS2VASC score of 2. He converted back to SR spontaneously in the ED. Since then, he has had some mild palpitations almost daily. He did have another extended episode of heart racing on 10/8 which resolved when he took his BB. He is compliant with his CPAP and denies any alcohol use.   On follow up today, patient reports that he has done well since his last visit. He occasionally has palpitations but these are brief. He denies any bleeding issues on anticoagulation. He continues to do well being active.   Today, he denies symptoms of chest pain, shortness of breath, orthopnea, PND, lower extremity edema, dizziness, presyncope, syncope, bleeding, or neurologic sequela. The patient is tolerating medications without difficulties and is otherwise without complaint today.    Atrial Fibrillation Risk Factors:  he does have symptoms or diagnosis of sleep apnea. he is compliant with CPAP therapy. he does not have a history of rheumatic fever. he does not have a history of alcohol use. The patient does not have a history of early familial atrial fibrillation or other arrhythmias.  he has a BMI of Body mass index is 43.35 kg/m.[name] Filed Weights   10/28/20 0833  Weight: (!) 141 kg     Family History  Problem Relation Age of Onset   Colon cancer Father      Atrial Fibrillation Management  history:  Previous antiarrhythmic drugs: Multaq Previous cardioversions: none Previous ablations: none CHADS2VASC score: 2 Anticoagulation history: Eliquis   Past Medical History:  Diagnosis Date   Diabetes mellitus without complication (HCC)    GERD (gastroesophageal reflux disease)    Gout    Hypertension    Low testosterone    Sleep apnea    Past Surgical History:  Procedure Laterality Date   CARDIAC CATHETERIZATION     COLONOSCOPY     COLONOSCOPY N/A 05/11/2015   Procedure: COLONOSCOPY;  Surgeon: Rogene Houston, MD;  [facility];  Service: Endoscopy;  Laterality: N/A;  930 - moved to 2/15 - Ann to notify   COLONOSCOPY WITH PROPOFOL N/A 09/07/2020   Procedure: COLONOSCOPY WITH PROPOFOL;  Surgeon: Rogene Houston, MD;  [facility];  Service: Endoscopy;  Laterality: N/A;  am   POLYPECTOMY  09/07/2020   Procedure: POLYPECTOMY;  Surgeon: Rogene Houston, MD;  [facility];  Service: Endoscopy;;    Current Outpatient Medications  Medication Sig Dispense Refill   acetaminophen (TYLENOL) 650 MG CR tablet Take 650 mg by mouth every 8 (eight) hours as needed for pain.     apixaban (ELIQUIS) 5 MG TABS tablet Take 1 tablet (5 mg total) by mouth 2 (two) times daily. 60 tablet 11   Cholecalciferol (DIALYVITE VITAMIN D 5000) 125 MCG (5000 UT) capsule Take 5,000 Units by mouth daily.     clobetasol cream (TEMOVATE) [id number] % Apply 1 application topically 2 (two) times daily as needed (itching).     colchicine 0.6 MG tablet  Take 0.6 mg by mouth 2 (two) times daily as needed (gout flare up).      diltiazem (CARDIZEM) 30 MG tablet Take 1 tablet every 4 hours AS NEEDED for AFIB heart rate >100 30 tablet 1   febuxostat (ULORIC) 40 MG tablet Take 40 mg by mouth daily.     fluticasone (FLONASE) 50 MCG/ACT nasal spray Place 1 spray into both nostrils daily as needed for allergies or rhinitis.     Magnesium 200 MG TABS Take 800 mg by mouth daily. Taking 4 tablets by  mouth daily- '800mg'$  total     Melatonin 10 MG CAPS Take 10 mg by mouth at bedtime.     meloxicam (MOBIC) 15 MG tablet Take 15 mg by mouth as needed for pain.      metoprolol tartrate (LOPRESSOR) 25 MG tablet Take 1 tablet (25 mg total) by mouth 2 (two) times daily. 180 tablet 2   MULTAQ 400 MG tablet TAKE 1 TABLET BY MOUTH 2 TIMES A DAY WITH MEALS. 60 tablet 6   pantoprazole (PROTONIX) 40 MG tablet Take 1 tablet (40 mg total) by mouth daily. 30 tablet 0   Potassium 99 MG TABS Take 99 mg by mouth every evening.     tadalafil (CIALIS) 20 MG tablet Take 20 mg by mouth daily as needed for erectile dysfunction.     testosterone cypionate (DEPOTESTOSTERONE CYPIONATE) 200 MG/ML injection Inject 200 mg into the muscle every 14 (fourteen) days.     tetrahydrozoline-zinc (VISINE-AC) 0.05-0.25 % ophthalmic solution Place 2 drops into both eyes 3 (three) times daily as needed (dry eyes).     No current facility-administered medications for this encounter.    Allergies  Allergen Reactions   Penicillins Other (See Comments)    Unknown childhood   Levaquin [Levofloxacin] Palpitations    Social History   Socioeconomic History   Marital status: Married    Spouse name: Not on file   Number of children: Not on file   Years of education: Not on file   Highest education level: Not on file  Occupational History   Not on file  Tobacco Use   Smoking status: Never   Smokeless tobacco: Never  Vaping Use   Vaping Use: Never used  Substance and Sexual Activity   Alcohol use: Not Currently    Comment: occasionally   Drug use: No   Sexual activity: Not on file  Other Topics Concern   Not on file  Social History Narrative   Not on file   Social Determinants of Health   Financial Resource Strain: Not on file  Food Insecurity: Not on file  Transportation Needs: Not on file  Physical Activity: Not on file  Stress: Not on file  Social Connections: Not on file  Intimate Partner Violence: Not on file      ROS- All systems are reviewed and negative except as per the HPI above.  Physical Exam: Vitals:   10/28/20 0833  BP: 118/78  Pulse: (!) 50  Weight: (!) 141 kg  Height: '5\' 11"'$  (1.803 m)    GEN- The patient is a well appearing obese male, alert and oriented x 3 today.   HEENT-head normocephalic, atraumatic, sclera clear, conjunctiva pink, hearing intact, trachea midline. Lungs- Clear to ausculation bilaterally, normal work of breathing Heart- Regular rate and rhythm, bradycardia, no murmurs, rubs or gallops  GI- soft, NT, ND, + BS Extremities- no clubbing, cyanosis, or edema MS- no significant deformity or atrophy Skin- no rash or lesion  Psych- euthymic mood, full affect Neuro- strength and sensation are intact   Wt Readings from Last 3 Encounters:  10/28/20 (!) 141 kg  04/22/20 (!) 143.2 kg  01/15/20 (!) 145.6 kg    EKG today demonstrates  SB Vent. rate 50 BPM PR interval 206 ms QRS duration 104 ms QT/QTcB 444/404 ms  Echo 12/26/19 demonstrated   1. Extremely limited due to poor sound wave transmission; definity used; normal LV systolic and diastolic function; moderate LAE.   2. Left ventricular ejection fraction, by estimation, is 55 to 60%. The  left ventricle has normal function. The left ventricle has no regional  wall motion abnormalities. Left ventricular diastolic parameters were  normal.   3. Right ventricular systolic function is normal. The right ventricular  size is normal.   4. Left atrial size was moderately dilated.   5. The mitral valve is normal in structure. No evidence of mitral valve  regurgitation. No evidence of mitral stenosis.   6. The aortic valve is tricuspid. Aortic valve regurgitation is not  visualized. No aortic stenosis is present.   Epic records are reviewed at length today  CHA2DS2-VASc Score = 2  The patient's score is based upon: CHF History: No HTN History: Yes Diabetes History: Yes Stroke History: No Vascular Disease  History: No Age Score: 0 Gender Score: 0      ASSESSMENT AND PLAN: 1. Paroxysmal Atrial Fibrillation (ICD10:  I48.0) The patient's CHA2DS2-VASc score is 2, indicating a 2.2% annual risk of stroke.   Patient appears to be maintaining SR. If he fails Multaq, can consider flecainide vs ablation.  Continue Eliquis 5 mg BID Continue Lopressor 25 mg BID Continue Multaq 400 mg BID Continue diltiazem 30 mg PRN q 4 hours for heat racing.   2. Secondary Hypercoagulable State (ICD10:  D68.69) The patient is at significant risk for stroke/thromboembolism based upon his CHA2DS2-VASc Score of 2.  Continue Apixaban (Eliquis).   3. Obesity Body mass index is 43.35 kg/m. Lifestyle modification was discussed and encouraged including regular physical activity and weight reduction. Patient continues to lose weight, congratulated.   4. Obstructive sleep apnea Patient reports compliance with CPAP therapy.  5. HTN Stable, no changes today.   Follow up in the AF clinic in 6 months.    [facility] [address] [place], [place] [address] [phone] 10/28/2020 9:21 AM

## 2020-11-08 ENCOUNTER — Other Ambulatory Visit (HOSPITAL_COMMUNITY): Payer: Self-pay

## 2020-11-08 MED ORDER — MULTAQ 400 MG PO TABS: 400.0000 mg | ORAL_TABLET | Freq: Two times a day (BID) | ORAL | 0 refills | Status: DC

## 2020-11-28 ENCOUNTER — Emergency Department (HOSPITAL_COMMUNITY): Payer: BC Managed Care – PPO

## 2020-11-28 ENCOUNTER — Observation Stay (HOSPITAL_COMMUNITY): Payer: BC Managed Care – PPO

## 2020-11-28 ENCOUNTER — Observation Stay (HOSPITAL_COMMUNITY)
Admission: EM | Admit: 2020-11-28 | Discharge: 2020-11-29 | Disposition: A | Discharge status: Home / Self Care | Payer: BC Managed Care – PPO | Attending: Internal Medicine | Admitting: Internal Medicine

## 2020-11-28 ENCOUNTER — Encounter (HOSPITAL_COMMUNITY): Payer: Self-pay | Admitting: *Deleted

## 2020-11-28 DIAGNOSIS — R2990 Unspecified symptoms and signs involving the nervous system: Secondary | ICD-10-CM

## 2020-11-28 DIAGNOSIS — I10 Essential (primary) hypertension: Secondary | ICD-10-CM | POA: Diagnosis present

## 2020-11-28 DIAGNOSIS — Z7901 Long term (current) use of anticoagulants: Secondary | ICD-10-CM | POA: Diagnosis not present

## 2020-11-28 DIAGNOSIS — R200 Anesthesia of skin: Secondary | ICD-10-CM | POA: Diagnosis present

## 2020-11-28 DIAGNOSIS — Y900 Blood alcohol level of less than 20 mg/100 ml: Secondary | ICD-10-CM | POA: Diagnosis not present

## 2020-11-28 DIAGNOSIS — Z79899 Other long term (current) drug therapy: Secondary | ICD-10-CM | POA: Diagnosis not present

## 2020-11-28 DIAGNOSIS — R531 Weakness: Secondary | ICD-10-CM | POA: Diagnosis not present

## 2020-11-28 DIAGNOSIS — E1122 Type 2 diabetes mellitus with diabetic chronic kidney disease: Secondary | ICD-10-CM | POA: Insufficient documentation

## 2020-11-28 DIAGNOSIS — Z20822 Contact with and (suspected) exposure to covid-19: Secondary | ICD-10-CM | POA: Insufficient documentation

## 2020-11-28 DIAGNOSIS — R202 Paresthesia of skin: Secondary | ICD-10-CM | POA: Diagnosis present

## 2020-11-28 DIAGNOSIS — I639 Cerebral infarction, unspecified: Secondary | ICD-10-CM | POA: Diagnosis not present

## 2020-11-28 DIAGNOSIS — I480 Paroxysmal atrial fibrillation: Secondary | ICD-10-CM | POA: Diagnosis not present

## 2020-11-28 DIAGNOSIS — I129 Hypertensive chronic kidney disease with stage 1 through stage 4 chronic kidney disease, or unspecified chronic kidney disease: Secondary | ICD-10-CM | POA: Diagnosis not present

## 2020-11-28 DIAGNOSIS — N1832 Chronic kidney disease, stage 3b: Secondary | ICD-10-CM | POA: Insufficient documentation

## 2020-11-28 DIAGNOSIS — R55 Syncope and collapse: Secondary | ICD-10-CM

## 2020-11-28 HISTORY — DX: Unspecified atrial fibrillation: I48.91

## 2020-11-28 LAB — COMPREHENSIVE METABOLIC PANEL
ALT: 36 U/L (ref 0–44)
AST: 34 U/L (ref 15–41)
Albumin: 4.5 g/dL (ref 3.5–5.0)
Alkaline Phosphatase: 42 U/L (ref 38–126)
Anion gap: 6 (ref 5–15)
BUN: 26 mg/dL — ABNORMAL HIGH (ref 6–20)
CO2: 25 mmol/L (ref 22–32)
Calcium: 8.7 mg/dL — ABNORMAL LOW (ref 8.9–10.3)
Chloride: 106 mmol/L (ref 98–111)
Creatinine, Ser: 1.75 mg/dL — ABNORMAL HIGH (ref 0.61–1.24)
GFR, Estimated: 47 mL/min — ABNORMAL LOW (ref >60)
Glucose, Bld: 145 mg/dL — ABNORMAL HIGH (ref 70–99)
Potassium: 4 mmol/L (ref 3.5–5.1)
Sodium: 137 mmol/L (ref 135–145)
Total Bilirubin: 0.4 mg/dL (ref 0.3–1.2)
Total Protein: 7.6 g/dL (ref 6.5–8.1)

## 2020-11-28 LAB — DIFFERENTIAL
Abs Immature Granulocytes: 0.03 10*3/uL (ref 0.00–0.07)
Basophils Absolute: 0.1 10*3/uL (ref 0.0–0.1)
Basophils Relative: 1 %
Eosinophils Absolute: 0.3 10*3/uL (ref 0.0–0.5)
Eosinophils Relative: 3 %
Immature Granulocytes: 0 %
Lymphocytes Relative: 24 %
Lymphs Abs: 2.1 10*3/uL (ref 0.7–4.0)
Monocytes Absolute: 0.7 10*3/uL (ref 0.1–1.0)
Monocytes Relative: 8 %
Neutro Abs: 5.6 10*3/uL (ref 1.7–7.7)
Neutrophils Relative %: 64 %

## 2020-11-28 LAB — CBC
HCT: 52.3 % — ABNORMAL HIGH (ref 39.0–52.0)
Hemoglobin: 18.3 g/dL — ABNORMAL HIGH (ref 13.0–17.0)
MCH: 32.3 pg (ref 26.0–34.0)
MCHC: 35 g/dL (ref 30.0–36.0)
MCV: 92.2 fL (ref 80.0–100.0)
Platelets: 231 10*3/uL (ref 150–400)
RBC: 5.67 MIL/uL (ref 4.22–5.81)
RDW: 13.6 % (ref 11.5–15.5)
WBC: 8.7 10*3/uL (ref 4.0–10.5)
nRBC: 0 % (ref 0.0–0.2)

## 2020-11-28 LAB — URINALYSIS, ROUTINE W REFLEX MICROSCOPIC
Bilirubin Urine: NEGATIVE
Glucose, UA: NEGATIVE mg/dL
Hgb urine dipstick: NEGATIVE
Ketones, ur: NEGATIVE mg/dL
Leukocytes,Ua: NEGATIVE
Nitrite: NEGATIVE
Protein, ur: NEGATIVE mg/dL
Specific Gravity, Urine: 1.02 (ref 1.005–1.030)
pH: 5.5 (ref 5.0–8.0)

## 2020-11-28 LAB — ETHANOL: Alcohol, Ethyl (B): <10 mg/dL (ref <10)

## 2020-11-28 LAB — PROTIME-INR
INR: 1.1 (ref 0.8–1.2)
Prothrombin Time: 14.3 seconds (ref 11.4–15.2)

## 2020-11-28 LAB — CBG MONITORING, ED: Glucose-Capillary: 159 mg/dL — ABNORMAL HIGH (ref 70–99)

## 2020-11-28 LAB — RAPID URINE DRUG SCREEN, HOSP PERFORMED
Amphetamines: NOT DETECTED
Barbiturates: NOT DETECTED
Benzodiazepines: NOT DETECTED
Cocaine: NOT DETECTED
Opiates: NOT DETECTED
Tetrahydrocannabinol: POSITIVE — AB

## 2020-11-28 LAB — APTT: aPTT: 33 seconds (ref 24–36)

## 2020-11-28 LAB — RESP PANEL BY RT-PCR (FLU A&B, COVID) ARPGX2
Influenza A by PCR: NEGATIVE
Influenza B by PCR: NEGATIVE
SARS Coronavirus 2 by RT PCR: NEGATIVE

## 2020-11-28 MED ORDER — ACETAMINOPHEN 650 MG RE SUPP: 650.0000 mg | RECTAL | Status: DC | PRN

## 2020-11-28 MED ORDER — SENNOSIDES-DOCUSATE SODIUM 8.6-50 MG PO TABS: 1.0000 | ORAL_TABLET | Freq: Every evening | ORAL | Status: DC | PRN

## 2020-11-28 MED ORDER — ATORVASTATIN CALCIUM 40 MG PO TABS
40.0000 mg | ORAL_TABLET | Freq: Every day | ORAL | Status: DC
  Administered 2020-11-28 – 2020-11-29 (×2): 40 mg via ORAL
  Filled 2020-11-28 (×2): qty 1

## 2020-11-28 MED ORDER — PANTOPRAZOLE SODIUM 40 MG PO TBEC
40.0000 mg | DELAYED_RELEASE_TABLET | Freq: Every day | ORAL | Status: DC
  Administered 2020-11-29: 40 mg via ORAL
  Filled 2020-11-28: qty 1

## 2020-11-28 MED ORDER — ACETAMINOPHEN 160 MG/5ML PO SOLN: 650.0000 mg | ORAL | Status: DC | PRN

## 2020-11-28 MED ORDER — FEBUXOSTAT 40 MG PO TABS
40.0000 mg | ORAL_TABLET | Freq: Every day | ORAL | Status: DC
  Administered 2020-11-28 – 2020-11-29 (×2): 40 mg via ORAL
  Filled 2020-11-28 (×2): qty 1

## 2020-11-28 MED ORDER — MELATONIN 5 MG PO TABS
10.0000 mg | ORAL_TABLET | Freq: Every day | ORAL | Status: DC
  Administered 2020-11-28: 10 mg via ORAL
  Filled 2020-11-28: qty 2

## 2020-11-28 MED ORDER — GADOBUTROL 1 MMOL/ML IV SOLN
10.0000 mL | Freq: Once | INTRAVENOUS | Status: AC | PRN
  Administered 2020-11-28: 10 mL via INTRAVENOUS

## 2020-11-28 MED ORDER — DRONEDARONE HCL 400 MG PO TABS
400.0000 mg | ORAL_TABLET | Freq: Two times a day (BID) | ORAL | Status: DC
  Administered 2020-11-29 (×2): 400 mg via ORAL
  Filled 2020-11-28 (×4): qty 1

## 2020-11-28 MED ORDER — STROKE: EARLY STAGES OF RECOVERY BOOK
Freq: Once | Status: AC
  Filled 2020-11-28: qty 1

## 2020-11-28 MED ORDER — APIXABAN 5 MG PO TABS
5.0000 mg | ORAL_TABLET | Freq: Two times a day (BID) | ORAL | Status: DC
  Administered 2020-11-28 – 2020-11-29 (×2): 5 mg via ORAL
  Filled 2020-11-28 (×2): qty 1

## 2020-11-28 MED ORDER — ACETAMINOPHEN 325 MG PO TABS: 650.0000 mg | ORAL_TABLET | ORAL | Status: DC | PRN

## 2020-11-28 NOTE — H&P (Signed)
History and Physical    Albert [name] [id number] DOB: 10-10-1970 DOA: 11/28/2020  PCP: Albert Blitz, MD   Patient coming from: Home  I have personally briefly reviewed patient's old medical records in [place]  Chief Complaint: Right sided numbness  HPI: Albert [name] is a 50 y.o. male with medical history significant for paroxysmal atrial fibrillation, hypertension. Patient presented to ED with complaints of passing out and right-sided numbness.  Patient reports that he was sitting down watching TV with his wife, when he believes he suddenly passed out probably for about 10 seconds and then came to and said "what just happened".  His wife was unaware of what had happened to him until when he said that. When he came to he noticed that the right side of his face was quite numb, to a lesser extent his extremities, but he also noticed a heaviness involving the right side of his body, requiring more effort to move his right side. At the time of my evaluation, numbness and heaviness have significantly improved. No facial asymmetry was noted, no slurred speech.  No prior strokes. Patient is on Eliquis and compliant, he took his medication this morning.  Reports an episode of palpitations/atrial fibrillation caught on his wristwatch, heart rate of 120s to 130s, that self terminated after taking his PRN diltiazem.  ED Course: Temperature 99.7.  Heart rate 95-103.  Blood pressure systolic [id number].  UDS positive for marijuana.  Creatinine stable at 1.75.  Head CT - asymmetric hypodensity within left subinsular white matter. Code stroke was called, was evaluated by neurologist Dr. Curly Shores, recommended holding Eliquis until MRI reviewed.  Urgent MRI recommended, unable to do MRI today here at [facility] hence patient was admitted to [facility].  Review of Systems: As per HPI all other systems reviewed and negative.  Past Medical History:  Diagnosis Date   Atrial fibrillation  ([place])    Diabetes mellitus without complication (HCC)    GERD (gastroesophageal reflux disease)    Gout    Hypertension    Low testosterone    Sleep apnea     Past Surgical History:  Procedure Laterality Date   CARDIAC CATHETERIZATION     COLONOSCOPY     COLONOSCOPY N/A 05/11/2015   Procedure: COLONOSCOPY;  Surgeon: Rogene Houston, MD;  [facility];  Service: Endoscopy;  Laterality: N/A;  930 - moved to 2/15 - Ann to notify   COLONOSCOPY WITH PROPOFOL N/A 09/07/2020   Procedure: COLONOSCOPY WITH PROPOFOL;  Surgeon: Rogene Houston, MD;  [facility];  Service: Endoscopy;  Laterality: N/A;  am   POLYPECTOMY  09/07/2020   Procedure: POLYPECTOMY;  Surgeon: Rogene Houston, MD;  [facility];  Service: Endoscopy;;     reports that he has never smoked. He has never used smokeless tobacco. He reports that he does not currently use alcohol. He reports that he does not use drugs.  Allergies  Allergen Reactions   Penicillins Other (See Comments)    Unknown childhood   Levaquin [Levofloxacin] Palpitations    Family History  Problem Relation Age of Onset   Colon cancer Father     Prior to Admission medications   Medication Sig Start Date End Date Taking? Authorizing Provider  acetaminophen (TYLENOL) 650 MG CR tablet Take 650 mg by mouth every 8 (eight) hours as needed for pain.   Yes [provider]  apixaban (ELIQUIS) 5 MG TABS tablet Take 1 tablet (5 mg  total) by mouth 2 (two) times daily. 09/08/20  Yes Rehman, Mechele Dawley, MD  colchicine 0.6 MG tablet Take 0.6 mg by mouth 2 (two) times daily as needed (gout flare up).  05/16/19  Yes [provider]  diltiazem (CARDIZEM) 30 MG tablet Take 1 tablet every 4 hours AS NEEDED for AFIB heart rate >100 03/04/20  Yes Fenton, Clint R, PA  dronedarone (MULTAQ) 400 MG tablet Take 1 tablet (400 mg total) by mouth 2 (two) times daily with a meal. 11/08/20  Yes Fenton, Clint R, PA  febuxostat (ULORIC)  40 MG tablet Take 40 mg by mouth daily.   Yes [provider]  Magnesium 200 MG TABS Take 800 mg by mouth daily. Taking 4 tablets by mouth daily- '800mg'$  total   Yes [provider]  Melatonin 10 MG CAPS Take 10 mg by mouth at bedtime.   Yes [provider]  meloxicam (MOBIC) 15 MG tablet Take 15 mg by mouth as needed for pain.  12/16/19  Yes [provider]  metoprolol tartrate (LOPRESSOR) 25 MG tablet Take 1 tablet (25 mg total) by mouth 2 (two) times daily. 04/22/20  Yes Fenton, Clint R, PA  pantoprazole (PROTONIX) 40 MG tablet Take 1 tablet (40 mg total) by mouth daily. 09/24/19  Yes Long, Wonda Olds, MD  Potassium 99 MG TABS Take 99 mg by mouth every evening.   Yes [provider]  tadalafil (CIALIS) 20 MG tablet Take 20 mg by mouth daily as needed for erectile dysfunction.   Yes [provider]  testosterone cypionate (DEPOTESTOSTERONE CYPIONATE) 200 MG/ML injection Inject 200 mg into the muscle every 14 (fourteen) days.   Yes [provider]  vitamin B-12 (CYANOCOBALAMIN) 100 MCG tablet Take 100 mcg by mouth daily.   Yes [provider]  Cholecalciferol (DIALYVITE VITAMIN D 5000) 125 MCG (5000 UT) capsule Take 5,000 Units by mouth daily. Patient not taking: Reported on 11/28/2020    [provider]  clobetasol cream (TEMOVATE) [id number] % Apply 1 application topically 2 (two) times daily as needed (itching). Patient not taking: Reported on 11/28/2020    [provider]  fluticasone (FLONASE) 50 MCG/ACT nasal spray Place 1 spray into both nostrils daily as needed for allergies or rhinitis. Patient not taking: Reported on 11/28/2020    [provider]  tetrahydrozoline-zinc (VISINE-AC) 0.05-0.25 % ophthalmic solution Place 2 drops into both eyes 3 (three) times daily as needed (dry eyes). Patient not taking: Reported on 11/28/2020    [provider]    Physical Exam: Vitals:   11/28/20 1450 11/28/20 1500  11/28/20 1511 11/28/20 1512  BP: (!) 143/66 (!) 156/73  (!) 141/63  Pulse: (!) 103 99    Resp: '18 16  19  '$ Temp:   99.4 F (37.4 C)   TempSrc:   Oral   SpO2: 97% 99%      Constitutional: NAD, calm, comfortable Vitals:   11/28/20 1450 11/28/20 1500 11/28/20 1511 11/28/20 1512  BP: (!) 143/66 (!) 156/73  (!) 141/63  Pulse: (!) 103 99    Resp: '18 16  19  '$ Temp:   99.4 F (37.4 C)   TempSrc:   Oral   SpO2: 97% 99%     Eyes: PERRL, lids and conjunctivae normal ENMT: Mucous membranes are moist.  Neck: normal, supple, no masses, no thyromegaly Respiratory: clear to auscultation bilaterally, no wheezing, no crackles. Normal respiratory effort. No accessory muscle use.  Cardiovascular: Regular rate and rhythm, no murmurs /  rubs / gallops. No extremity edema. 2+ pedal pulses.  Abdomen: no tenderness, no masses palpated. No hepatosplenomegaly. Bowel sounds positive.  Musculoskeletal: no clubbing / cyanosis. No joint deformity upper and lower extremities. Good ROM, no contractures. Normal muscle tone.  Skin: no rashes, lesions, ulcers. No induration Neurologic:  Neurological:     Mental Status: he is alert.     GCS: GCS eye subscore is 4. GCS verbal subscore is 5. GCS motor subscore is 6.     Comments: Mental Status:  Alert, oriented, thought content appropriate, able to give a coherent history. Speech fluent without evidence of aphasia. Able to follow 2 step commands without difficulty.  Cranial Nerves:  II:   pupils equal, round, reactive to light III,IV, VI: ptosis not present, extra-ocular motions intact bilaterally  V,VII: smile symmetric, eyebrows raise symmetric, facial light touch sensation equal VIII: hearing grossly normal to voice  X:   XI: bilateral shoulder shrug symmetric and strong XII: midline tongue extension without fassiculations Motor:  Normal tone.   5/5 strength in bilat upper and lower extremities bilaterally  Sensory: Sensation intact to light touch in all  extremities and face  Cerebellar:  CV: distal pulses palpable throughout   Psychiatric: Normal judgment and insight. Alert and oriented x 3. Normal mood.   Labs on Admission: I have personally reviewed following labs and imaging studies  CBC: Recent Labs  Lab 11/28/20 1514  WBC 8.7  NEUTROABS 5.6  HGB 18.3*  HCT 52.3*  MCV 92.2  PLT [id number]   Basic Metabolic Panel: Recent Labs  Lab 11/28/20 1514  NA 137  K 4.0  CL 106  CO2 25  GLUCOSE 145*  BUN 26*  CREATININE 1.75*  CALCIUM 8.7*   GFR: CrCl cannot be calculated (Unknown ideal weight.). Liver Function Tests: Recent Labs  Lab 11/28/20 1514  AST 34  ALT 36  ALKPHOS 42  BILITOT 0.4  PROT 7.6  ALBUMIN 4.5   Coagulation Profile: Recent Labs  Lab 11/28/20 1514  INR 1.1   CBG: Recent Labs  Lab 11/28/20 1446  GLUCAP 159*   Urine analysis:    Component Value Date/Time   COLORURINE YELLOW 11/28/2020 [place] 11/28/2020 1451   LABSPEC 1.020 11/28/2020 1451   PHURINE 5.5 11/28/2020 1451   GLUCOSEU NEGATIVE 11/28/2020 1451   [place] 11/28/2020 [place] 11/28/2020 1451   KETONESUR NEGATIVE 11/28/2020 1451   PROTEINUR NEGATIVE 11/28/2020 1451   NITRITE NEGATIVE 11/28/2020 1451   LEUKOCYTESUR NEGATIVE 11/28/2020 1451    Radiological Exams on Admission: CT HEAD CODE STROKE WO CONTRAST  Result Date: 11/28/2020 CLINICAL DATA:  Code stroke. Neuro deficit, acute, stroke suspected. Additional history provided: Syncopal episode 1 hour ago, patient awoke with right-sided numbness. EXAM: CT HEAD WITHOUT CONTRAST TECHNIQUE: Contiguous axial images were obtained from the base of the skull through the vertex without intravenous contrast. COMPARISON:  No pertinent prior exams available for comparison. FINDINGS: Brain: Cerebral volume is normal. Subtle asymmetric hypodensity is questioned within the anterior left subinsular white matter which could reflect an age-indeterminate  lacunar infarct (for instance as seen on series 4, image 31). There is no acute intracranial hemorrhage. No demarcated cortical infarct. No extra-axial fluid collection. No evidence of an intracranial mass. No midline shift. Vascular: No hyperdense vessel. Skull: Normal. Negative for fracture or focal lesion. Sinuses/Orbits: Visualized orbits show no acute finding. No significant paranasal sinus disease at the imaged levels. Other: Periapical lucency surrounding the right mandibular second  and third molars, partially imaged. ASPECTS ([place] Stroke Program Early CT Score) - Ganglionic level infarction (caudate, lentiform nuclei, internal capsule, insula, M1-M3 cortex): 7 - Supraganglionic infarction (M4-M6 cortex): 3 Total score (0-10 with 10 being normal): 10 These results were called by telephone at the time of interpretation on 11/28/2020 at 3:03 pm to provider [facility] , who verbally acknowledged these results. IMPRESSION: Subtle asymmetric hypodensity questioned within the anterior left subinsular white matter, which could reflect an age-indeterminate lacunar infarct. Consider a brain MRI for further evaluation. No acute demarcated cortical infarct. No evidence of acute intracranial hemorrhage. Electronically Signed   By: Kellie Simmering D.O.   On: 11/28/2020 15:03    EKG: Independently reviewed.  Sinus tachycardia rate 105.  QTc 452.  No significant change from prior.  Artifacts present.  Assessment/Plan Principal Problem:   Right sided numbness Active Problems:   Essential hypertension   Paroxysmal atrial fibrillation (HCC)   Right-sided numbness and weakness-symptoms improving at this time.  Exam unremarkable at this time.  Head CT suggests lacunar infarcts age-indeterminate. -Code stroke called, evaluated by neurologist, recommended getting MRI brain before resuming anticoagulation.  Work-up to include MRA head and neck, Hold Eliquis for tonight until reviewed by neurology.  Aspirin 325 mg  daily starting tomorrow continue Eliquis resumed. - Obtain Echocardiogram - Lipid panel, [id number] --EEG -Permissive hypertension to 220/110 overnight - Atorvastatin '40mg'$  daily, resume febuxostat  Hypertension-  -Hold metoprolol, diltiazem - PRN Labetalol for bp > 220/110  Paroxysmal atrial fibrillation-currently in sinus rhythm. -Eliquis held for now pending MRI - Resume dronedarone -Metoprolol diltiazem held for now, hold PRN dilatiazem  DVT prophylaxis: Scds for now Code Status: Full code Family Communication: Spouse at bedside Disposition Plan: ~ 1 -2 days Consults called: neurology Admission status: Obs tele   Bethena Roys MD [facility]  11/28/2020, 8:46 PM

## 2020-11-28 NOTE — ED Notes (Signed)
Verbal order received from Dr. Matilde Sprang to be able to give pt something to drink since he passed stroke swallow. Water at bedside.

## 2020-11-28 NOTE — Consult Note (Addendum)
[facility] Telemedicine Consult  Requesting Provider: Dr. Maureen Ralphs Consult Participants: Bedside nursing staff, patient, wife, myself, atrium nurse Mardene Celeste Location of the provider: [facility] Location of the patient: [facility]  This consult was provided via telemedicine with 2-way video and audio communication. The patient/family was informed that care would be provided in this way and agreed to receive care in this manner.    Chief Complaint: Syncope followed by right-sided weakness/numbness  HPI: Mr. Albert [name] is a 50 year old right-handed man with past medical history significant for paroxysmal atrial fibrillation (on Eliquis, last dose 9/5 morning), hypertension, diabetes, obstructive sleep apnea on CPAP, obesity (BMI 43.35), ocular migraines.  He reports he has been in his completely normal state of health but at 2 PM he suddenly had a syncopal episode without warning without only for a few seconds before he came to without any residual confusion although he did have some difficulty feeling like his right side was out moving as well and felt like it was off sensation wise.  He was transported to [facility] ED by his wife where code stroke was activated.  On review of systems he notes he did have a brief episode of paroxysmal atrial fibrillation which self terminated in the last 2 days, as well as an ocular migraine which is unusual with his last ocular migraine happening 3 to 4 years ago.  Denies any other recent transient neurological symptoms  Past Medical History:  Diagnosis Date   Atrial fibrillation ([place])    Diabetes mellitus without complication (HCC)    GERD (gastroesophageal reflux disease)    Gout    Hypertension    Low testosterone    Sleep apnea    Past Surgical History:  Procedure Laterality Date   CARDIAC CATHETERIZATION     COLONOSCOPY     COLONOSCOPY N/A 05/11/2015   Procedure: COLONOSCOPY;  Surgeon: Rogene Houston, MD;  [facility];  Service: Endoscopy;  Laterality: N/A;  930 - moved to 2/15 - Ann to notify   COLONOSCOPY WITH PROPOFOL N/A 09/07/2020   Procedure: COLONOSCOPY WITH PROPOFOL;  Surgeon: Rogene Houston, MD;  Location: [facility];  Service: Endoscopy;  Laterality: N/A;  am   POLYPECTOMY  09/07/2020   Procedure: POLYPECTOMY;  Surgeon: Rogene Houston, MD;  [facility]: [facility];  Service: Endoscopy;;   Current Outpatient Medications  Medication Instructions   acetaminophen (TYLENOL) 650 mg, Oral, Every 8 hours PRN   clobetasol cream (TEMOVATE) AB-123456789 % 1 application, Topical, 2 times daily PRN   colchicine 0.6 mg, Oral, 2 times daily PRN   Dialyvite Vitamin D 5000 5,000 Units, Oral, Daily   diltiazem (CARDIZEM) 30 MG tablet Take 1 tablet every 4 hours AS NEEDED for AFIB heart rate >100   Eliquis 5 mg, Oral, 2 times daily   febuxostat (ULORIC) 40 mg, Oral, Daily   fluticasone (FLONASE) 50 MCG/ACT nasal spray 1 spray, Each Nare, Daily PRN   Magnesium 800 mg, Oral, Daily, Taking 4 tablets by mouth daily- '800mg'$  total   Melatonin 10 mg, Oral, Daily at bedtime   meloxicam (MOBIC) 15 mg, Oral, As needed   metoprolol tartrate (LOPRESSOR) 25 mg, Oral, 2 times daily   Multaq 400 mg, Oral, 2 times daily with meals   pantoprazole (PROTONIX) 40 mg, Oral, Daily   Potassium 99 mg, Oral, Every evening   tadalafil (CIALIS) 20 mg, Oral, Daily PRN   testosterone cypionate (DEPOTESTOSTERONE CYPIONATE) 200 mg, Intramuscular, Every 14 days  tetrahydrozoline-zinc (VISINE-AC) 0.05-0.25 % ophthalmic solution 2 drops, Both Eyes, 3 times daily PRN    LKW: 2 PM tpa given?: No, on Eliquis IR Thrombectomy? No, exam not consistent with LVO Modified Rankin Scale: 0-Completely asymptomatic and back to baseline post- stroke Time of teleneurologist evaluation: 2:53 PM  Exam: Vitals:   11/28/20 1450  BP: (!) 143/66  Pulse: (!) 103  Resp: 18  SpO2: 97%    General: Comfortable, no acute distress Pulmonary: breathing  comfortably Cardiac: regular rate and rhythm on monitor   Finger tapping was bilaterally symmetric with excellent dexterity  NIH Stroke scale 1A: Level of Consciousness - 0 1B: Ask Month and Age - 0 1C: 'Blink Eyes' & 'Squeeze Hands' - 0 2: Test Horizontal Extraocular Movements - 0 3: Test Visual Fields - 0 4: Test Facial Palsy - 1 slight right facial droop per wife 5A: Test Left Arm Motor Drift - 0 5B: Test Right Arm Motor Drift - 0 6A: Test Left Leg Motor Drift - 1 6B: Test Right Leg Motor Drift - 1 7: Test Limb Ataxia - 0 8: Test Sensation - 1 subjective sense of sensation feeling to slightly different on the right side 9: Test Language/Aphasia- 0 10: Test Dysarthria - 0 11: Test Extinction/Inattention - 00 NIHSS score: 4   Imaging Reviewed:  Head CT personally reviewed, subtle asymmetric hypodensity in the anterior left subinsular white matter concerning for age-indeterminate lacunar infarct  Labs reviewed in epic and pertinent values follow: Glucose 159 Recent GFRs in the 30s to 40s  Assessment: 50 year old presenting with syncope followed by right-sided weakness which is fairly subtle and nondisabling.  Differential includes cardiogenic syncope with postevent recrudescence of chronic stroke, stroke/TIA, less likely seizure presenting with syncope followed by right-sided weakness which is fairly subtle and nondisabling.  Differential includes cardiogenic syncope with postevent recrudescence of chronic stroke, stroke/TIA, less likely seizure  Recommendations:  -MRI brain to confirm safety of continuing Eliquis, please hold until MRI completed and reviewed by neurology  -If Eliquis is not resumed tonight, would recommend aspirin 325 mg daily starting tomorrow morning until Eliquis can be resumed -MRA head and neck -Echocardiogram given syncope -Telemetry -Frequent neurochecks -A1c, lipid panel -No need for PT/OT as patient remains at functional baseline -Permissive hypertension to 220/110 overnight -Routine EEG -Stroke team to follow if patient requires transfer to [facility] for urgent MRI, Dr. Merlene Laughter to follow if MRI can be completed at [facility]  This patient  is receiving care for possible acute neurological changes. There was 32 minutes of care by this provider at the time of service, including time for direct evaluation via telemedicine, review of medical records, imaging studies and discussion of findings with providers, the patient and/or family.  Discussed with ED provider via phone  [place] [phone] If 8pm-8am, please page neurology on call as listed in [place].  CRITICAL CARE Performed by: Lorenza Chick   Total critical care time: 32 minutes  Critical care time was exclusive of separately billable procedures and treating other patients.  Critical care was necessary to treat or prevent imminent or life-threatening deterioration, with concern for acute neurological changes potentially requiring emergent intervention such as tPA/TNKase or thrombectomy.  Critical care was time spent personally by me on the following activities: development of treatment plan with patient and/or surrogate as well as nursing, discussions with consultants, evaluation of patient's response to treatment, examination of patient, obtaining history from patient or surrogate, ordering and performing treatments and interventions, ordering and review of laboratory studies, ordering and review of radiographic studies, pulse oximetry and re-evaluation of patient's condition.

## 2020-11-28 NOTE — Plan of Care (Signed)
MRI negative for stroke-can resume Eliquis   -- Amie Portland, MD Neurologist [facility] Pager: [phone]

## 2020-11-28 NOTE — ED Notes (Signed)
Beeped neuro to [phone].  Albert [name]

## 2020-11-28 NOTE — ED Provider Notes (Signed)
[facility] EMERGENCY DEPARTMENT Provider Note   CSN: [id number] Arrival date & time: 11/28/20  1436  An emergency department physician performed an initial assessment on this suspected stroke patient at 1448.  History Chief Complaint  Patient presents with   Code Stroke    Albert [name] is a 50 y.o. male with PMH DM, HTN who presents to the emergency department for evaluation of right-sided numbness.  He states that he was watching a movie with his family when he had a syncopal episode.  Upon awakening, patient had persistent right-sided numbness of the face, tongue heaviness, right upper extremity and left lower extremity numbness.  Denies chest pain, shortness of breath, abdominal pain, nausea, vomiting or other systemic symptoms.  HPI     Past Medical History:  Diagnosis Date   Atrial fibrillation ([place])    Diabetes mellitus without complication ([place])    GERD (gastroesophageal reflux disease)    Gout    Hypertension    Low testosterone    Sleep apnea     Patient Active Problem List   Diagnosis Date Noted   Paroxysmal atrial fibrillation ([place]) 01/06/2020   Secondary hypercoagulable state ([place]) 01/06/2020   Atrial fibrillation with rapid ventricular response ([place]) 12/26/2019   Essential hypertension 12/26/2019   ARF (acute renal failure) ([place]) 12/26/2019   Atrial fibrillation with RVR ([place]) 12/26/2019    Past Surgical History:  Procedure Laterality Date   CARDIAC CATHETERIZATION     COLONOSCOPY     COLONOSCOPY N/A 05/11/2015   Procedure: COLONOSCOPY;  Surgeon: Rogene Houston, MD;  [facility];  Service: Endoscopy;  Laterality: N/A;  930 - moved to 2/15 - Ann to notify   COLONOSCOPY WITH PROPOFOL N/A 09/07/2020   Procedure: COLONOSCOPY WITH PROPOFOL;  Surgeon: Rogene Houston, MD;  [facility];  Service: Endoscopy;  Laterality: N/A;  am   POLYPECTOMY  09/07/2020   Procedure: POLYPECTOMY;  Surgeon: Rogene Houston, MD;  [facility];  Service: Endoscopy;;       Family History  Problem Relation Age of Onset   Colon cancer Father     Social History   Tobacco Use   Smoking status: Never   Smokeless tobacco: Never  Vaping Use   Vaping Use: Never used  Substance Use Topics   Alcohol use: Not Currently    Comment: occasionally   Drug use: No    Comment: reports use of CBG gummies    Home Medications Prior to Admission medications   Medication Sig Start Date End Date Taking? Authorizing Provider  acetaminophen (TYLENOL) 650 MG CR tablet Take 650 mg by mouth every 8 (eight) hours as needed for pain.    [provider]  apixaban (ELIQUIS) 5 MG TABS tablet Take 1 tablet (5 mg total) by mouth 2 (two) times daily. 09/08/20   Rehman, Mechele Dawley, MD  Cholecalciferol (DIALYVITE VITAMIN D 5000) 125 MCG (5000 UT) capsule Take 5,000 Units by mouth daily.    [provider]  clobetasol cream (TEMOVATE) AB-123456789 % Apply 1 application topically 2 (two) times daily as needed (itching).    [provider]  colchicine 0.6 MG tablet Take 0.6 mg by mouth 2 (two) times daily as needed (gout flare up).  05/16/19   [provider]  diltiazem (CARDIZEM) 30 MG tablet Take 1 tablet every 4 hours AS NEEDED for AFIB heart rate >100 03/04/20   Fenton, Clint R, PA  dronedarone (MULTAQ) 400 MG tablet Take  1 tablet (400 mg total) by mouth 2 (two) times daily with a meal. 11/08/20   Fenton, Clint R, PA  febuxostat (ULORIC) 40 MG tablet Take 40 mg by mouth daily.    [provider]  fluticasone (FLONASE) 50 MCG/ACT nasal spray Place 1 spray into both nostrils daily as needed for allergies or rhinitis.    [provider]  Magnesium 200 MG TABS Take 800 mg by mouth daily. Taking 4 tablets by mouth daily- '800mg'$  total    [provider]  Melatonin 10 MG CAPS Take 10 mg by mouth at bedtime.    [provider]  meloxicam (MOBIC) 15 MG tablet Take 15 mg by mouth as needed for pain.   12/16/19   [provider]  metoprolol tartrate (LOPRESSOR) 25 MG tablet Take 1 tablet (25 mg total) by mouth 2 (two) times daily. 04/22/20   Fenton, Clint R, PA  pantoprazole (PROTONIX) 40 MG tablet Take 1 tablet (40 mg total) by mouth daily. 09/24/19   Long, Wonda Olds, MD  Potassium 99 MG TABS Take 99 mg by mouth every evening.    [provider]  tadalafil (CIALIS) 20 MG tablet Take 20 mg by mouth daily as needed for erectile dysfunction.    [provider]  testosterone cypionate (DEPOTESTOSTERONE CYPIONATE) 200 MG/ML injection Inject 200 mg into the muscle every 14 (fourteen) days.    [provider]  tetrahydrozoline-zinc (VISINE-AC) 0.05-0.25 % ophthalmic solution Place 2 drops into both eyes 3 (three) times daily as needed (dry eyes).    [provider]    Allergies    Penicillins and Levaquin [levofloxacin]  Review of Systems   Review of Systems  Constitutional:  Negative for chills and fever.  HENT:  Negative for ear pain and sore throat.   Eyes:  Negative for pain and visual disturbance.  Respiratory:  Negative for cough and shortness of breath.   Cardiovascular:  Negative for chest pain and palpitations.  Gastrointestinal:  Negative for abdominal pain and vomiting.  Genitourinary:  Negative for dysuria and hematuria.  Musculoskeletal:  Negative for arthralgias and back pain.  Skin:  Negative for color change and rash.  Neurological:  Positive for syncope and numbness. Negative for seizures.  All other systems reviewed and are negative.  Physical Exam Updated Vital Signs BP (!) 141/63   Pulse 99   Temp 99.4 F (37.4 C) (Oral)   Resp 19   SpO2 99%   Physical Exam Vitals and nursing note reviewed.  Constitutional:      Appearance: He is well-developed.  HENT:     Head: Normocephalic and atraumatic.  Eyes:     Conjunctiva/sclera: Conjunctivae normal.  Cardiovascular:     Rate and Rhythm: Normal rate and regular rhythm.      Heart sounds: No murmur heard. Pulmonary:     Effort: Pulmonary effort is normal. No respiratory distress.     Breath sounds: Normal breath sounds.  Abdominal:     Palpations: Abdomen is soft.     Tenderness: There is no abdominal tenderness.  Musculoskeletal:     Cervical back: Neck supple.  Skin:    General: Skin is warm and dry.  Neurological:     Mental Status: He is alert and oriented to person, place, and time.     Cranial Nerves: No cranial nerve deficit.     Sensory: Sensory deficit (subjective R facial numbness, RUE numbness) present.     Motor: No weakness.  ED Results / Procedures / Treatments   Labs (all labs ordered are listed, but only abnormal results are displayed) Labs Reviewed  CBC - Abnormal; Notable for the following components:      Result Value   Hemoglobin 18.3 (*)    HCT 52.3 (*)    All other components within normal limits  COMPREHENSIVE METABOLIC PANEL - Abnormal; Notable for the following components:   Glucose, Bld 145 (*)    BUN 26 (*)    Creatinine, Ser 1.75 (*)    Calcium 8.7 (*)    GFR, Estimated 47 (*)    All other components within normal limits  RAPID URINE DRUG SCREEN, HOSP PERFORMED - Abnormal; Notable for the following components:   Tetrahydrocannabinol POSITIVE (*)    All other components within normal limits  CBG MONITORING, ED - Abnormal; Notable for the following components:   Glucose-Capillary 159 (*)    All other components within normal limits  RESP PANEL BY RT-PCR (FLU A&B, COVID) ARPGX2  ETHANOL  PROTIME-INR  APTT  DIFFERENTIAL  URINALYSIS, ROUTINE W REFLEX MICROSCOPIC  I-STAT CHEM 8, ED    EKG None  Radiology CT HEAD CODE STROKE WO CONTRAST  Result Date: 11/28/2020 CLINICAL DATA:  Code stroke. Neuro deficit, acute, stroke suspected. Additional history provided: Syncopal episode 1 hour ago, patient awoke with right-sided numbness. EXAM: CT HEAD WITHOUT CONTRAST TECHNIQUE: Contiguous axial images were obtained from  the base of the skull through the vertex without intravenous contrast. COMPARISON:  No pertinent prior exams available for comparison. FINDINGS: Brain: Cerebral volume is normal. Subtle asymmetric hypodensity is questioned within the anterior left subinsular white matter which could reflect an age-indeterminate lacunar infarct (for instance as seen on series 4, image 31). There is no acute intracranial hemorrhage. No demarcated cortical infarct. No extra-axial fluid collection. No evidence of an intracranial mass. No midline shift. Vascular: No hyperdense vessel. Skull: Normal. Negative for fracture or focal lesion. Sinuses/Orbits: Visualized orbits show no acute finding. No significant paranasal sinus disease at the imaged levels. Other: Periapical lucency surrounding the right mandibular second and third molars, partially imaged. ASPECTS ([place] Stroke Program Early CT Score) - Ganglionic level infarction (caudate, lentiform nuclei, internal capsule, insula, M1-M3 cortex): 7 - Supraganglionic infarction (M4-M6 cortex): 3 Total score (0-10 with 10 being normal): 10 These results were called by telephone at the time of interpretation on 11/28/2020 at 3:03 pm to provider Dontray [facility] , who verbally acknowledged these results. IMPRESSION: Subtle asymmetric hypodensity questioned within the anterior left subinsular white matter, which could reflect an age-indeterminate lacunar infarct. Consider a brain MRI for further evaluation. No acute demarcated cortical infarct. No evidence of acute intracranial hemorrhage. Electronically Signed   By: Kellie Simmering D.O.   On: 11/28/2020 15:03    Procedures Procedures   Medications Ordered in ED Medications - No data to display  ED Course  I have reviewed the triage vital signs and the nursing notes.  Pertinent labs & imaging results that were available during my care of the patient were reviewed by me and considered in my medical decision making (see chart for  details).  Clinical Course as of 11/28/20 1611  Mon Nov 28, 2020  1502 Possible R lacunar [MK]    Clinical Course User Index [MK] Jatinder Mcdonagh, Debe Coder, MD   MDM Rules/Calculators/A&P                           Patient seen in the emergency  department for evaluation of syncope and right facial and upper extremity numbness.  Physical exam reveals subjective numbness to the right side but is otherwise unremarkable.  Laboratory evaluation reveals a creatinine elevation to 1.75, BUN elevation of 26 baseline for this patient, UDS positive for THC.  CT head concerning for possible age-indeterminate lacunar stroke.  Neurology consulted recommending MRI imaging.  MRI unavailable at [facility] and patient will be transferred to [facility] for MRI.  Final Clinical Impression(s) / ED Diagnoses Final diagnoses:  None    Rx / DC Orders ED Discharge Orders     None        Amarya Kuehl, Debe Coder, MD 11/28/20 [id number]

## 2020-11-28 NOTE — Progress Notes (Signed)
Pt. Has his home cpap. Pt. Placed on himself. No issues at this time.

## 2020-11-28 NOTE — ED Triage Notes (Signed)
Pt reports he was sitting down watching a movie with family and then all of a sudden he passed out. Pt reports it lasted about 20-30 seconds and when he woke up his right side of face, arm and leg felt numb. Pt reports his tongue feels heavy. Dr. Matilde Sprang called to bedside due to possible stroke.

## 2020-11-29 ENCOUNTER — Observation Stay (HOSPITAL_COMMUNITY): Payer: BC Managed Care – PPO

## 2020-11-29 ENCOUNTER — Observation Stay (HOSPITAL_BASED_OUTPATIENT_CLINIC_OR_DEPARTMENT_OTHER): Payer: BC Managed Care – PPO

## 2020-11-29 ENCOUNTER — Other Ambulatory Visit: Payer: Self-pay

## 2020-11-29 DIAGNOSIS — I10 Essential (primary) hypertension: Secondary | ICD-10-CM

## 2020-11-29 DIAGNOSIS — R200 Anesthesia of skin: Secondary | ICD-10-CM | POA: Diagnosis not present

## 2020-11-29 DIAGNOSIS — I639 Cerebral infarction, unspecified: Secondary | ICD-10-CM | POA: Diagnosis not present

## 2020-11-29 DIAGNOSIS — R55 Syncope and collapse: Secondary | ICD-10-CM | POA: Diagnosis not present

## 2020-11-29 DIAGNOSIS — I480 Paroxysmal atrial fibrillation: Secondary | ICD-10-CM | POA: Diagnosis not present

## 2020-11-29 LAB — LIPID PANEL
Cholesterol: 148 mg/dL (ref 0–200)
HDL: 35 mg/dL — ABNORMAL LOW (ref >40)
LDL Cholesterol: 100 mg/dL — ABNORMAL HIGH (ref 0–99)
Total CHOL/HDL Ratio: 4.2 RATIO
Triglycerides: 63 mg/dL (ref <150)
VLDL: 13 mg/dL (ref 0–40)

## 2020-11-29 LAB — ECHOCARDIOGRAM COMPLETE
AR max vel: 2.82 cm2
AV Area VTI: 2.87 cm2
AV Area mean vel: 2.71 cm2
AV Mean grad: 4 mmHg
AV Peak grad: 8.5 mmHg
Ao pk vel: 1.46 m/s
Area-P 1/2: 3.33 cm2
Height: 71 in
S' Lateral: 3.1 cm
Single Plane A4C EF: 64.8 %
Weight: 4800 oz

## 2020-11-29 LAB — HEMOGLOBIN A1C
Hgb A1c MFr Bld: 5.5 % (ref 4.8–5.6)
Mean Plasma Glucose: 111.15 mg/dL

## 2020-11-29 MED ORDER — ATORVASTATIN CALCIUM 40 MG PO TABS: 40.0000 mg | ORAL_TABLET | Freq: Every day | ORAL | 1 refills | Status: DC

## 2020-11-29 MED ORDER — PERFLUTREN LIPID MICROSPHERE
1.0000 mL | INTRAVENOUS | Status: AC | PRN
  Administered 2020-11-29: 2 mL via INTRAVENOUS
  Filled 2020-11-29: qty 10

## 2020-11-29 NOTE — Consult Note (Addendum)
Stroke Neurology Consultation Note  ATTENDING NOTE: I reviewed above note and agree with the assessment and plan. Pt was seen and examined.   51 year old male with history of A. fib on Eliquis, hypertension, diabetes, OSA on CPAP, morbid obesity, ocular migraine admitted for episode of briefly loss of consciousness followed by right facial numbness and right side heaviness feeling.  Patient stated that he was watching TV with his wife, had a sudden onset of loss of consciousness for 10 seconds, no prodromal symptoms.  Patient wife thought patient was just falling asleep.  Patient woke up feeling right face numbness, he stood up walked around feeling right-sided heaviness but no falling or difficulty walking.  He came to ER for evaluation.  Symptoms lasted about 30 minutes and resolved.  CT no acute abnormality but questionable left subinsular white matter lacunar infarct.  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  MRA head and neck negative.  EF 55 to 60%, EEG negative for seizure.  A1c 5.5, LDL 100.  UDS THC positive.  Creatinine 1.75.  Patient reported A. fib episode last Friday night for about 2 to 3 hours with heart palpitation.  Since then he has no palpitation.  Currently no A. fib on exam.  He also reported blurry vision for 10 seconds in 2008.  On exam, neurologically intact, no focal deficit.  Patient awake alert fully orientated.  Etiology for patient's symptoms not quite clear, TIA versus syncope with recrudescence of old stroke versus falling asleep briefly followed by anxiety.  Recommend to continue Eliquis 5 mg twice daily, add Lipitor 40 for LDL above goal.  Stroke risk factor modification.  Patient can be discharged from neuro standpoint.  He will follow-up with [facility] in 4 weeks.  For detailed assessment and plan, please refer to above as I have made changes wherever appropriate.   Neurology will sign off. Please call with  questions. Pt will follow up with stroke clinic NP at [facility] in about 4 weeks. Thanks for the consult.   Rosalin Hawking, MD PhD Stroke Neurology 11/29/2020 6:09 PM    Consult Requested by: Dr. Maureen Ralphs  Reason for Consult: Stroke like episode consisting of syncope followed by right sided weakness and numbness   Consult Date:  11/29/20 The history was obtained from the patient.  History of Present Illness:  Albert [name] is an 50 y.o. caucasion male with with past medical history significant for paroxysmal atrial fibrillation (on Eliquis, applewatch noted brief episode 9/4), hypertension, diabetes, obstructive sleep apnea on CPAP, obesity (BMI 43.35), ocular migraines (last one a few days ago)  who presented to [facility] after a sudden syncopal episode lasting a few seconds after which his only notable deficits were right sided weakness, impaired coordination and sensory deficit. These deficits resolved rapidly.  Patient reports efforts to improve his health and strok/cardiac risk factors. He has lost 80 lbs over past 3 years. Walking one hour 5 days per week. He has been able to come off metformin.    Per Telemedicine evaluation: Date last known well: 11/28/20 Time last known well: 2pm tPA Given: No: on Eliquis w/last dose 9/5 am MRS:  0 NIHSS:  4   Past Medical History:  Diagnosis Date   Atrial fibrillation ([place])    Diabetes mellitus without complication ([place])    GERD (gastroesophageal reflux disease)    Gout    Hypertension    Low testosterone    Sleep apnea  Past Surgical History:  Procedure Laterality Date   CARDIAC CATHETERIZATION     COLONOSCOPY     COLONOSCOPY N/A 05/11/2015   Procedure: COLONOSCOPY;  Surgeon: Rogene Houston, MD;  Location: [facility]O [facility];  Service: Endoscopy;  Laterality: N/A;  930 - moved to 2/15 - Ann to notify   COLONOSCOPY WITH PROPOFOL N/A 09/07/2020   Procedure: COLONOSCOPY WITH PROPOFOL;  Surgeon: Rogene Houston, MD;  Location: [facility]  ENDO SUITE;  Service: Endoscopy;  Laterality: N/A;  am   POLYPECTOMY  09/07/2020   Procedure: POLYPECTOMY;  Surgeon: Rogene Houston, MD;  Location: [facility] END[facility];  Service: Endoscopy;;   Family History  Problem Relation Age of Onset   Colon cancer Father     Social History:  reports that he has never smoked. He has never used smokeless tobacco. He reports that he does not currently use alcohol. He reports that he does not use drugs.  Review of Systems: A full ROS was attempted today and was  able to be performed.  Systems assessed include - Constitutional, Eyes, HENT, Respiratory, Cardiovascular, Gastrointestinal, Genitourinary, Integument/breast, Hematologic/lymphatic, Musculoskeletal, Neurological, Behavioral/Psych, Endocrine, Allergic/Immunologic - with pertinent responses as per HPI.  Allergies:  Allergies  Allergen Reactions   Penicillins Other (See Comments)    Unknown childhood   Levaquin [Levofloxacin] Palpitations     Medications: I have reviewed the patient's current medications.  Test Results: CBC:  Recent Labs  Lab 11/28/20 1514  WBC 8.7  NEUTROABS 5.6  HGB 18.3*  HCT 52.3*  MCV 92.2  PLT [id number]   Basic Metabolic Panel:  Recent Labs  Lab 11/28/20 1514  NA 137  K 4.0  CL 106  CO2 25  GLUCOSE 145*  BUN 26*  CREATININE 1.75*  CALCIUM 8.7*   Liver Function Tests: Recent Labs  Lab 11/28/20 1514  AST 34  ALT 36  ALKPHOS 42  BILITOT 0.4  PROT 7.6  ALBUMIN 4.5   No results for input(s): LIPASE, AMYLASE in the last 168 hours. No results for input(s): AMMONIA in the last 168 hours. Coagulation Studies:  Recent Labs    11/28/20 1514  LABPROT 14.3  INR 1.1   Cardiac Enzymes: No results for input(s): CKTOTAL, CKMB, CKMBINDEX, TROPONINI in the last 168 hours. BNP: Invalid input(s): POCBNP CBG:  Recent Labs  Lab 11/28/20 1446  GLUCAP 159*   Urinalysis:  Recent Labs  Lab 11/28/20 1451  COLORURINE YELLOW  LABSPEC 1.020  PHURINE 5.5  GLUCOSEU  NEGATIVE  HGBUR NEGATIVE  BILIRUBINUR NEGATIVE  KETONESUR NEGATIVE  PROTEINUR NEGATIVE  NITRITE NEGATIVE  LEUKOCYTESUR NEGATIVE   Microbiology:  Results for orders placed or performed during the hospital encounter of 11/28/20  Resp Panel by RT-PCR (Flu A&B, Covid) Nasopharyngeal Swab     Status: None   Collection Time: 11/28/20  3:24 PM   Specimen: Nasopharyngeal Swab; Nasopharyngeal(NP) swabs in vial transport medium  Result Value Ref Range Status   SARS Coronavirus 2 by RT PCR NEGATIVE NEGATIVE Final    Comment: (NOTE) SARS-CoV-2 target nucleic acids are NOT DETECTED.  The SARS-CoV-2 RNA is generally detectable in upper respiratory specimens during the acute phase of infection. The lowest concentration of SARS-CoV-2 viral copies this assay can detect is 138 copies/mL. A negative result does not preclude SARS-Cov-2 infection and should not be used as the sole basis for treatment or other patient management decisions. A negative result may occur with  improper specimen collection/handling, submission of specimen other than nasopharyngeal swab, presence of viral  mutation(s) within the areas targeted by this assay, and inadequate number of viral copies(<138 copies/mL). A negative result must be combined with clinical observations, patient history, and epidemiological information. The expected result is Negative.  Fact Sheet for Patients:  EntrepreneurPulse.com.au  Fact Sheet for Healthcare Providers:  IncredibleEmployment.be  This test is no t yet approved or cleared by the [place] FDA and  has been authorized for detection and/or diagnosis of SARS-CoV-2 by FDA under an Emergency Use Authorization (EUA). This EUA will remain  in effect (meaning this test can be used) for the duration of the COVID-19 declaration under Section 564(b)(1) of the Act, 21 U.S.C.section 360bbb-3(b)(1), unless the authorization is terminated  or revoked  sooner.       Influenza A by PCR NEGATIVE NEGATIVE Final   Influenza B by PCR NEGATIVE NEGATIVE Final    Comment: (NOTE) The Xpert Xpress SARS-CoV-2/FLU/RSV plus assay is intended as an aid in the diagnosis of influenza from Nasopharyngeal swab specimens and should not be used as a sole basis for treatment. Nasal washings and aspirates are unacceptable for Xpert Xpress SARS-CoV-2/FLU/RSV testing.  Fact Sheet for Patients: EntrepreneurPulse.com.au  Fact Sheet for Healthcare Providers: IncredibleEmployment.be  This test is not yet approved or cleared by the [place] FDA and has been authorized for detection and/or diagnosis of SARS-CoV-2 by FDA under an Emergency Use Authorization (EUA). This EUA will remain in effect (meaning this test can be used) for the duration of the COVID-19 declaration under Section 564(b)(1) of the Act, 21 U.S.C. section 360bbb-3(b)(1), unless the authorization is terminated or revoked.  Performed at [facility], [address], [place], [place] [address]    Lipid Panel:     Component Value Date/Time   CHOL 148 11/29/2020 0124   TRIG 63 11/29/2020 0124   HDL 35 (L) 11/29/2020 0124   CHOLHDL 4.2 11/29/2020 0124   VLDL 13 11/29/2020 0124   LDLCALC 100 (H) 11/29/2020 0124   HgbA1c:  Lab Results  Component Value Date   HGBA1C 5.5 11/29/2020   Urine Drug Screen:     Component Value Date/Time   LABOPIA NONE DETECTED 11/28/2020 1451   COCAINSCRNUR NONE DETECTED 11/28/2020 1451   LABBENZ NONE DETECTED 11/28/2020 1451   AMPHETMU NONE DETECTED 11/28/2020 1451   THCU POSITIVE (A) 11/28/2020 1451   LABBARB NONE DETECTED 11/28/2020 1451    Alcohol Level:  Recent Labs  Lab 11/28/20 1514  ETH <10    MR ANGIO HEAD WO CONTRAST  Result Date: 11/28/2020 CLINICAL DATA:  Initial evaluation for neuro deficit, stroke suspected. Right-sided numbness. EXAM: MRI HEAD WITHOUT CONTRAST MRA HEAD WITHOUT CONTRAST MRA  NECK WITHOUT AND WITH CONTRAST TECHNIQUE: Multiplanar, multi-echo pulse sequences of the brain and surrounding structures were acquired without intravenous contrast. Angiographic images of the Circle of Willis were acquired using MRA technique without intravenous contrast. Angiographic images of the neck were acquired using MRA technique without and with intravenous contrast. Carotid stenosis measurements (when applicable) are obtained utilizing NASCET criteria, using the distal internal carotid diameter as the denominator. CONTRAST:  55m GADAVIST GADOBUTROL 1 MMOL/ML IV SOLN COMPARISON:  Prior CT from earlier the same day. FINDINGS: MRI HEAD FINDINGS Brain: Cerebral volume within normal limits for age. Small remote lacunar infarct noted involving the anterior left external capsule/subinsular white matter, corresponding with hypodensity seen on prior CT. Additional probable punctate remote lacunar infarct noted at the right frontal corona radiata (series 11, image 15). Underlying minimal chronic microvascular ischemic disease noted. No abnormal foci  of restricted diffusion to suggest acute or subacute ischemia. Gray-white matter differentiation maintained. No encephalomalacia to suggest chronic cortical infarction. No foci of susceptibility artifact to suggest acute or chronic intracranial hemorrhage. 3 mm nodular density noted along the pituitary stalk (series 9, image 14), indeterminate. Pituitary gland and suprasellar region otherwise unremarkable. Midline structures intact. No other mass lesion, mass effect, or midline shift. No hydrocephalus or extra-axial fluid collection. Vascular: Major intracranial vascular flow voids are maintained. Skull and upper cervical spine: Craniocervical junction within normal limits. Bone marrow signal intensity somewhat diffusely decreased on T1 weighted imaging, nonspecific, but most commonly related to anemia, smoking, or obesity. No focal marrow replacing lesion. No scalp  soft tissue abnormality. Sinuses/Orbits: Globes and orbital soft tissues within normal limits. Paranasal sinuses are clear. No mastoid effusion. Inner ear structures grossly normal. Other: None. MRA HEAD FINDINGS Anterior circulation: Visualized distal cervical segments of the internal carotid arteries are widely patent with antegrade flow. Petrous, cavernous, and supraclinoid segments widely patent without stenosis or other abnormality. A1 segments widely patent. Normal anterior communicating artery complex. Anterior cerebral arteries patent to their distal aspects without stenosis. No M1 stenosis or occlusion. Normal MCA bifurcations. Distal MCA branches well perfused and symmetric. Posterior circulation: Visualized distal V4 segments widely patent bilaterally. Left vertebral artery dominant. Neither PICA origin visualized. Basilar diminutive but widely patent to its distal aspect. Superior cerebellar arteries patent bilaterally. Both PCA supplied via hypoplastic P1 segments and robust bilateral posterior communicating arteries. PCAs well perfused to their distal aspects without stenosis. Anatomic variants: Predominant fetal type origin of the PCAs with overall diminutive vertebrobasilar system. No intracranial aneurysm. MRA NECK FINDINGS Aortic arch: Visualized aortic arch normal in caliber with normal 3 vessel morphology. No stenosis or other abnormality about the origin of the great vessels. Right carotid system: Right common and internal carotid arteries widely patent without stenosis, evidence for dissection or occlusion. Left carotid system: Left common and internal carotid arteries widely patent without stenosis, evidence for dissection or occlusion. Vertebral arteries: Both vertebral arteries arise from the subclavian arteries. No proximal subclavian artery stenosis. Right vertebral artery dominant. Both vertebral arteries widely patent without stenosis, evidence for dissection or occlusion. Other: None  IMPRESSION: MRI HEAD: 1. No acute intracranial infarct or other abnormality. 2. Small remote lacunar infarcts involving the anterior left external capsule/subinsular white matter and right frontal corona radiata. 3. 3 mm nodular density along the pituitary stalk, indeterminate. Correlation with pituitary function tests suggested. Additionally, follow-up examination with dedicated pituitary protocol MRI, with and without contrast, could be performed for further evaluation as warranted. 4. Otherwise normal brain MRI for age. MRA HEAD: 1. Normal intracranial MRA. No large vessel occlusion, hemodynamically significant stenosis, or other acute vascular abnormality. 2. Predominant fetal type origin of the PCAs with overall diminutive vertebrobasilar system. MRA NECK: Normal MRA of the neck with wide patency of both carotid artery systems and vertebral arteries. Electronically Signed   By: Jeannine Boga M.D.   On: 11/28/2020 21:55   MR ANGIO NECK W WO CONTRAST  Result Date: 11/28/2020 CLINICAL DATA:  Initial evaluation for neuro deficit, stroke suspected. Right-sided numbness. EXAM: MRI HEAD WITHOUT CONTRAST MRA HEAD WITHOUT CONTRAST MRA NECK WITHOUT AND WITH CONTRAST TECHNIQUE: Multiplanar, multi-echo pulse sequences of the brain and surrounding structures were acquired without intravenous contrast. Angiographic images of the Circle of Willis were acquired using MRA technique without intravenous contrast. Angiographic images of the neck were acquired using MRA technique without and with intravenous contrast. Carotid stenosis  measurements (when applicable) are obtained utilizing NASCET criteria, using the distal internal carotid diameter as the denominator. CONTRAST:  7m GADAVIST GADOBUTROL 1 MMOL/ML IV SOLN COMPARISON:  Prior CT from earlier the same day. FINDINGS: MRI HEAD FINDINGS Brain: Cerebral volume within normal limits for age. Small remote lacunar infarct noted involving the anterior left external  capsule/subinsular white matter, corresponding with hypodensity seen on prior CT. Additional probable punctate remote lacunar infarct noted at the right frontal corona radiata (series 11, image 15). Underlying minimal chronic microvascular ischemic disease noted. No abnormal foci of restricted diffusion to suggest acute or subacute ischemia. Gray-white matter differentiation maintained. No encephalomalacia to suggest chronic cortical infarction. No foci of susceptibility artifact to suggest acute or chronic intracranial hemorrhage. 3 mm nodular density noted along the pituitary stalk (series 9, image 14), indeterminate. Pituitary gland and suprasellar region otherwise unremarkable. Midline structures intact. No other mass lesion, mass effect, or midline shift. No hydrocephalus or extra-axial fluid collection. Vascular: Major intracranial vascular flow voids are maintained. Skull and upper cervical spine: Craniocervical junction within normal limits. Bone marrow signal intensity somewhat diffusely decreased on T1 weighted imaging, nonspecific, but most commonly related to anemia, smoking, or obesity. No focal marrow replacing lesion. No scalp soft tissue abnormality. Sinuses/Orbits: Globes and orbital soft tissues within normal limits. Paranasal sinuses are clear. No mastoid effusion. Inner ear structures grossly normal. Other: None. MRA HEAD FINDINGS Anterior circulation: Visualized distal cervical segments of the internal carotid arteries are widely patent with antegrade flow. Petrous, cavernous, and supraclinoid segments widely patent without stenosis or other abnormality. A1 segments widely patent. Normal anterior communicating artery complex. Anterior cerebral arteries patent to their distal aspects without stenosis. No M1 stenosis or occlusion. Normal MCA bifurcations. Distal MCA branches well perfused and symmetric. Posterior circulation: Visualized distal V4 segments widely patent bilaterally. Left vertebral  artery dominant. Neither PICA origin visualized. Basilar diminutive but widely patent to its distal aspect. Superior cerebellar arteries patent bilaterally. Both PCA supplied via hypoplastic P1 segments and robust bilateral posterior communicating arteries. PCAs well perfused to their distal aspects without stenosis. Anatomic variants: Predominant fetal type origin of the PCAs with overall diminutive vertebrobasilar system. No intracranial aneurysm. MRA NECK FINDINGS Aortic arch: Visualized aortic arch normal in caliber with normal 3 vessel morphology. No stenosis or other abnormality about the origin of the great vessels. Right carotid system: Right common and internal carotid arteries widely patent without stenosis, evidence for dissection or occlusion. Left carotid system: Left common and internal carotid arteries widely patent without stenosis, evidence for dissection or occlusion. Vertebral arteries: Both vertebral arteries arise from the subclavian arteries. No proximal subclavian artery stenosis. Right vertebral artery dominant. Both vertebral arteries widely patent without stenosis, evidence for dissection or occlusion. Other: None IMPRESSION: MRI HEAD: 1. No acute intracranial infarct or other abnormality. 2. Small remote lacunar infarcts involving the anterior left external capsule/subinsular white matter and right frontal corona radiata. 3. 3 mm nodular density along the pituitary stalk, indeterminate. Correlation with pituitary function tests suggested. Additionally, follow-up examination with dedicated pituitary protocol MRI, with and without contrast, could be performed for further evaluation as warranted. 4. Otherwise normal brain MRI for age. MRA HEAD: 1. Normal intracranial MRA. No large vessel occlusion, hemodynamically significant stenosis, or other acute vascular abnormality. 2. Predominant fetal type origin of the PCAs with overall diminutive vertebrobasilar system. MRA NECK: Normal MRA of the  neck with wide patency of both carotid artery systems and vertebral arteries. Electronically Signed   By: BMarland Kitchen  Jeannine Boga M.D.   On: 11/28/2020 21:55   MR BRAIN WO CONTRAST  Result Date: 11/28/2020 CLINICAL DATA:  Initial evaluation for neuro deficit, stroke suspected. Right-sided numbness. EXAM: MRI HEAD WITHOUT CONTRAST MRA HEAD WITHOUT CONTRAST MRA NECK WITHOUT AND WITH CONTRAST TECHNIQUE: Multiplanar, multi-echo pulse sequences of the brain and surrounding structures were acquired without intravenous contrast. Angiographic images of the Circle of Willis were acquired using MRA technique without intravenous contrast. Angiographic images of the neck were acquired using MRA technique without and with intravenous contrast. Carotid stenosis measurements (when applicable) are obtained utilizing NASCET criteria, using the distal internal carotid diameter as the denominator. CONTRAST:  60m GADAVIST GADOBUTROL 1 MMOL/ML IV SOLN COMPARISON:  Prior CT from earlier the same day. FINDINGS: MRI HEAD FINDINGS Brain: Cerebral volume within normal limits for age. Small remote lacunar infarct noted involving the anterior left external capsule/subinsular white matter, corresponding with hypodensity seen on prior CT. Additional probable punctate remote lacunar infarct noted at the right frontal corona radiata (series 11, image 15). Underlying minimal chronic microvascular ischemic disease noted. No abnormal foci of restricted diffusion to suggest acute or subacute ischemia. Gray-white matter differentiation maintained. No encephalomalacia to suggest chronic cortical infarction. No foci of susceptibility artifact to suggest acute or chronic intracranial hemorrhage. 3 mm nodular density noted along the pituitary stalk (series 9, image 14), indeterminate. Pituitary gland and suprasellar region otherwise unremarkable. Midline structures intact. No other mass lesion, mass effect, or midline shift. No hydrocephalus or  extra-axial fluid collection. Vascular: Major intracranial vascular flow voids are maintained. Skull and upper cervical spine: Craniocervical junction within normal limits. Bone marrow signal intensity somewhat diffusely decreased on T1 weighted imaging, nonspecific, but most commonly related to anemia, smoking, or obesity. No focal marrow replacing lesion. No scalp soft tissue abnormality. Sinuses/Orbits: Globes and orbital soft tissues within normal limits. Paranasal sinuses are clear. No mastoid effusion. Inner ear structures grossly normal. Other: None. MRA HEAD FINDINGS Anterior circulation: Visualized distal cervical segments of the internal carotid arteries are widely patent with antegrade flow. Petrous, cavernous, and supraclinoid segments widely patent without stenosis or other abnormality. A1 segments widely patent. Normal anterior communicating artery complex. Anterior cerebral arteries patent to their distal aspects without stenosis. No M1 stenosis or occlusion. Normal MCA bifurcations. Distal MCA branches well perfused and symmetric. Posterior circulation: Visualized distal V4 segments widely patent bilaterally. Left vertebral artery dominant. Neither PICA origin visualized. Basilar diminutive but widely patent to its distal aspect. Superior cerebellar arteries patent bilaterally. Both PCA supplied via hypoplastic P1 segments and robust bilateral posterior communicating arteries. PCAs well perfused to their distal aspects without stenosis. Anatomic variants: Predominant fetal type origin of the PCAs with overall diminutive vertebrobasilar system. No intracranial aneurysm. MRA NECK FINDINGS Aortic arch: Visualized aortic arch normal in caliber with normal 3 vessel morphology. No stenosis or other abnormality about the origin of the great vessels. Right carotid system: Right common and internal carotid arteries widely patent without stenosis, evidence for dissection or occlusion. Left carotid system: Left  common and internal carotid arteries widely patent without stenosis, evidence for dissection or occlusion. Vertebral arteries: Both vertebral arteries arise from the subclavian arteries. No proximal subclavian artery stenosis. Right vertebral artery dominant. Both vertebral arteries widely patent without stenosis, evidence for dissection or occlusion. Other: None IMPRESSION: MRI HEAD: 1. No acute intracranial infarct or other abnormality. 2. Small remote lacunar infarcts involving the anterior left external capsule/subinsular white matter and right frontal corona radiata. 3. 3 mm nodular density along the  pituitary stalk, indeterminate. Correlation with pituitary function tests suggested. Additionally, follow-up examination with dedicated pituitary protocol MRI, with and without contrast, could be performed for further evaluation as warranted. 4. Otherwise normal brain MRI for age. MRA HEAD: 1. Normal intracranial MRA. No large vessel occlusion, hemodynamically significant stenosis, or other acute vascular abnormality. 2. Predominant fetal type origin of the PCAs with overall diminutive vertebrobasilar system. MRA NECK: Normal MRA of the neck with wide patency of both carotid artery systems and vertebral arteries. Electronically Signed   By: Jeannine Boga M.D.   On: 11/28/2020 21:55   EEG adult  Result Date: 11/29/2020 Lora Havens, MD     11/29/2020 10:17 AM Patient Name: Albert [name] MRN: [id number] Epilepsy Attending: Lora Havens Referring Physician/Provider: Dr Jenetta Downer Date: 11/29/2020 Duration: 22.07 mins Patient history: 50 year old presenting with syncope followed by right-sided weakness. EEG to evaluate for seizures. Level of alertness: Awake AEDs during EEG study: None Technical aspects: This EEG study was done with scalp electrodes positioned according to the 10-20 International system of electrode placement. Electrical activity was acquired at a sampling rate of '500Hz'$   and reviewed with a high frequency filter of '70Hz'$  and a low frequency filter of '1Hz'$ . EEG data were recorded continuously and digitally stored. Description: The posterior dominant rhythm consists of '9Hz'$  activity of moderate voltage (25-35 uV) seen predominantly in posterior head regions, symmetric and reactive to eye opening and eye closing. Hyperventilation and photic stimulation were not performed.   IMPRESSION: This study is within normal limits. No seizures or epileptiform discharges were seen throughout the recording. Lora Havens   ECHOCARDIOGRAM COMPLETE  Result Date: 11/29/2020    ECHOCARDIOGRAM REPORT   Patient Name:   Albert [name] Polhemus Date of Exam: 11/29/2020 Medical Rec #:  [id number]            Height:       71.0 in Accession #:    [id number]           Weight:       310.8 lb Date of Birth:  [birth year]             BSA:          2.543 m Patient Age:    32 years             BP:           119/59 mmHg Patient Gender: M                    HR:           63 bpm. Exam Location:  Inpatient Procedure: 2D Echo, Cardiac Doppler and Color Doppler Indications:    CVA  History:        Patient has prior history of Echocardiogram examinations, most                 recent 12/26/2019. Arrythmias:Atrial Fibrillation; Risk                 Factors:Hypertension.  Sonographer:    MH Referring Phys: Baker  1. Left ventricular ejection fraction, by estimation, is 55 to 60%. The left ventricle has normal function. The left ventricle has no regional wall motion abnormalities. Left ventricular diastolic parameters were normal.  2. Right ventricular systolic function was not well visualized. The right ventricular size is not well visualized.  3. The mitral valve is grossly normal. Trivial mitral valve regurgitation.  4. The  aortic valve is grossly normal. Aortic valve regurgitation is not visualized. No aortic stenosis is present. FINDINGS  Left Ventricle: Left ventricular ejection fraction, by  estimation, is 55 to 60%. The left ventricle has normal function. The left ventricle has no regional wall motion abnormalities. The left ventricular internal cavity size was normal in size. There is  no left ventricular hypertrophy. Left ventricular diastolic parameters were normal. Right Ventricle: The right ventricular size is not well visualized. Right vetricular wall thickness was not well visualized. Right ventricular systolic function was not well visualized. Left Atrium: Left atrial size was normal in size. Right Atrium: Right atrial size was normal in size. Pericardium: There is no evidence of pericardial effusion. Mitral Valve: The mitral valve is grossly normal. Trivial mitral valve regurgitation. Tricuspid Valve: The tricuspid valve is not well visualized. Tricuspid valve regurgitation is trivial. Aortic Valve: The aortic valve is grossly normal. Aortic valve regurgitation is not visualized. No aortic stenosis is present. Aortic valve mean gradient measures 4.0 mmHg. Aortic valve peak gradient measures 8.5 mmHg. Aortic valve area, by VTI measures 2.87 cm. Pulmonic Valve: The pulmonic valve was not well visualized. Pulmonic valve regurgitation is not visualized. Aorta: The aortic root and ascending aorta are structurally normal, with no evidence of dilitation. IAS/Shunts: The atrial septum is grossly normal.  LEFT VENTRICLE PLAX 2D LVIDd:         4.90 cm     Diastology LVIDs:         3.10 cm     LV e' medial:    9.90 cm/s LV PW:         0.80 cm     LV E/e' medial:  8.6 LV IVS:        1.90 cm     LV e' lateral:   7.94 cm/s LVOT diam:     2.30 cm     LV E/e' lateral: 10.7 LV SV:         94 LV SV Index:   37 LVOT Area:     4.15 cm  LV Volumes (MOD) LV vol d, MOD A4C: 54.9 ml LV vol s, MOD A4C: 19.3 ml LV SV MOD A4C:     54.9 ml RIGHT VENTRICLE TAPSE (M-mode): 3.4 cm LEFT ATRIUM           Index LA diam:      2.70 cm 1.06 cm/m LA Vol (A4C): 58.9 ml 23.16 ml/m  AORTIC VALVE                   PULMONIC VALVE  AV Area (Vmax):    2.82 cm    PV Vmax:       0.66 m/s AV Area (Vmean):   2.71 cm    PV Peak grad:  1.7 mmHg AV Area (VTI):     2.87 cm AV Vmax:           146.00 cm/s AV Vmean:          97.500 cm/s AV VTI:            0.327 m AV Peak Grad:      8.5 mmHg AV Mean Grad:      4.0 mmHg LVOT Vmax:         99.20 cm/s LVOT Vmean:        63.700 cm/s LVOT VTI:          0.226 m LVOT/AV VTI ratio: 0.69  AORTA Ao Root diam: 2.70 cm Ao Asc diam:  3.00 cm MITRAL VALVE MV Area (PHT): 3.33 cm    SHUNTS MV E velocity: 84.80 cm/s  Systemic VTI:  0.23 m MV A velocity: 63.40 cm/s  Systemic Diam: 2.30 cm MV E/A ratio:  1.34 Mertie Moores MD Electronically signed by Mertie Moores MD Signature Date/Time: 11/29/2020/3:37:03 PM    Final    CT HEAD CODE STROKE WO CONTRAST  Result Date: 11/28/2020 CLINICAL DATA:  Code stroke. Neuro deficit, acute, stroke suspected. Additional history provided: Syncopal episode 1 hour ago, patient awoke with right-sided numbness. EXAM: CT HEAD WITHOUT CONTRAST TECHNIQUE: Contiguous axial images were obtained from the base of the skull through the vertex without intravenous contrast. COMPARISON:  No pertinent prior exams available for comparison. FINDINGS: Brain: Cerebral volume is normal. Subtle asymmetric hypodensity is questioned within the anterior left subinsular white matter which could reflect an age-indeterminate lacunar infarct (for instance as seen on series 4, image 31). There is no acute intracranial hemorrhage. No demarcated cortical infarct. No extra-axial fluid collection. No evidence of an intracranial mass. No midline shift. Vascular: No hyperdense vessel. Skull: Normal. Negative for fracture or focal lesion. Sinuses/Orbits: Visualized orbits show no acute finding. No significant paranasal sinus disease at the imaged levels. Other: Periapical lucency surrounding the right mandibular second and third molars, partially imaged. ASPECTS ([place] Stroke Program Early CT Score) - Ganglionic level  infarction (caudate, lentiform nuclei, internal capsule, insula, M1-M3 cortex): 7 - Supraganglionic infarction (M4-M6 cortex): 3 Total score (0-10 with 10 being normal): 10 These results were called by telephone at the time of interpretation on 11/28/2020 at 3:03 pm to provider [facility] , who verbally acknowledged these results. IMPRESSION: Subtle asymmetric hypodensity questioned within the anterior left subinsular white matter, which could reflect an age-indeterminate lacunar infarct. Consider a brain MRI for further evaluation. No acute demarcated cortical infarct. No evidence of acute intracranial hemorrhage. Electronically Signed   By: Kellie Simmering D.O.   On: 11/28/2020 15:03     EKG: NSR  Physical Examination: Temp:  [97.9 F (36.6 C)-99.7 F (37.6 C)] 98.7 F (37.1 C) (09/06 1121) Pulse Rate:  [60-101] 65 (09/06 1121) Resp:  [16-23] 16 (09/06 1121) BP: (106-137)/(58-87) 122/87 (09/06 1121) SpO2:  [94 %-100 %] 98 % (09/06 1121) Weight:  [136.1 kg] 136.1 kg (09/06 0918)  General - Obese male ,well developed, in no apparent distress sitting up independently on side of bed.  Ophthalmologic - fundi not visualized due to noncooperation.  Cardiovascular - Regular rate and rhythm.  Mental Status -  Level of arousal and orientation to time, place, and person were intact.  Language including expression, naming, repetition, comprehension was assessed and found intact. Attention span and concentration were normal. Recent and remote memory were intact. Fund of Knowledge was assessed and was intact.  Cranial Nerves II - XII - II - Visual field intact OU. III, IV, VI - Extraocular movements intact. V - Facial sensation intact bilaterally. VII - Facial movement intact bilaterally. VIII - Hearing & vestibular intact bilaterally. X - Palate elevates symmetrically. XI - Chin turning & shoulder shrug intact bilaterally. XII - Tongue protrusion intact.  Motor Strength - The patient's  strength was normal in all extremities and pronator drift was absent.  Bulk was normal and fasciculations were absent.   Motor Tone - Muscle tone was assessed at the neck and appendages and was normal.  Reflexes - The patient's reflexes were symmetrical in all extremities and he had no pathological reflexes.  Sensory - Light touch, temperature/pinprick  were assessed and were symmetrical.    Coordination - The patient had normal movements in the hands and feet with no ataxia or dysmetria.  Tremor was absent.  Gait and Station - deferred.   Assessment:    Albert [name] is an 51 y.o. caucasion male with with past medical history significant for paroxysmal atrial fibrillation (on Eliquis, applewatch noted brief episode 9/4), hypertension, diabetes, obstructive sleep apnea on CPAP, obesity (BMI 43.35), ocular migraines (last one a few days ago)  who presented to [facility] after a sudden syncopal episode lasting a few seconds after which his only notable deficits were right sided weakness, impaired coordination and sensory deficit. These deficits resolved rapidly.  TIA like episode consisting of brief syncope followed by right sided weakness, impaired coordination and sensory deficit  CT head  Subtle asymmetric hypodensity questioned within the anterior left subinsular white matter, which could reflect an age-indeterminate lacunar infarct.  Code Stroke CT head  Subtle asymmetric hypodensity questioned within the anterior left subinsular white matter, which could reflect an age-indeterminate lacunar infarct.  MRI   1. No acute intracranial infarct or other abnormality. 2. Small remote lacunar infarcts involving the anterior left external capsule/subinsular white matter and right frontal corona radiata. 3. 3 mm nodular density along the pituitary stalk, indeterminate. Correlation with pituitary function tests suggested.  MRA Normal intracranial MRA. No large vessel  occlusion, hemodynamically significant stenosis, or other acute vascular abnormality. 2. Predominant fetal type origin of the PCAs with overall diminutive vertebrobasilar system 2D Echo  EF 55-60%, No thrombus, wall motion abnormality or shunt found.   EEG: no seizure activity  LDL 100 HgbA1c 5.5  VTE prophylaxis: discharging home Diet Order             Diet Heart Room service appropriate? Yes; Fluid consistency: Thin  Diet effective now                   On Eliquis prior to admission Continue Eliquis  Therapy recommendations:  Home  Disposition:  Home   Paroxysmal Atrial Fibrillation On Eliquis, reports compliance Continue Eliquis  Home regimen Cardizem prn HR >100  Hypertension Stable Management per primary team  BP goal normotensive  Hyperlipidemia Home meds:  None  LDL 100, not at goal < 70 '40mg'$  Lipitor, high intensity dose added Continue statin at discharge  Diabetes type II, Controlled HgbA1c 5.5, goal < 7.0 Management per primary team  Other Stroke Risk Factors Obesity, Body mass index is 41.84 kg/m., recommend weight loss, diet and exercise as appropriate (recently doing very well on this front overall).  Hx stroke, old stroke found on workup Migraines Obstructive sleep apnea  Other Active Problems   Hospital day # 0   Thank you for this consultation and allowing [place] to participate in the care of this patient.  This plan of care was directed by Dr. Doristine Devoid, NP-C  To contact Stroke Continuity provider, please refer to [email or web address] After hours, contact General Neurology

## 2020-11-29 NOTE — Discharge Summary (Addendum)
Physician Discharge Summary  Albert [name] [id number] DOB: 06/06/1970 DOA: 11/28/2020  PCP: Monico Blitz, MD  Admit date: 11/28/2020 Discharge date: 11/29/2020  Admitted From: home Disposition:  home  Recommendations for Outpatient Follow-up:  Follow up with PCP in 1-2 weeks  Home Health: none Equipment/Devices: none  Discharge Condition: stable CODE STATUS: Full code Diet recommendation: heart healthy  HPI: Per admitting MD, Albert [name] is a 50 y.o. male with medical history significant for paroxysmal atrial fibrillation, hypertension. Patient presented to ED with complaints of passing out and right-sided numbness.  Patient reports that he was sitting down watching TV with his wife, when he believes he suddenly passed out probably for about 10 seconds and then came to and said "what just happened".  His wife was unaware of what had happened to him until when he said that. When he came to he noticed that the right side of his face was quite numb, to a lesser extent his extremities, but he also noticed a heaviness involving the right side of his body, requiring more effort to move his right side. At the time of my evaluation, numbness and heaviness have significantly improved. No facial asymmetry was noted, no slurred speech.  No prior strokes. Patient is on Eliquis and compliant, he took his medication this morning. Reports an episode of palpitations/atrial fibrillation caught on his wristwatch, heart rate of 120s to 130s, that self terminated after taking his PRN diltiazem.  Hospital Course / Discharge diagnoses: Principal problem Right-sided numbness and weakness / TIA -symptoms improving at this time.  Exam unremarkable at this time.  Head CT suggests lacunar infarcts age-indeterminate.  He underwent an MRI of the brain which was negative for acute CVAs.  His Eliquis has been resumed.  Lipid panel shows an elevated LDL of 100 and he was started on statin.  Hemoglobin A1c  was 5.5.  An EEG was done and did not show any epileptiform discharges.  A 2D echo was done and showed normal EF without any WMA. He recovered to baseline and will be discharged home in stable condition.    Active problems Hypertension-resume home medications on discharge Paroxysmal atrial fibrillation-currently in sinus rhythm.  Resume home  CKD 3b-Cr at baseline, ~1.7 Obesity, class III-patient's BMI is 41, he already lost 80 pounds and actively working for additional weight loss  Sepsis ruled out   Discharge Instructions   Allergies as of 11/29/2020       Reactions   Penicillins Other (See Comments)   Unknown childhood   Levaquin [levofloxacin] Palpitations        Medication List     TAKE these medications    acetaminophen 650 MG CR tablet Commonly known as: TYLENOL Take 650 mg by mouth every 8 (eight) hours as needed for pain.   atorvastatin 40 MG tablet Commonly known as: LIPITOR Take 1 tablet (40 mg total) by mouth daily. Start taking on: November 30, 2020   clobetasol cream 0.05 % Commonly known as: TEMOVATE Apply 1 application topically 2 (two) times daily as needed (itching).   colchicine 0.6 MG tablet Take 0.6 mg by mouth 2 (two) times daily as needed (gout flare up).   Dialyvite Vitamin D 5000 125 MCG (5000 UT) capsule Generic drug: Cholecalciferol Take 5,000 Units by mouth daily.   diltiazem 30 MG tablet Commonly known as: Cardizem Take 1 tablet every 4 hours AS NEEDED for AFIB heart rate >100   Eliquis 5 MG Tabs tablet Generic drug: apixaban Take  1 tablet (5 mg total) by mouth 2 (two) times daily.   febuxostat 40 MG tablet Commonly known as: ULORIC Take 40 mg by mouth daily.   fluticasone 50 MCG/ACT nasal spray Commonly known as: FLONASE Place 1 spray into both nostrils daily as needed for allergies or rhinitis.   Magnesium 200 MG Tabs Take 800 mg by mouth daily. Taking 4 tablets by mouth daily- '800mg'$  total   Melatonin 10 MG Caps Take 10  mg by mouth at bedtime.   meloxicam 15 MG tablet Commonly known as: MOBIC Take 15 mg by mouth as needed for pain.   metoprolol tartrate 25 MG tablet Commonly known as: LOPRESSOR Take 1 tablet (25 mg total) by mouth 2 (two) times daily.   Multaq 400 MG tablet Generic drug: dronedarone Take 1 tablet (400 mg total) by mouth 2 (two) times daily with a meal.   pantoprazole 40 MG tablet Commonly known as: PROTONIX Take 1 tablet (40 mg total) by mouth daily.   Potassium 99 MG Tabs Take 99 mg by mouth every evening.   tadalafil 20 MG tablet Commonly known as: CIALIS Take 20 mg by mouth daily as needed for erectile dysfunction.   testosterone cypionate 200 MG/ML injection Commonly known as: DEPOTESTOSTERONE CYPIONATE Inject 200 mg into the muscle every 14 (fourteen) days.   tetrahydrozoline-zinc 0.05-0.25 % ophthalmic solution Commonly known as: VISINE-AC Place 2 drops into both eyes 3 (three) times daily as needed (dry eyes).   vitamin B-12 100 MCG tablet Commonly known as: CYANOCOBALAMIN Take 100 mcg by mouth daily.       Consultations: Neurology   Procedures/Studies:  MR ANGIO HEAD WO CONTRAST  Result Date: 11/28/2020 CLINICAL DATA:  Initial evaluation for neuro deficit, stroke suspected. Right-sided numbness. EXAM: MRI HEAD WITHOUT CONTRAST MRA HEAD WITHOUT CONTRAST MRA NECK WITHOUT AND WITH CONTRAST TECHNIQUE: Multiplanar, multi-echo pulse sequences of the brain and surrounding structures were acquired without intravenous contrast. Angiographic images of the Circle of Willis were acquired using MRA technique without intravenous contrast. Angiographic images of the neck were acquired using MRA technique without and with intravenous contrast. Carotid stenosis measurements (when applicable) are obtained utilizing NASCET criteria, using the distal internal carotid diameter as the denominator. CONTRAST:  18m GADAVIST GADOBUTROL 1 MMOL/ML IV SOLN COMPARISON:  Prior CT from  earlier the same day. FINDINGS: MRI HEAD FINDINGS Brain: Cerebral volume within normal limits for age. Small remote lacunar infarct noted involving the anterior left external capsule/subinsular white matter, corresponding with hypodensity seen on prior CT. Additional probable punctate remote lacunar infarct noted at the right frontal corona radiata (series 11, image 15). Underlying minimal chronic microvascular ischemic disease noted. No abnormal foci of restricted diffusion to suggest acute or subacute ischemia. Gray-white matter differentiation maintained. No encephalomalacia to suggest chronic cortical infarction. No foci of susceptibility artifact to suggest acute or chronic intracranial hemorrhage. 3 mm nodular density noted along the pituitary stalk (series 9, image 14), indeterminate. Pituitary gland and suprasellar region otherwise unremarkable. Midline structures intact. No other mass lesion, mass effect, or midline shift. No hydrocephalus or extra-axial fluid collection. Vascular: Major intracranial vascular flow voids are maintained. Skull and upper cervical spine: Craniocervical junction within normal limits. Bone marrow signal intensity somewhat diffusely decreased on T1 weighted imaging, nonspecific, but most commonly related to anemia, smoking, or obesity. No focal marrow replacing lesion. No scalp soft tissue abnormality. Sinuses/Orbits: Globes and orbital soft tissues within normal limits. Paranasal sinuses are clear. No mastoid effusion. Inner ear structures grossly normal.  Other: None. MRA HEAD FINDINGS Anterior circulation: Visualized distal cervical segments of the internal carotid arteries are widely patent with antegrade flow. Petrous, cavernous, and supraclinoid segments widely patent without stenosis or other abnormality. A1 segments widely patent. Normal anterior communicating artery complex. Anterior cerebral arteries patent to their distal aspects without stenosis. No M1 stenosis or  occlusion. Normal MCA bifurcations. Distal MCA branches well perfused and symmetric. Posterior circulation: Visualized distal V4 segments widely patent bilaterally. Left vertebral artery dominant. Neither PICA origin visualized. Basilar diminutive but widely patent to its distal aspect. Superior cerebellar arteries patent bilaterally. Both PCA supplied via hypoplastic P1 segments and robust bilateral posterior communicating arteries. PCAs well perfused to their distal aspects without stenosis. Anatomic variants: Predominant fetal type origin of the PCAs with overall diminutive vertebrobasilar system. No intracranial aneurysm. MRA NECK FINDINGS Aortic arch: Visualized aortic arch normal in caliber with normal 3 vessel morphology. No stenosis or other abnormality about the origin of the great vessels. Right carotid system: Right common and internal carotid arteries widely patent without stenosis, evidence for dissection or occlusion. Left carotid system: Left common and internal carotid arteries widely patent without stenosis, evidence for dissection or occlusion. Vertebral arteries: Both vertebral arteries arise from the subclavian arteries. No proximal subclavian artery stenosis. Right vertebral artery dominant. Both vertebral arteries widely patent without stenosis, evidence for dissection or occlusion. Other: None IMPRESSION: MRI HEAD: 1. No acute intracranial infarct or other abnormality. 2. Small remote lacunar infarcts involving the anterior left external capsule/subinsular white matter and right frontal corona radiata. 3. 3 mm nodular density along the pituitary stalk, indeterminate. Correlation with pituitary function tests suggested. Additionally, follow-up examination with dedicated pituitary protocol MRI, with and without contrast, could be performed for further evaluation as warranted. 4. Otherwise normal brain MRI for age. MRA HEAD: 1. Normal intracranial MRA. No large vessel occlusion, hemodynamically  significant stenosis, or other acute vascular abnormality. 2. Predominant fetal type origin of the PCAs with overall diminutive vertebrobasilar system. MRA NECK: Normal MRA of the neck with wide patency of both carotid artery systems and vertebral arteries. Electronically Signed   By: Jeannine Boga M.D.   On: 11/28/2020 21:55   MR ANGIO NECK W WO CONTRAST  Result Date: 11/28/2020 CLINICAL DATA:  Initial evaluation for neuro deficit, stroke suspected. Right-sided numbness. EXAM: MRI HEAD WITHOUT CONTRAST MRA HEAD WITHOUT CONTRAST MRA NECK WITHOUT AND WITH CONTRAST TECHNIQUE: Multiplanar, multi-echo pulse sequences of the brain and surrounding structures were acquired without intravenous contrast. Angiographic images of the Circle of Willis were acquired using MRA technique without intravenous contrast. Angiographic images of the neck were acquired using MRA technique without and with intravenous contrast. Carotid stenosis measurements (when applicable) are obtained utilizing NASCET criteria, using the distal internal carotid diameter as the denominator. CONTRAST:  52m GADAVIST GADOBUTROL 1 MMOL/ML IV SOLN COMPARISON:  Prior CT from earlier the same day. FINDINGS: MRI HEAD FINDINGS Brain: Cerebral volume within normal limits for age. Small remote lacunar infarct noted involving the anterior left external capsule/subinsular white matter, corresponding with hypodensity seen on prior CT. Additional probable punctate remote lacunar infarct noted at the right frontal corona radiata (series 11, image 15). Underlying minimal chronic microvascular ischemic disease noted. No abnormal foci of restricted diffusion to suggest acute or subacute ischemia. Gray-white matter differentiation maintained. No encephalomalacia to suggest chronic cortical infarction. No foci of susceptibility artifact to suggest acute or chronic intracranial hemorrhage. 3 mm nodular density noted along the pituitary stalk (series 9, image 14),  indeterminate.  Pituitary gland and suprasellar region otherwise unremarkable. Midline structures intact. No other mass lesion, mass effect, or midline shift. No hydrocephalus or extra-axial fluid collection. Vascular: Major intracranial vascular flow voids are maintained. Skull and upper cervical spine: Craniocervical junction within normal limits. Bone marrow signal intensity somewhat diffusely decreased on T1 weighted imaging, nonspecific, but most commonly related to anemia, smoking, or obesity. No focal marrow replacing lesion. No scalp soft tissue abnormality. Sinuses/Orbits: Globes and orbital soft tissues within normal limits. Paranasal sinuses are clear. No mastoid effusion. Inner ear structures grossly normal. Other: None. MRA HEAD FINDINGS Anterior circulation: Visualized distal cervical segments of the internal carotid arteries are widely patent with antegrade flow. Petrous, cavernous, and supraclinoid segments widely patent without stenosis or other abnormality. A1 segments widely patent. Normal anterior communicating artery complex. Anterior cerebral arteries patent to their distal aspects without stenosis. No M1 stenosis or occlusion. Normal MCA bifurcations. Distal MCA branches well perfused and symmetric. Posterior circulation: Visualized distal V4 segments widely patent bilaterally. Left vertebral artery dominant. Neither PICA origin visualized. Basilar diminutive but widely patent to its distal aspect. Superior cerebellar arteries patent bilaterally. Both PCA supplied via hypoplastic P1 segments and robust bilateral posterior communicating arteries. PCAs well perfused to their distal aspects without stenosis. Anatomic variants: Predominant fetal type origin of the PCAs with overall diminutive vertebrobasilar system. No intracranial aneurysm. MRA NECK FINDINGS Aortic arch: Visualized aortic arch normal in caliber with normal 3 vessel morphology. No stenosis or other abnormality about the origin of  the great vessels. Right carotid system: Right common and internal carotid arteries widely patent without stenosis, evidence for dissection or occlusion. Left carotid system: Left common and internal carotid arteries widely patent without stenosis, evidence for dissection or occlusion. Vertebral arteries: Both vertebral arteries arise from the subclavian arteries. No proximal subclavian artery stenosis. Right vertebral artery dominant. Both vertebral arteries widely patent without stenosis, evidence for dissection or occlusion. Other: None IMPRESSION: MRI HEAD: 1. No acute intracranial infarct or other abnormality. 2. Small remote lacunar infarcts involving the anterior left external capsule/subinsular white matter and right frontal corona radiata. 3. 3 mm nodular density along the pituitary stalk, indeterminate. Correlation with pituitary function tests suggested. Additionally, follow-up examination with dedicated pituitary protocol MRI, with and without contrast, could be performed for further evaluation as warranted. 4. Otherwise normal brain MRI for age. MRA HEAD: 1. Normal intracranial MRA. No large vessel occlusion, hemodynamically significant stenosis, or other acute vascular abnormality. 2. Predominant fetal type origin of the PCAs with overall diminutive vertebrobasilar system. MRA NECK: Normal MRA of the neck with wide patency of both carotid artery systems and vertebral arteries. Electronically Signed   By: Jeannine Boga M.D.   On: 11/28/2020 21:55   MR BRAIN WO CONTRAST  Result Date: 11/28/2020 CLINICAL DATA:  Initial evaluation for neuro deficit, stroke suspected. Right-sided numbness. EXAM: MRI HEAD WITHOUT CONTRAST MRA HEAD WITHOUT CONTRAST MRA NECK WITHOUT AND WITH CONTRAST TECHNIQUE: Multiplanar, multi-echo pulse sequences of the brain and surrounding structures were acquired without intravenous contrast. Angiographic images of the Circle of Willis were acquired using MRA technique without  intravenous contrast. Angiographic images of the neck were acquired using MRA technique without and with intravenous contrast. Carotid stenosis measurements (when applicable) are obtained utilizing NASCET criteria, using the distal internal carotid diameter as the denominator. CONTRAST:  54m GADAVIST GADOBUTROL 1 MMOL/ML IV SOLN COMPARISON:  Prior CT from earlier the same day. FINDINGS: MRI HEAD FINDINGS Brain: Cerebral volume within normal limits for age. Small  remote lacunar infarct noted involving the anterior left external capsule/subinsular white matter, corresponding with hypodensity seen on prior CT. Additional probable punctate remote lacunar infarct noted at the right frontal corona radiata (series 11, image 15). Underlying minimal chronic microvascular ischemic disease noted. No abnormal foci of restricted diffusion to suggest acute or subacute ischemia. Gray-white matter differentiation maintained. No encephalomalacia to suggest chronic cortical infarction. No foci of susceptibility artifact to suggest acute or chronic intracranial hemorrhage. 3 mm nodular density noted along the pituitary stalk (series 9, image 14), indeterminate. Pituitary gland and suprasellar region otherwise unremarkable. Midline structures intact. No other mass lesion, mass effect, or midline shift. No hydrocephalus or extra-axial fluid collection. Vascular: Major intracranial vascular flow voids are maintained. Skull and upper cervical spine: Craniocervical junction within normal limits. Bone marrow signal intensity somewhat diffusely decreased on T1 weighted imaging, nonspecific, but most commonly related to anemia, smoking, or obesity. No focal marrow replacing lesion. No scalp soft tissue abnormality. Sinuses/Orbits: Globes and orbital soft tissues within normal limits. Paranasal sinuses are clear. No mastoid effusion. Inner ear structures grossly normal. Other: None. MRA HEAD FINDINGS Anterior circulation: Visualized distal  cervical segments of the internal carotid arteries are widely patent with antegrade flow. Petrous, cavernous, and supraclinoid segments widely patent without stenosis or other abnormality. A1 segments widely patent. Normal anterior communicating artery complex. Anterior cerebral arteries patent to their distal aspects without stenosis. No M1 stenosis or occlusion. Normal MCA bifurcations. Distal MCA branches well perfused and symmetric. Posterior circulation: Visualized distal V4 segments widely patent bilaterally. Left vertebral artery dominant. Neither PICA origin visualized. Basilar diminutive but widely patent to its distal aspect. Superior cerebellar arteries patent bilaterally. Both PCA supplied via hypoplastic P1 segments and robust bilateral posterior communicating arteries. PCAs well perfused to their distal aspects without stenosis. Anatomic variants: Predominant fetal type origin of the PCAs with overall diminutive vertebrobasilar system. No intracranial aneurysm. MRA NECK FINDINGS Aortic arch: Visualized aortic arch normal in caliber with normal 3 vessel morphology. No stenosis or other abnormality about the origin of the great vessels. Right carotid system: Right common and internal carotid arteries widely patent without stenosis, evidence for dissection or occlusion. Left carotid system: Left common and internal carotid arteries widely patent without stenosis, evidence for dissection or occlusion. Vertebral arteries: Both vertebral arteries arise from the subclavian arteries. No proximal subclavian artery stenosis. Right vertebral artery dominant. Both vertebral arteries widely patent without stenosis, evidence for dissection or occlusion. Other: None IMPRESSION: MRI HEAD: 1. No acute intracranial infarct or other abnormality. 2. Small remote lacunar infarcts involving the anterior left external capsule/subinsular white matter and right frontal corona radiata. 3. 3 mm nodular density along the  pituitary stalk, indeterminate. Correlation with pituitary function tests suggested. Additionally, follow-up examination with dedicated pituitary protocol MRI, with and without contrast, could be performed for further evaluation as warranted. 4. Otherwise normal brain MRI for age. MRA HEAD: 1. Normal intracranial MRA. No large vessel occlusion, hemodynamically significant stenosis, or other acute vascular abnormality. 2. Predominant fetal type origin of the PCAs with overall diminutive vertebrobasilar system. MRA NECK: Normal MRA of the neck with wide patency of both carotid artery systems and vertebral arteries. Electronically Signed   By: Jeannine Boga M.D.   On: 11/28/2020 21:55   EEG adult  Result Date: 11/29/2020 Lora Havens, MD     11/29/2020 10:17 AM Patient Name: Albert [name] MRN: [id number] Epilepsy Attending: Lora Havens Referring Physician/Provider: Dr Jenetta Downer Date: 11/29/2020 Duration: 22.07 mins  Patient history: 50 year old presenting with syncope followed by right-sided weakness. EEG to evaluate for seizures. Level of alertness: Awake AEDs during EEG study: None Technical aspects: This EEG study was done with scalp electrodes positioned according to the 10-20 International system of electrode placement. Electrical activity was acquired at a sampling rate of '500Hz'$  and reviewed with a high frequency filter of '70Hz'$  and a low frequency filter of '1Hz'$ . EEG data were recorded continuously and digitally stored. Description: The posterior dominant rhythm consists of '9Hz'$  activity of moderate voltage (25-35 uV) seen predominantly in posterior head regions, symmetric and reactive to eye opening and eye closing. Hyperventilation and photic stimulation were not performed.   IMPRESSION: This study is within normal limits. No seizures or epileptiform discharges were seen throughout the recording. Lora Havens   ECHOCARDIOGRAM COMPLETE  Result Date: 11/29/2020     ECHOCARDIOGRAM REPORT   Patient Name:   Albert [name] [place] Date of Exam: 11/29/2020 Medical Rec #:  [id number]            Height:       71.0 in Accession #:    [id number]           Weight:       310.8 lb Date of Birth:  1971/01/15             BSA:          2.543 m Patient Age:    69 years             BP:           119/59 mmHg Patient Gender: M                    HR:           63 bpm. Exam Location:  Inpatient Procedure: 2D Echo, Cardiac Doppler and Color Doppler Indications:    CVA  History:        Patient has prior history of Echocardiogram examinations, most                 recent 12/26/2019. Arrythmias:Atrial Fibrillation; Risk                 Factors:Hypertension.  Sonographer:    MH Referring Phys: [place]  1. Left ventricular ejection fraction, by estimation, is 55 to 60%. The left ventricle has normal function. The left ventricle has no regional wall motion abnormalities. Left ventricular diastolic parameters were normal.  2. Right ventricular systolic function was not well visualized. The right ventricular size is not well visualized.  3. The mitral valve is grossly normal. Trivial mitral valve regurgitation.  4. The aortic valve is grossly normal. Aortic valve regurgitation is not visualized. No aortic stenosis is present. FINDINGS  Left Ventricle: Left ventricular ejection fraction, by estimation, is 55 to 60%. The left ventricle has normal function. The left ventricle has no regional wall motion abnormalities. The left ventricular internal cavity size was normal in size. There is  no left ventricular hypertrophy. Left ventricular diastolic parameters were normal. Right Ventricle: The right ventricular size is not well visualized. Right vetricular wall thickness was not well visualized. Right ventricular systolic function was not well visualized. Left Atrium: Left atrial size was normal in size. Right Atrium: Right atrial size was normal in size. Pericardium: There is no evidence  of pericardial effusion. Mitral Valve: The mitral valve is grossly normal. Trivial mitral valve regurgitation. Tricuspid Valve: The tricuspid valve is not  well visualized. Tricuspid valve regurgitation is trivial. Aortic Valve: The aortic valve is grossly normal. Aortic valve regurgitation is not visualized. No aortic stenosis is present. Aortic valve mean gradient measures 4.0 mmHg. Aortic valve peak gradient measures 8.5 mmHg. Aortic valve area, by VTI measures 2.87 cm. Pulmonic Valve: The pulmonic valve was not well visualized. Pulmonic valve regurgitation is not visualized. Aorta: The aortic root and ascending aorta are structurally normal, with no evidence of dilitation. IAS/Shunts: The atrial septum is grossly normal.  LEFT VENTRICLE PLAX 2D LVIDd:         4.90 cm     Diastology LVIDs:         3.10 cm     LV e' medial:    9.90 cm/s LV PW:         0.80 cm     LV E/e' medial:  8.6 LV IVS:        1.90 cm     LV e' lateral:   7.94 cm/s LVOT diam:     2.30 cm     LV E/e' lateral: 10.7 LV SV:         94 LV SV Index:   37 LVOT Area:     4.15 cm  LV Volumes (MOD) LV vol d, MOD A4C: 54.9 ml LV vol s, MOD A4C: 19.3 ml LV SV MOD A4C:     54.9 ml RIGHT VENTRICLE TAPSE (M-mode): 3.4 cm LEFT ATRIUM           Index LA diam:      2.70 cm 1.06 cm/m LA Vol (A4C): 58.9 ml 23.16 ml/m  AORTIC VALVE                   PULMONIC VALVE AV Area (Vmax):    2.82 cm    PV Vmax:       0.66 m/s AV Area (Vmean):   2.71 cm    PV Peak grad:  1.7 mmHg AV Area (VTI):     2.87 cm AV Vmax:           146.00 cm/s AV Vmean:          97.500 cm/s AV VTI:            0.327 m AV Peak Grad:      8.5 mmHg AV Mean Grad:      4.0 mmHg LVOT Vmax:         99.20 cm/s LVOT Vmean:        63.700 cm/s LVOT VTI:          0.226 m LVOT/AV VTI ratio: 0.69  AORTA Ao Root diam: 2.70 cm Ao Asc diam:  3.00 cm MITRAL VALVE MV Area (PHT): 3.33 cm    SHUNTS MV E velocity: 84.80 cm/s  Systemic VTI:  0.23 m MV A velocity: 63.40 cm/s  Systemic Diam: 2.30 cm MV E/A ratio:   1.34 Mertie Moores MD Electronically signed by Mertie Moores MD Signature Date/Time: 11/29/2020/3:37:03 PM    Final    CT HEAD CODE STROKE WO CONTRAST  Result Date: 11/28/2020 CLINICAL DATA:  Code stroke. Neuro deficit, acute, stroke suspected. Additional history provided: Syncopal episode 1 hour ago, patient awoke with right-sided numbness. EXAM: CT HEAD WITHOUT CONTRAST TECHNIQUE: Contiguous axial images were obtained from the base of the skull through the vertex without intravenous contrast. COMPARISON:  No pertinent prior exams available for comparison. FINDINGS: Brain: Cerebral volume is normal. Subtle asymmetric hypodensity is questioned within the anterior left  subinsular white matter which could reflect an age-indeterminate lacunar infarct (for instance as seen on series 4, image 31). There is no acute intracranial hemorrhage. No demarcated cortical infarct. No extra-axial fluid collection. No evidence of an intracranial mass. No midline shift. Vascular: No hyperdense vessel. Skull: Normal. Negative for fracture or focal lesion. Sinuses/Orbits: Visualized orbits show no acute finding. No significant paranasal sinus disease at the imaged levels. Other: Periapical lucency surrounding the right mandibular second and third molars, partially imaged. ASPECTS ([place] Stroke Program Early CT Score) - Ganglionic level infarction (caudate, lentiform nuclei, internal capsule, insula, M1-M3 cortex): 7 - Supraganglionic infarction (M4-M6 cortex): 3 Total score (0-10 with 10 being normal): 10 These results were called by telephone at the time of interpretation on 11/28/2020 at 3:03 pm to provider [facility] , who verbally acknowledged these results. IMPRESSION: Subtle asymmetric hypodensity questioned within the anterior left subinsular white matter, which could reflect an age-indeterminate lacunar infarct. Consider a brain MRI for further evaluation. No acute demarcated cortical infarct. No evidence of acute  intracranial hemorrhage. Electronically Signed   By: Kellie Simmering D.O.   On: 11/28/2020 15:03     Subjective: - no chest pain, shortness of breath, no abdominal pain, nausea or vomiting.   Discharge Exam: BP 122/87 (BP Location: Left Arm)   Pulse 65   Temp 98.7 F (37.1 C) (Oral)   Resp 16   Ht '5\' 11"'$  (1.803 m)   Wt 136.1 kg   SpO2 98%   BMI 41.84 kg/m   General: Pt is alert, awake, not in acute distress Cardiovascular: RRR, S1/S2 +, no rubs, no gallops Respiratory: CTA bilaterally, no wheezing, no rhonchi Abdominal: Soft, NT, ND, bowel sounds + Extremities: no edema, no cyanosis  The results of significant diagnostics from this hospitalization (including imaging, microbiology, ancillary and laboratory) are listed below for reference.     Microbiology: Recent Results (from the past 240 hour(s))  Resp Panel by RT-PCR (Flu A&B, Covid) Nasopharyngeal Swab     Status: None   Collection Time: 11/28/20  3:24 PM   Specimen: Nasopharyngeal Swab; Nasopharyngeal(NP) swabs in vial transport medium  Result Value Ref Range Status   SARS Coronavirus 2 by RT PCR NEGATIVE NEGATIVE Final    Comment: (NOTE) SARS-CoV-2 target nucleic acids are NOT DETECTED.  The SARS-CoV-2 RNA is generally detectable in upper respiratory specimens during the acute phase of infection. The lowest concentration of SARS-CoV-2 viral copies this assay can detect is 138 copies/mL. A negative result does not preclude SARS-Cov-2 infection and should not be used as the sole basis for treatment or other patient management decisions. A negative result may occur with  improper specimen collection/handling, submission of specimen other than nasopharyngeal swab, presence of viral mutation(s) within the areas targeted by this assay, and inadequate number of viral copies(<138 copies/mL). A negative result must be combined with clinical observations, patient history, and epidemiological information. The expected result is  Negative.  Fact Sheet for Patients:  EntrepreneurPulse.com.au  Fact Sheet for Healthcare Providers:  IncredibleEmployment.be  This test is no t yet approved or cleared by the [place] FDA and  has been authorized for detection and/or diagnosis of SARS-CoV-2 by FDA under an Emergency Use Authorization (EUA). This EUA will remain  in effect (meaning this test can be used) for the duration of the COVID-19 declaration under Section 564(b)(1) of the Act, 21 U.S.C.section 360bbb-3(b)(1), unless the authorization is terminated  or revoked sooner.       Influenza A by  PCR NEGATIVE NEGATIVE Final   Influenza B by PCR NEGATIVE NEGATIVE Final    Comment: (NOTE) The Xpert Xpress SARS-CoV-2/FLU/RSV plus assay is intended as an aid in the diagnosis of influenza from Nasopharyngeal swab specimens and should not be used as a sole basis for treatment. Nasal washings and aspirates are unacceptable for Xpert Xpress SARS-CoV-2/FLU/RSV testing.  Fact Sheet for Patients: EntrepreneurPulse.com.au  Fact Sheet for Healthcare Providers: IncredibleEmployment.be  This test is not yet approved or cleared by the [place] FDA and has been authorized for detection and/or diagnosis of SARS-CoV-2 by FDA under an Emergency Use Authorization (EUA). This EUA will remain in effect (meaning this test can be used) for the duration of the COVID-19 declaration under Section 564(b)(1) of the Act, 21 U.S.C. section 360bbb-3(b)(1), unless the authorization is terminated or revoked.  Performed at [facility], [address] [place], [place] [address]      Labs: Basic Metabolic Panel: Recent Labs  Lab 11/28/20 1514  NA 137  K 4.0  CL 106  CO2 25  GLUCOSE 145*  BUN 26*  CREATININE 1.75*  CALCIUM 8.7*   Liver Function Tests: Recent Labs  Lab 11/28/20 1514  AST 34  ALT 36  ALKPHOS 42  BILITOT 0.4  PROT 7.6  ALBUMIN 4.5    CBC: Recent Labs  Lab 11/28/20 1514  WBC 8.7  NEUTROABS 5.6  HGB 18.3*  HCT 52.3*  MCV 92.2  PLT 231   CBG: Recent Labs  Lab 11/28/20 1446  GLUCAP 159*   Hgb A1c Recent Labs    11/29/20 0124  HGBA1C 5.5   Lipid Profile Recent Labs    11/29/20 0124  CHOL 148  HDL 35*  LDLCALC 100*  TRIG 63  CHOLHDL 4.2   Thyroid function studies No results for input(s): TSH, T4TOTAL, T3FREE, THYROIDAB in the last 72 hours.  Invalid input(s): FREET3 Urinalysis    Component Value Date/Time   COLORURINE YELLOW 11/28/2020 [place] 11/28/2020 1451   LABSPEC 1.020 11/28/2020 1451   PHURINE 5.5 11/28/2020 1451   GLUCOSEU NEGATIVE 11/28/2020 1451   [place] 11/28/2020 1451   BILIRUBINUR NEGATIVE 11/28/2020 1451   KETONESUR NEGATIVE 11/28/2020 1451   PROTEINUR NEGATIVE 11/28/2020 1451   NITRITE NEGATIVE 11/28/2020 1451   LEUKOCYTESUR NEGATIVE 11/28/2020 1451    FURTHER DISCHARGE INSTRUCTIONS:   Get Medicines reviewed and adjusted: Please take all your medications with you for your next visit with your Primary MD   Laboratory/radiological data: Please request your Primary MD to go over all hospital tests and procedure/radiological results at the follow up, please ask your Primary MD to get all Hospital records sent to his/her office.   In some cases, they will be blood work, cultures and biopsy results pending at the time of your discharge. Please request that your primary care M.D. goes through all the records of your hospital data and follows up on these results.   Also Note the following: If you experience worsening of your admission symptoms, develop shortness of breath, life threatening emergency, suicidal or homicidal thoughts you must seek medical attention immediately by calling 911 or calling your MD immediately  if symptoms less severe.   You must read complete instructions/literature along with all the possible adverse reactions/side effects  for all the Medicines you take and that have been prescribed to you. Take any new Medicines after you have completely understood and accpet all the possible adverse reactions/side effects.    Do not drive when  taking Pain medications or sleeping medications (Benzodaizepines)   Do not take more than prescribed Pain, Sleep and Anxiety Medications. It is not advisable to combine anxiety,sleep and pain medications without talking with your primary care practitioner   Special Instructions: If you have smoked or chewed Tobacco  in the last 2 yrs please stop smoking, stop any regular Alcohol  and or any Recreational drug use.   Wear Seat belts while driving.   Please note: You were cared for by a hospitalist during your hospital stay. Once you are discharged, your primary care physician will handle any further medical issues. Please note that NO REFILLS for any discharge medications will be authorized once you are discharged, as it is imperative that you return to your primary care physician (or establish a relationship with a primary care physician if you do not have one) for your post hospital discharge needs so that they can reassess your need for medications and monitor your lab values.  Time coordinating discharge: 40 minutes  SIGNED:  Marzetta Board, MD, PhD 11/29/2020, 3:53 PM

## 2020-11-29 NOTE — Progress Notes (Signed)
EEG complete - results pending 

## 2020-11-29 NOTE — TOC CAGE-AID Note (Signed)
Transition of Care [facility]) - CAGE-AID Screening   Patient Details  Name: Albert [name] MRN: [id number] Date of Birth: 02-03-1971  Transition of Care [facility]) CM/SW Contact:    Glendel Jaggers C Tarpley-Carter, [place] Phone Number: 11/29/2020, 2:27 PM   Clinical Narrative: Pt participated in [place].  Pt stated he does not use substance or ETOH.  Pt stated he used a Hemp product for pain. Pt was not offered resources, due to no usage of substance or ETOH.     Sherryn Pollino Tarpley-Carter, MSW, LCSW-A Pronouns:  She/Her/Hers [facility] Health[facility]itions of Care Clinical Social Worker Direct Number:  [phone] [email or web address]   CAGE-AID Screening:    Have You Ever Felt You Ought to Cut Down on Your Drinking or Drug Use?: No Have People Annoyed You By [employer] Your Drinking Or Drug Use?: No Have You Felt Bad Or Guilty About Your Drinking Or Drug Use?: No Have You Ever Had a Drink or Used Drugs First Thing In The Morning to Steady Your Nerves or to Get Rid of a Hangover?: No CAGE-AID Score: 0  Substance Abuse Education Offered: No

## 2020-11-29 NOTE — Procedures (Signed)
Patient Name: Albert [name]  MRN: [id number]  Epilepsy Attending: Lora Havens  Referring Physician/Provider: Dr Jenetta Downer  Date: 11/29/2020  Duration: 22.07 mins  Patient history: 50 year old presenting with syncope followed by right-sided weakness. EEG to evaluate for seizures.  Level of alertness: Awake  AEDs during EEG study: None  Technical aspects: This EEG study was done with scalp electrodes positioned according to the 10-20 International system of electrode placement. Electrical activity was acquired at a sampling rate of '500Hz'$  and reviewed with a high frequency filter of '70Hz'$  and a low frequency filter of '1Hz'$ . EEG data were recorded continuously and digitally stored.   Description: The posterior dominant rhythm consists of '9Hz'$  activity of moderate voltage (25-35 uV) seen predominantly in posterior head regions, symmetric and reactive to eye opening and eye closing. Hyperventilation and photic stimulation were not performed.     IMPRESSION: This study is within normal limits. No seizures or epileptiform discharges were seen throughout the recording.  Leesa Leifheit Barbra Sarks

## 2020-11-29 NOTE — TOC Transition Note (Signed)
Transition of Care [facility]) - CM/SW Discharge Note   Patient Details  Name: Rommie Zanghi MRN: [id number] Date of Birth: [birth year]  Transition of Care [facility]) CM/SW Contact:  Pollie Friar, RN Phone Number: 11/29/2020, 4:02 PM   Clinical Narrative:    Patient is discharging home with self care. No needs per TOC.   Final next level of care: Home/Self Care Barriers to Discharge: No Barriers Identified   Patient Goals and CMS Choice        Discharge Placement                       Discharge Plan and Services                                     Social Determinants of Health (SDOH) Interventions     Readmission Risk Interventions No flowsheet data found.

## 2020-11-30 ENCOUNTER — Other Ambulatory Visit (HOSPITAL_COMMUNITY): Payer: Self-pay | Admitting: Physician Assistant

## 2020-12-12 ENCOUNTER — Emergency Department (HOSPITAL_COMMUNITY): Payer: BC Managed Care – PPO

## 2020-12-12 ENCOUNTER — Other Ambulatory Visit: Payer: Self-pay

## 2020-12-12 ENCOUNTER — Emergency Department (HOSPITAL_COMMUNITY)
Admission: EM | Admit: 2020-12-12 | Discharge: 2020-12-12 | Discharge status: Against Medical Advice | Payer: BC Managed Care – PPO

## 2020-12-12 ENCOUNTER — Emergency Department (HOSPITAL_COMMUNITY)
Admission: EM | Admit: 2020-12-12 | Discharge: 2020-12-13 | Disposition: A | Discharge status: Home / Self Care | Payer: BC Managed Care – PPO | Attending: Emergency Medicine | Admitting: Emergency Medicine

## 2020-12-12 DIAGNOSIS — I480 Paroxysmal atrial fibrillation: Secondary | ICD-10-CM | POA: Insufficient documentation

## 2020-12-12 DIAGNOSIS — Z7901 Long term (current) use of anticoagulants: Secondary | ICD-10-CM | POA: Diagnosis not present

## 2020-12-12 DIAGNOSIS — E119 Type 2 diabetes mellitus without complications: Secondary | ICD-10-CM | POA: Diagnosis not present

## 2020-12-12 DIAGNOSIS — I63543 Cerebral infarction due to unspecified occlusion or stenosis of bilateral cerebellar arteries: Secondary | ICD-10-CM | POA: Insufficient documentation

## 2020-12-12 DIAGNOSIS — I10 Essential (primary) hypertension: Secondary | ICD-10-CM | POA: Insufficient documentation

## 2020-12-12 DIAGNOSIS — R002 Palpitations: Secondary | ICD-10-CM

## 2020-12-12 LAB — BASIC METABOLIC PANEL
Anion gap: 7 (ref 5–15)
BUN: 36 mg/dL — ABNORMAL HIGH (ref 6–20)
CO2: 25 mmol/L (ref 22–32)
Calcium: 9.2 mg/dL (ref 8.9–10.3)
Chloride: 106 mmol/L (ref 98–111)
Creatinine, Ser: 1.52 mg/dL — ABNORMAL HIGH (ref 0.61–1.24)
GFR, Estimated: 55 mL/min — ABNORMAL LOW (ref >60)
Glucose, Bld: 120 mg/dL — ABNORMAL HIGH (ref 70–99)
Potassium: 4 mmol/L (ref 3.5–5.1)
Sodium: 138 mmol/L (ref 135–145)

## 2020-12-12 LAB — BRAIN NATRIURETIC PEPTIDE: B Natriuretic Peptide: 429 pg/mL — ABNORMAL HIGH (ref 0.0–100.0)

## 2020-12-12 LAB — CBC WITH DIFFERENTIAL/PLATELET
Abs Immature Granulocytes: 0.07 10*3/uL (ref 0.00–0.07)
Basophils Absolute: 0.1 10*3/uL (ref 0.0–0.1)
Basophils Relative: 1 %
Eosinophils Absolute: 0.3 10*3/uL (ref 0.0–0.5)
Eosinophils Relative: 3 %
HCT: 55.5 % — ABNORMAL HIGH (ref 39.0–52.0)
Hemoglobin: 19.3 g/dL — ABNORMAL HIGH (ref 13.0–17.0)
Immature Granulocytes: 1 %
Lymphocytes Relative: 26 %
Lymphs Abs: 2.7 10*3/uL (ref 0.7–4.0)
MCH: 31.8 pg (ref 26.0–34.0)
MCHC: 34.8 g/dL (ref 30.0–36.0)
MCV: 91.4 fL (ref 80.0–100.0)
Monocytes Absolute: 1 10*3/uL (ref 0.1–1.0)
Monocytes Relative: 10 %
Neutro Abs: 6.3 10*3/uL (ref 1.7–7.7)
Neutrophils Relative %: 59 %
Platelets: 256 10*3/uL (ref 150–400)
RBC: 6.07 MIL/uL — ABNORMAL HIGH (ref 4.22–5.81)
RDW: 13.4 % (ref 11.5–15.5)
WBC: 10.5 10*3/uL (ref 4.0–10.5)
nRBC: 0 % (ref 0.0–0.2)

## 2020-12-12 LAB — TROPONIN I (HIGH SENSITIVITY): Troponin I (High Sensitivity): 9 ng/L (ref <18)

## 2020-12-12 NOTE — ED Triage Notes (Signed)
Pt states a fib onset today at 7:15. Is followed by a cardiologist, has daily lopressor, and has been prescribe "rescue" Cardizem, which he took at 7:35 pm. Denies CP/SOB/Dizziness at this time. States his chest feels like its "fluttering" but over feels better. Triage EKG shows: NSR

## 2020-12-12 NOTE — ED Provider Notes (Signed)
[facility] EMERGENCY DEPARTMENT Provider Note   CSN: [id number] Arrival date & time: 12/12/20  2220     History Chief Complaint  Patient presents with   Atrial Fibrillation    Albert [name] is a 50 y.o. male.  Patient with a history of atrial fibrillation on Eliquis and metoprolol, diabetes, hypertension, gout presenting with palpitations and episode of atrial fibrillation.  States around 7:15 PM he felt palpitations around 160 bpm and his apple watch told him he was in atrial fibrillation.  This was shortly after taking his regular medications including metoprolol and Eliquis.  He took his rescue Cardizem about 10 minutes later.  Initially went to [facility] but left without being seen.  States he feels back in regular rhythm now.  Estimates he was in atrial fibrillation for 2 or 3 hours.  He is sinus rhythm on arrival.  Feels back to baseline.  Denies any chest pain or shortness of breath.  Denies any room spinning dizziness or lightheadedness.  No difficulty breathing.  No chest pain. No abdominal pain, nausea or vomiting.  States compliance with his medication has not missed any doses of Eliquis since October He last took a dose of Cardizem about 2 weeks ago  The history is provided by the patient.  Atrial Fibrillation Pertinent negatives include no chest pain, no abdominal pain and no shortness of breath.      Past Medical History:  Diagnosis Date   Atrial fibrillation ([place])    Diabetes mellitus without complication ([place])    GERD (gastroesophageal reflux disease)    Gout    Hypertension    Low testosterone    Sleep apnea     Patient Active Problem List   Diagnosis Date Noted   Right sided numbness 11/28/2020   Paroxysmal atrial fibrillation ([place]) 01/06/2020   Secondary hypercoagulable state ([place]) 01/06/2020   Atrial fibrillation with rapid ventricular response ([place]) 12/26/2019   Essential hypertension 12/26/2019   ARF (acute renal failure) ([place]) 12/26/2019    Atrial fibrillation with RVR ([place]) 12/26/2019    Past Surgical History:  Procedure Laterality Date   CARDIAC CATHETERIZATION     COLONOSCOPY     COLONOSCOPY N/A 05/11/2015   Procedure: COLONOSCOPY;  Surgeon: Rogene Houston, MD;  Location: [facility];  Service: Endoscopy;  Laterality: N/A;  930 - moved to 2/15 - Ann to notify   COLONOSCOPY WITH PROPOFOL N/A 09/07/2020   Procedure: COLONOSCOPY WITH PROPOFOL;  Surgeon: Rogene Houston, MD;  Location: [facility] END[facility] SUITE;  Service: Endoscopy;  Laterality: N/A;  am   POLYPECTOMY  09/07/2020   Procedure: POLYPECTOMY;  Surgeon: Rogene Houston, MD;  Location: [facility] SU[facility];  Service: Endoscopy;;       Family History  Problem Relation Age of Onset   Colon cancer Father     Social History   Tobacco Use   Smoking status: Never   Smokeless tobacco: Never  Vaping Use   Vaping Use: Never used  Substance Use Topics   Alcohol use: Not Currently    Comment: occasionally   Drug use: No    Comment: reports use of CBG gummies    Home Medications Prior to Admission medications   Medication Sig Start Date End Date Taking? Authorizing Provider  acetaminophen (TYLENOL) 650 MG CR tablet Take 650 mg by mouth every 8 (eight) hours as needed for pain.    [provider]  atorvastatin (LIPITOR) 40 MG tablet Take 1 tablet (40 mg total) by mouth  daily. 11/30/20   Caren Griffins, MD  Cholecalciferol (DIALYVITE VITAMIN D 5000) 125 MCG (5000 UT) capsule Take 5,000 Units by mouth daily. Patient not taking: Reported on 11/28/2020    [provider]  clobetasol cream (TEMOVATE) [id number] % Apply 1 application topically 2 (two) times daily as needed (itching). Patient not taking: Reported on 11/28/2020    [provider]  colchicine 0.6 MG tablet Take 0.6 mg by mouth 2 (two) times daily as needed (gout flare up).  05/16/19   [provider]  diltiazem (CARDIZEM) 30 MG tablet Take 1 tablet every 4 hours AS NEEDED for AFIB heart rate  >100 03/04/20   Fenton, Clint R, PA  dronedarone (MULTAQ) 400 MG tablet Take 1 tablet (400 mg total) by mouth 2 (two) times daily with a meal. 11/08/20   Fenton, Clint R, PA  ELIQUIS 5 MG TABS tablet TAKE 1 TABLET TWICE A DAY 11/30/20   Fenton, Clint R, PA  febuxostat (ULORIC) 40 MG tablet Take 40 mg by mouth daily.    [provider]  fluticasone (FLONASE) 50 MCG/ACT nasal spray Place 1 spray into both nostrils daily as needed for allergies or rhinitis. Patient not taking: Reported on 11/28/2020    [provider]  Magnesium 200 MG TABS Take 800 mg by mouth daily. Taking 4 tablets by mouth daily- '800mg'$  total    [provider]  Melatonin 10 MG CAPS Take 10 mg by mouth at bedtime.    [provider]  meloxicam (MOBIC) 15 MG tablet Take 15 mg by mouth as needed for pain.  12/16/19   [provider]  metoprolol tartrate (LOPRESSOR) 25 MG tablet TAKE 1 TABLET TWICE A DAY 11/30/20   Fenton, Clint R, PA  pantoprazole (PROTONIX) 40 MG tablet Take 1 tablet (40 mg total) by mouth daily. 09/24/19   Long, Wonda Olds, MD  Potassium 99 MG TABS Take 99 mg by mouth every evening.    [provider]  tadalafil (CIALIS) 20 MG tablet Take 20 mg by mouth daily as needed for erectile dysfunction.    [provider]  testosterone cypionate (DEPOTESTOSTERONE CYPIONATE) 200 MG/ML injection Inject 200 mg into the muscle every 14 (fourteen) days.    [provider]  tetrahydrozoline-zinc (VISINE-AC) 0.05-0.25 % ophthalmic solution Place 2 drops into both eyes 3 (three) times daily as needed (dry eyes). Patient not taking: Reported on 11/28/2020    [provider]  vitamin B-12 (CYANOCOBALAMIN) 100 MCG tablet Take 100 mcg by mouth daily.    [provider]    Allergies    Penicillins and Levaquin [levofloxacin]  Review of Systems   Review of Systems  Constitutional:  Negative for activity change, appetite change and fever.  HENT:  Negative  for congestion and rhinorrhea.   Respiratory:  Negative for cough, chest tightness and shortness of breath.   Cardiovascular:  Positive for palpitations. Negative for chest pain.  Gastrointestinal:  Negative for abdominal pain, nausea and vomiting.  Genitourinary:  Negative for dysuria and hematuria.  Musculoskeletal:  Negative for arthralgias and myalgias.  Skin:  Negative for rash.  Neurological:  Negative for dizziness, weakness, light-headedness and numbness.   all other systems are negative except as noted in the HPI and PMH.   Physical Exam Updated Vital Signs BP 135/71   Pulse 85   Temp 98.9 F (37.2 C) (Oral)   Resp (!) 23   Ht '5\' 11"'$  (1.803 m)   Wt 136.1 kg  SpO2 97%   BMI 41.84 kg/m   Physical Exam Vitals and nursing note reviewed.  Constitutional:      General: He is not in acute distress.    Appearance: He is well-developed.  HENT:     Head: Normocephalic and atraumatic.     Mouth/Throat:     Pharynx: No oropharyngeal exudate.  Eyes:     Conjunctiva/sclera: Conjunctivae normal.     Pupils: Pupils are equal, round, and reactive to light.  Neck:     Comments: No meningismus. Cardiovascular:     Rate and Rhythm: Normal rate and regular rhythm.     Heart sounds: Normal heart sounds. No murmur heard. Pulmonary:     Effort: Pulmonary effort is normal. No respiratory distress.     Breath sounds: Normal breath sounds.  Abdominal:     Palpations: Abdomen is soft.     Tenderness: There is no abdominal tenderness. There is no guarding or rebound.  Musculoskeletal:        General: No tenderness. Normal range of motion.     Cervical back: Normal range of motion and neck supple.  Skin:    General: Skin is warm.  Neurological:     Mental Status: He is alert and oriented to person, place, and time.     Cranial Nerves: No cranial nerve deficit.     Motor: No abnormal muscle tone.     Coordination: Coordination normal.     Comments:  5/5 strength throughout. CN 2-12  intact.Equal grip strength.   Psychiatric:        Behavior: Behavior normal.    ED Results / Procedures / Treatments   Labs (all labs ordered are listed, but only abnormal results are displayed) Labs Reviewed  CBC WITH DIFFERENTIAL/PLATELET - Abnormal; Notable for the following components:      Result Value   RBC 6.07 (*)    Hemoglobin 19.3 (*)    HCT 55.5 (*)    All other components within normal limits  BASIC METABOLIC PANEL - Abnormal; Notable for the following components:   Glucose, Bld 120 (*)    BUN 36 (*)    Creatinine, Ser 1.52 (*)    GFR, Estimated 55 (*)    All other components within normal limits  BRAIN NATRIURETIC PEPTIDE - Abnormal; Notable for the following components:   B Natriuretic Peptide 429.0 (*)    All other components within normal limits  TROPONIN I (HIGH SENSITIVITY)  TROPONIN I (HIGH SENSITIVITY)    EKG EKG Interpretation  Date/Time:  Monday December 12 2020 22:37:19 EDT Ventricular Rate:  87 PR Interval:  190 QRS Duration: 102 QT Interval:  342 QTC Calculation: 411 R Axis:   -28 Text Interpretation: Normal sinus rhythm Anterior infarct , age undetermined Abnormal ECG No significant change was found Confirmed by Ezequiel Essex [phone]) on 12/12/2020 10:56:20 PM  Radiology DG Chest Portable 1 View  Result Date: 12/12/2020 CLINICAL DATA:  Chest pain. EXAM: PORTABLE CHEST 1 VIEW COMPARISON:  Chest x-ray 12/25/2019.  [place]teen FINDINGS: The heart size and mediastinal contours are within normal limits. Both lungs are clear. The visualized skeletal structures are unremarkable. IMPRESSION: No active disease. Electronically Signed   By: Ronney Asters M.D.   On: 12/12/2020 23:08    Procedures Procedures   Medications Ordered in ED Medications - No data to display  ED Course  I have reviewed the triage vital signs and the nursing notes.  Pertinent labs & imaging results that were available during my  care of the patient were reviewed by me and  considered in my medical decision making (see chart for details).    MDM Rules/Calculators/A&P                           Palpitations with suspected atrial fibrillation now resolved.  Patient in sinus rhythm on arrival.  No chest pain or shortness of breath  Electrolytes are reassuring.  Creatinine is at baseline.  Troponin negative. Appears hemoconcentrated.   Patient remains asymptomatic.  Has stayed in sinus rhythm throughout ED course.  No chest pain or shortness of breath.  No dizziness.  Troponin negative x2.  Discussed follow-up with atrial fibrillation clinic.  No medication adjustments at this time.  Return precautions discussed Final Clinical Impression(s) / ED Diagnoses Final diagnoses:  Palpitations  Paroxysmal atrial fibrillation [facility])    Rx / DC Orders ED Discharge Orders     None        Tanikka Bresnan, Annie Main, MD 12/13/20 [phone]

## 2020-12-13 LAB — TROPONIN I (HIGH SENSITIVITY): Troponin I (High Sensitivity): 8 ng/L (ref <18)

## 2020-12-13 NOTE — ED Notes (Signed)
Discharge instructions discussed with pt. Pt verbalized understanding with no questions at this time.  

## 2020-12-13 NOTE — Discharge Instructions (Signed)
Continue your medications as prescribed.  Follow-up with the atrial fibrillation clinic.  Return to the ED with chest pain, shortness of breath, dizziness, or other concerns

## 2020-12-20 ENCOUNTER — Ambulatory Visit (HOSPITAL_COMMUNITY): Payer: BC Managed Care – PPO | Admitting: Physician Assistant

## 2020-12-26 ENCOUNTER — Ambulatory Visit (HOSPITAL_COMMUNITY)
Admission: RE | Admit: 2020-12-26 | Discharge: 2020-12-26 | Disposition: A | Discharge status: Home / Self Care | Payer: BC Managed Care – PPO | Source: Ambulatory Visit | Attending: Physician Assistant | Admitting: Physician Assistant

## 2020-12-26 ENCOUNTER — Other Ambulatory Visit: Payer: Self-pay

## 2020-12-26 ENCOUNTER — Encounter (HOSPITAL_COMMUNITY): Payer: Self-pay | Admitting: Physician Assistant

## 2020-12-26 VITALS — BP 130/80 | HR 58 | Ht 71.0 in | Wt 313.6 lb

## 2020-12-26 DIAGNOSIS — K219 Gastro-esophageal reflux disease without esophagitis: Secondary | ICD-10-CM | POA: Diagnosis not present

## 2020-12-26 DIAGNOSIS — Z888 Allergy status to other drugs, medicaments and biological substances status: Secondary | ICD-10-CM | POA: Diagnosis not present

## 2020-12-26 DIAGNOSIS — E119 Type 2 diabetes mellitus without complications: Secondary | ICD-10-CM | POA: Diagnosis not present

## 2020-12-26 DIAGNOSIS — Z79899 Other long term (current) drug therapy: Secondary | ICD-10-CM | POA: Diagnosis not present

## 2020-12-26 DIAGNOSIS — Z7901 Long term (current) use of anticoagulants: Secondary | ICD-10-CM | POA: Diagnosis not present

## 2020-12-26 DIAGNOSIS — I10 Essential (primary) hypertension: Secondary | ICD-10-CM | POA: Insufficient documentation

## 2020-12-26 DIAGNOSIS — Z880 Allergy status to penicillin: Secondary | ICD-10-CM | POA: Insufficient documentation

## 2020-12-26 DIAGNOSIS — D6869 Other thrombophilia: Secondary | ICD-10-CM | POA: Insufficient documentation

## 2020-12-26 DIAGNOSIS — Z6841 Body Mass Index (BMI) 40.0 and over, adult: Secondary | ICD-10-CM | POA: Insufficient documentation

## 2020-12-26 DIAGNOSIS — I480 Paroxysmal atrial fibrillation: Secondary | ICD-10-CM | POA: Diagnosis present

## 2020-12-26 DIAGNOSIS — E669 Obesity, unspecified: Secondary | ICD-10-CM | POA: Insufficient documentation

## 2020-12-26 DIAGNOSIS — G4733 Obstructive sleep apnea (adult) (pediatric): Secondary | ICD-10-CM | POA: Diagnosis not present

## 2020-12-26 NOTE — Progress Notes (Signed)
Primary Care Physician: Monico Blitz, MD Primary Cardiologist: Dr Stanford Breed Primary Electrophysiologist: none Referring Physician: Dr Clifton James Albert [name] is a 50 y.o. male with a history of DM, HTN, GERD, OSA, and paroxysmal atrial fibrillation who presents for follow up in the [facility]. The patient was initially diagnosed with atrial fibrillation 12/25/19 after presenting to the ED with symptoms of palpitations, SOB, and some chest heaviness. He states he had brief, intermittent palpitations for two weeks prior. Patient was started on Eliquis for a CHADS2VASC score of 2. He converted back to SR spontaneously in the ED. Since then, he has had some mild palpitations almost daily. He did have another extended episode of heart racing on 10/8 which resolved when he took his BB. He is compliant with his CPAP and denies any alcohol use.   On follow up today, patient was hospitalized 11/28/20 for a suspected CVA. He lost consciousness while watching TV for 10 seconds and when he woke he  had right sided numbness and limb heaviness. Imaging showed no new infarct but it did show evidence of a prior CVA. Etiology was unclear with question for TIA. Patient does report he missed one dose of Eliquis a few days before the onset of his symptoms. Patient was also seen at the ED 12/12/20 for afib lasting 2-3 hours. He converted to SR prior to arrival.   Today, he denies symptoms of chest pain, shortness of breath, orthopnea, PND, lower extremity edema, dizziness, presyncope, syncope, bleeding, or neurologic sequela. The patient is tolerating medications without difficulties and is otherwise without complaint today.    Atrial Fibrillation Risk Factors:  he does have symptoms or diagnosis of sleep apnea. he is compliant with CPAP therapy. he does not have a history of rheumatic fever. he does not have a history of alcohol use. The patient does not have a history of early  familial atrial fibrillation or other arrhythmias.  he has a BMI of Body mass index is 43.74 kg/m.[name] Filed Weights   12/26/20 0855  Weight: (!) 142.2 kg     Family History  Problem Relation Age of Onset   Colon cancer Father      Atrial Fibrillation Management history:  Previous antiarrhythmic drugs: Multaq Previous cardioversions: none Previous ablations: none CHADS2VASC score: 4 Anticoagulation history: Eliquis   Past Medical History:  Diagnosis Date   Atrial fibrillation ([place])    Diabetes mellitus without complication (HCC)    GERD (gastroesophageal reflux disease)    Gout    Hypertension    Low testosterone    Sleep apnea    Past Surgical History:  Procedure Laterality Date   CARDIAC CATHETERIZATION     COLONOSCOPY     COLONOSCOPY N/A 05/11/2015   Procedure: COLONOSCOPY;  Surgeon: Rogene Houston, MD;  Location: [facility];  Service: Endoscopy;  Laterality: N/A;  930 - moved to 2/15 - Ann to notify   COLONOSCOPY WITH PROPOFOL N/A 09/07/2020   Procedure: COLONOSCOPY WITH PROPOFOL;  Surgeon: Rogene Houston, MD;  Location: [facility];  Service: Endoscopy;  Laterality: N/A;  am   POLYPECTOMY  09/07/2020   Procedure: POLYPECTOMY;  Surgeon: Rogene Houston, MD;  Location: [facility];  Service: Endoscopy;;    Current Outpatient Medications  Medication Sig Dispense Refill   acetaminophen (TYLENOL) 650 MG CR tablet Take 650 mg by mouth every 8 (eight) hours as needed for pain.     atorvastatin (LIPITOR) 40 MG tablet  Take 1 tablet (40 mg total) by mouth daily. 30 tablet 1   clobetasol cream (TEMOVATE) 3.81 % Apply 1 application topically 2 (two) times daily as needed (itching).     colchicine 0.6 MG tablet Take 0.6 mg by mouth 2 (two) times daily as needed (gout flare up).      diltiazem (CARDIZEM) 30 MG tablet Take 1 tablet every 4 hours AS NEEDED for AFIB heart rate >100 30 tablet 1   dronedarone (MULTAQ) 400 MG tablet Take 1 tablet (400 mg total) by mouth 2  (two) times daily with a meal. 12 tablet 0   ELIQUIS 5 MG TABS tablet TAKE 1 TABLET TWICE A DAY 180 tablet 2   febuxostat (ULORIC) 40 MG tablet Take 40 mg by mouth daily.     fluticasone (FLONASE) 50 MCG/ACT nasal spray Place 1 spray into both nostrils daily as needed for allergies or rhinitis.     Magnesium 200 MG TABS Take 800 mg by mouth daily. Taking 4 tablets by mouth daily- 800mg  total     Melatonin 10 MG CAPS Take 10 mg by mouth at bedtime.     meloxicam (MOBIC) 15 MG tablet Take 15 mg by mouth as needed for pain.      metoprolol tartrate (LOPRESSOR) 25 MG tablet TAKE 1 TABLET TWICE A DAY 180 tablet 2   pantoprazole (PROTONIX) 40 MG tablet Take 1 tablet (40 mg total) by mouth daily. 30 tablet 0   Potassium 99 MG TABS Take 99 mg by mouth every evening.     tadalafil (CIALIS) 20 MG tablet Take 20 mg by mouth daily as needed for erectile dysfunction.     testosterone cypionate (DEPOTESTOSTERONE CYPIONATE) 200 MG/ML injection Inject 200 mg into the muscle every 14 (fourteen) days.     tetrahydrozoline-zinc (VISINE-AC) 0.05-0.25 % ophthalmic solution Place 2 drops into both eyes 3 (three) times daily as needed (dry eyes).     vitamin B-12 (CYANOCOBALAMIN) 100 MCG tablet Take 100 mcg by mouth daily.     No current facility-administered medications for this encounter.    Allergies  Allergen Reactions   Penicillins Other (See Comments)    Unknown childhood   Levaquin [Levofloxacin] Palpitations    Social History   Socioeconomic History   Marital status: Married    Spouse name: Not on file   Number of children: Not on file   Years of education: Not on file   Highest education level: Not on file  Occupational History   Not on file  Tobacco Use   Smoking status: Never   Smokeless tobacco: Never  Vaping Use   Vaping Use: Never used  Substance and Sexual Activity   Alcohol use: Not Currently    Comment: occasionally   Drug use: No    Comment: reports use of CBG gummies   Sexual  activity: Not on file  Other Topics Concern   Not on file  Social History Narrative   Not on file   Social Determinants of Health   Financial Resource Strain: Not on file  Food Insecurity: Not on file  Transportation Needs: Not on file  Physical Activity: Not on file  Stress: Not on file  Social Connections: Not on file  Intimate Partner Violence: Not on file     ROS- All systems are reviewed and negative except as per the HPI above.  Physical Exam: Vitals:   12/26/20 0855  BP: 130/80  Pulse: (!) 58  Weight: (!) 142.2 kg  Height: 5'  11" (1.803 m)   GEN- The patient is a well appearing obese male, alert and oriented x 3 today.   HEENT-head normocephalic, atraumatic, sclera clear, conjunctiva pink, hearing intact, trachea midline. Lungs- Clear to ausculation bilaterally, normal work of breathing Heart- Regular rate and rhythm, no murmurs, rubs or gallops  GI- soft, NT, ND, + BS Extremities- no clubbing, cyanosis, or edema MS- no significant deformity or atrophy Skin- no rash or lesion Psych- euthymic mood, full affect Neuro- strength and sensation are intact   Wt Readings from Last 3 Encounters:  12/26/20 (!) 142.2 kg  12/12/20 136.1 kg  11/29/20 136.1 kg    EKG today demonstrates  SB Vent. rate 58 BPM PR interval 192 ms QRS duration 100 ms QT/QTcB 414/406 ms  Echo 11/29/20 demonstrated  1. Left ventricular ejection fraction, by estimation, is 55 to 60%. The  left ventricle has normal function. The left ventricle has no regional  wall motion abnormalities. Left ventricular diastolic parameters were  normal.   2. Right ventricular systolic function was not well visualized. The right  ventricular size is not well visualized.   3. The mitral valve is grossly normal. Trivial mitral valve  regurgitation.   4. The aortic valve is grossly normal. Aortic valve regurgitation is not  visualized. No aortic stenosis is present.    Epic records are reviewed at length  today  CHA2DS2-VASc Score = 4  The patient's score is based upon: CHF History: 0 HTN History: 1 Diabetes History: 1 Stroke History: 2 Vascular Disease History: 0 Age Score: 0 Gender Score: 0        ASSESSMENT AND PLAN: 1. Paroxysmal Atrial Fibrillation (ICD10:  I48.0) The patient's CHA2DS2-VASc score is 4, indicating a 4.8% annual risk of stroke.   Patient having breakthrough episodes of afib. We discussed other rhythm control options today including flecainide and ablation. Patient agreeable to consultation for ablation. Will refer to EP.  Continue Eliquis 5 mg BID Continue Lopressor 25 mg BID Continue Multaq 400 mg BID Continue diltiazem 30 mg PRN q 4 hours for heat racing.   2. Secondary Hypercoagulable State (ICD10:  D68.69) The patient is at significant risk for stroke/thromboembolism based upon his CHA2DS2-VASc Score of 4.  Continue Apixaban (Eliquis).   3. Obesity Body mass index is 43.74 kg/m. Lifestyle modification was discussed and encouraged including regular physical activity and weight reduction. Patient has lost ~80 lbs and continues to walk for about one hour daily.   4. Obstructive sleep apnea Patient reports compliance with CPAP therapy.   5. HTN Stable, no changes today.   Follow up with EP for consultation.    [facility] [address] [place], [place] [address] [phone] 12/26/2020 1:27 PM

## 2020-12-29 ENCOUNTER — Encounter (HOSPITAL_COMMUNITY): Payer: Self-pay | Admitting: Radiology

## 2021-01-02 ENCOUNTER — Other Ambulatory Visit (HOSPITAL_COMMUNITY): Payer: Self-pay | Admitting: Physician Assistant

## 2021-01-10 ENCOUNTER — Ambulatory Visit (HOSPITAL_COMMUNITY): Payer: BC Managed Care – PPO | Admitting: Hematology

## 2021-01-14 NOTE — Progress Notes (Signed)
[facility] [address],  [address]   CLINIC:  Medical Oncology/Hematology  Patient Care Team: Monico Blitz, MD as PCP - General (Internal Medicine) Stanford Breed Denice Bors, MD as PCP - Cardiology (Cardiology) Derek Jack, MD as Medical Oncologist (Hematology)  CHIEF COMPLAINTS/PURPOSE OF CONSULTATION:  Evaluation of polycythemia  HISTORY OF PRESENTING ILLNESS:  Albert [name] 50 y.o. male is here because of evaluation of polycythemia, at the request of [facility].  Today he reports feeling good. He denies headaches, vision changes, itching after shower, changing colors of the fingertips. He presents to the ED on 09/05 for an episode of syncope accompanied numbness, numbness on his right side, and generalized weakness; these symptoms resolved that day and have not recurred. He is currently taking Eliquis. He intentionally lost 115 pounds over the past 3 years. He currently works as an Optometrist, and he denies smoking history. He denies family history of polycythemia. His father had colon cancer diagnosed in his mid 49s, his mother had cervical cancer in her 36s, and his maternal grandfather had bladder cancer.    MEDICAL HISTORY:  Past Medical History:  Diagnosis Date   Atrial fibrillation ([place])    Diabetes mellitus without complication (HCC)    GERD (gastroesophageal reflux disease)    Gout    Hypertension    Low testosterone    Sleep apnea     SURGICAL HISTORY: Past Surgical History:  Procedure Laterality Date   CARDIAC CATHETERIZATION     COLONOSCOPY     COLONOSCOPY N/A 05/11/2015   Procedure: COLONOSCOPY;  Surgeon: Rogene Houston, MD;  [facility];  Service: Endoscopy;  Laterality: N/A;  930 - moved to 2/15 - Ann to notify   COLONOSCOPY WITH PROPOFOL N/A 09/07/2020   Procedure: COLONOSCOPY WITH PROPOFOL;  Surgeon: Rogene Houston, MD;  [facility];  Service: Endoscopy;  Laterality: N/A;  am    POLYPECTOMY  09/07/2020   Procedure: POLYPECTOMY;  Surgeon: Rogene Houston, MD;  [facility];  Service: Endoscopy;;    SOCIAL HISTORY: Social History   Socioeconomic History   Marital status: Married    Spouse name: Not on file   Number of children: Not on file   Years of education: Not on file   Highest education level: Not on file  Occupational History   Not on file  Tobacco Use   Smoking status: Never   Smokeless tobacco: Never  Vaping Use   Vaping Use: Never used  Substance and Sexual Activity   Alcohol use: Not Currently    Comment: occasionally   Drug use: No    Comment: reports use of CBG gummies   Sexual activity: Not on file  Other Topics Concern   Not on file  Social History Narrative   Not on file   Social Determinants of Health   Financial Resource Strain: Not on file  Food Insecurity: Not on file  Transportation Needs: Not on file  Physical Activity: Not on file  Stress: Not on file  Social Connections: Not on file  Intimate Partner Violence: Not on file    FAMILY HISTORY: Family History  Problem Relation Age of Onset   Colon cancer Father     ALLERGIES:  is allergic to penicillins and levaquin [levofloxacin].  MEDICATIONS:  Current Outpatient Medications  Medication Sig Dispense Refill   diltiazem (CARDIZEM) 30 MG tablet Take 1 tablet every 4 hours AS NEEDED for AFIB heart rate >100 30  tablet 1   ELIQUIS 5 MG TABS tablet TAKE 1 TABLET TWICE A DAY 180 tablet 2   febuxostat (ULORIC) 40 MG tablet Take 40 mg by mouth daily.     metoprolol tartrate (LOPRESSOR) 25 MG tablet TAKE 1 TABLET TWICE A DAY 180 tablet 2   MULTAQ 400 MG tablet TAKE 1 TABLET BY MOUTH 2 TIMES A DAY WITH MEALS. 60 tablet 3   pantoprazole (PROTONIX) 40 MG tablet Take 1 tablet (40 mg total) by mouth daily. 30 tablet 0   Potassium 99 MG TABS Take 99 mg by mouth every evening.     tadalafil (CIALIS) 20 MG tablet Take 20 mg by mouth daily as needed for erectile  dysfunction.     testosterone cypionate (DEPOTESTOSTERONE CYPIONATE) 200 MG/ML injection Inject 100 mg into the muscle every 14 (fourteen) days. Every 10 Days     tetrahydrozoline-zinc (VISINE-AC) 0.05-0.25 % ophthalmic solution Place 2 drops into both eyes 3 (three) times daily as needed (dry eyes).     acetaminophen (TYLENOL) 650 MG CR tablet Take 650 mg by mouth every 8 (eight) hours as needed for pain. (Patient not taking: Reported on 01/16/2021)     atorvastatin (LIPITOR) 40 MG tablet Take 1 tablet (40 mg total) by mouth daily. 30 tablet 1   clobetasol cream (TEMOVATE) 5.40 % Apply 1 application topically 2 (two) times daily as needed (itching). (Patient not taking: Reported on 01/16/2021)     colchicine 0.6 MG tablet Take 0.6 mg by mouth 2 (two) times daily as needed (gout flare up).  (Patient not taking: Reported on 01/16/2021)     fluticasone (FLONASE) 50 MCG/ACT nasal spray Place 1 spray into both nostrils daily as needed for allergies or rhinitis. (Patient not taking: Reported on 01/16/2021)     Magnesium 200 MG TABS Take 800 mg by mouth daily. Taking 4 tablets by mouth daily- 800mg  total     Melatonin 10 MG CAPS Take 10 mg by mouth at bedtime.     meloxicam (MOBIC) 15 MG tablet Take 15 mg by mouth as needed for pain.  (Patient not taking: Reported on 01/16/2021)     vitamin B-12 (CYANOCOBALAMIN) 100 MCG tablet Take 100 mcg by mouth daily.     No current facility-administered medications for this visit.    REVIEW OF SYSTEMS:   Review of Systems  Constitutional:  Negative for appetite change, fatigue and unexpected weight change.  Eyes:  Negative for eye problems.  Skin:  Negative for itching.  Neurological:  Negative for headaches.  All other systems reviewed and are negative.   PHYSICAL EXAMINATION: ECOG PERFORMANCE STATUS: 0 - Asymptomatic  Vitals:   01/16/21 1325  BP: (!) 151/95  Pulse: 67  Resp: 18  Temp: 97.9 F (36.6 C)  SpO2: 97%   Filed Weights   01/16/21 1325   Weight: (!) 305 lb 1.9 oz (138.4 kg)   Physical Exam Vitals reviewed.  Constitutional:      Appearance: Normal appearance. He is obese.  Cardiovascular:     Rate and Rhythm: Normal rate and regular rhythm.     Pulses: Normal pulses.     Heart sounds: Normal heart sounds.  Pulmonary:     Effort: Pulmonary effort is normal.     Breath sounds: Normal breath sounds.  Abdominal:     Palpations: Abdomen is soft. There is no hepatomegaly, splenomegaly or mass.     Tenderness: There is no abdominal tenderness.  Musculoskeletal:     Right lower leg:  No edema.     Left lower leg: No edema.  Lymphadenopathy:     Cervical: No cervical adenopathy.     Right cervical: No superficial cervical adenopathy.    Left cervical: No superficial cervical adenopathy.     Upper Body:     Right upper body: No supraclavicular adenopathy.     Left upper body: No supraclavicular adenopathy.  Neurological:     General: No focal deficit present.     Mental Status: He is alert and oriented to person, place, and time.  Psychiatric:        Mood and Affect: Mood normal.        Behavior: Behavior normal.     LABORATORY DATA:  I have reviewed the data as listed Recent Results (from the past 2160 hour(s))  CBG monitoring, ED     Status: Abnormal   Collection Time: 11/28/20  2:46 PM  Result Value Ref Range   Glucose-Capillary 159 (H) 70 - 99 mg/dL    Comment: Glucose reference range applies only to samples taken after fasting for at least 8 hours.  Urine rapid drug screen (hosp performed)     Status: Abnormal   Collection Time: 11/28/20  2:51 PM  Result Value Ref Range   Opiates NONE DETECTED NONE DETECTED   Cocaine NONE DETECTED NONE DETECTED   Benzodiazepines NONE DETECTED NONE DETECTED   Amphetamines NONE DETECTED NONE DETECTED   Tetrahydrocannabinol POSITIVE (A) NONE DETECTED   Barbiturates NONE DETECTED NONE DETECTED    Comment: (NOTE) DRUG SCREEN FOR MEDICAL PURPOSES ONLY.  IF CONFIRMATION IS  NEEDED FOR ANY PURPOSE, NOTIFY LAB WITHIN 5 DAYS.  LOWEST DETECTABLE LIMITS FOR URINE DRUG SCREEN Drug Class                     Cutoff (ng/mL) Amphetamine and metabolites    1000 Barbiturate and metabolites    200 Benzodiazepine                 144 Tricyclics and metabolites     300 Opiates and metabolites        300 Cocaine and metabolites        300 THC                            50 Performed at [facility], [address], [place], [place] [address]   Urinalysis, Routine w reflex microscopic Urine, Clean Catch     Status: None   Collection Time: 11/28/20  2:51 PM  Result Value Ref Range   Color, Urine YELLOW YELLOW   APPearance CLEAR CLEAR   Specific Gravity, Urine 1.020 1.005 - 1.030   pH 5.5 5.0 - 8.0   Glucose, UA NEGATIVE NEGATIVE mg/dL   Hgb urine dipstick NEGATIVE NEGATIVE   Bilirubin Urine NEGATIVE NEGATIVE   Ketones, ur NEGATIVE NEGATIVE mg/dL   Protein, ur NEGATIVE NEGATIVE mg/dL   Nitrite NEGATIVE NEGATIVE   Leukocytes,Ua NEGATIVE NEGATIVE    Comment: Microscopic not done on urines with negative protein, blood, leukocytes, nitrite, or glucose < 500 mg/dL. Performed at [facility], [address] [place], [place] [address]   Ethanol     Status: None   Collection Time: 11/28/20  3:14 PM  Result Value Ref Range   Alcohol, Ethyl (B) <10 <10 mg/dL    Comment: (NOTE) Lowest detectable limit for serum alcohol is 10 mg/dL.  For medical purposes only. Performed at [facility], [address], [place], [place] [address]   Protime-INR     Status: None   Collection Time: 11/28/20  3:14 PM  Result Value Ref Range   Prothrombin Time 14.3 11.4 - 15.2 seconds   INR 1.1 0.8 - 1.2    Comment: (NOTE) INR goal varies based on device and disease states. Performed at [facility], [address], [place], [place] [address]   APTT     Status: None   Collection Time: 11/28/20  3:14 PM  Result Value Ref Range   aPTT 33 24 - 36 seconds    Comment: Performed at [facility], [address] [place], [place] [address]  CBC     Status: Abnormal   Collection Time: 11/28/20  3:14 PM  Result Value Ref Range   WBC 8.7 4.0 - 10.5 K/uL   RBC 5.67 4.22 - 5.81 MIL/uL   Hemoglobin 18.3 (H) 13.0 - 17.0 g/dL   HCT 52.3 (H) 39.0 - 52.0 %   MCV 92.2 80.0 - 100.0 fL   MCH 32.3 26.0 - 34.0 pg   MCHC 35.0 30.0 - 36.0 g/dL   RDW 13.6 11.5 - 15.5 %   Platelets 231 150 - 400 K/uL   nRBC 0.0 0.0 - 0.2 %    Comment: Performed at [facility], [address] [place], [place] [address]  Differential     Status: None   Collection Time: 11/28/20  3:14 PM  Result Value Ref Range   Neutrophils Relative % 64 %   Neutro Abs 5.6 1.7 - 7.7 K/uL   Lymphocytes Relative 24 %   Lymphs Abs 2.1 0.7 - 4.0 K/uL   Monocytes Relative 8 %   Monocytes Absolute 0.7 0.1 - 1.0 K/uL   Eosinophils Relative 3 %   Eosinophils Absolute 0.3 0.0 - 0.5 K/uL   Basophils Relative 1 %   Basophils Absolute 0.1 0.0 - 0.1 K/uL   Immature Granulocytes 0 %   Abs Immature Granulocytes 0.03 0.00 - 0.07 K/uL    Comment: Performed at [facility], [address] [place], [place] [address]  Comprehensive metabolic panel     Status: Abnormal   Collection Time: 11/28/20  3:14 PM  Result Value Ref Range   Sodium 137 135 - 145 mmol/L   Potassium 4.0 3.5 - 5.1 mmol/L   Chloride 106 98 - 111 mmol/L   CO2 25 22 - 32 mmol/L   Glucose, Bld 145 (H) 70 - 99 mg/dL    Comment: Glucose reference range applies only to samples taken after fasting for at least 8 hours.   BUN 26 (H) 6 - 20 mg/dL   Creatinine, Ser 1.75 (H) 0.61 - 1.24 mg/dL   Calcium 8.7 (L) 8.9 - 10.3 mg/dL   Total Protein 7.6 6.5 - 8.1 g/dL   Albumin 4.5 3.5 - 5.0 g/dL   AST 34 15 - 41 U/L   ALT 36 0 - 44 U/L   Alkaline Phosphatase 42 38 - 126 U/L   Total Bilirubin 0.4 0.3 - 1.2 mg/dL   GFR, Estimated 47 (L) >60 mL/min    Comment: (NOTE) Calculated using the CKD-EPI Creatinine Equation (2021)    Anion gap 6 5 - 15    Comment: Performed at [facility], [address], [place],  [address]  Resp Panel by RT-PCR (Flu A&B, Covid) Nasopharyngeal Swab     Status: None   Collection Time: 11/28/20  3:24 PM   Specimen: Nasopharyngeal Swab; Nasopharyngeal(NP)  swabs in vial transport medium  Result Value Ref Range   SARS Coronavirus 2 by RT PCR NEGATIVE NEGATIVE    Comment: (NOTE) SARS-CoV-2 target nucleic acids are NOT DETECTED.  The SARS-CoV-2 RNA is generally detectable in upper respiratory specimens during the acute phase of infection. The lowest concentration of SARS-CoV-2 viral copies this assay can detect is 138 copies/mL. A negative result does not preclude SARS-Cov-2 infection and should not be used as the sole basis for treatment or other patient management decisions. A negative result may occur with  improper specimen collection/handling, submission of specimen other than nasopharyngeal swab, presence of viral mutation(s) within the areas targeted by this assay, and inadequate number of viral copies(<138 copies/mL). A negative result must be combined with clinical observations, patient history, and epidemiological information. The expected result is Negative.  Fact Sheet for Patients:  EntrepreneurPulse.com.au  Fact Sheet for Healthcare Providers:  IncredibleEmployment.be  This test is no t yet approved or cleared by the [place] FDA and  has been authorized for detection and/or diagnosis of SARS-CoV-2 by FDA under an Emergency Use Authorization (EUA). This EUA will remain  in effect (meaning this test can be used) for the duration of the COVID-19 declaration under Section 564(b)(1) of the Act, 21 U.S.C.section 360bbb-3(b)(1), unless the authorization is terminated  or revoked sooner.       Influenza A by PCR NEGATIVE NEGATIVE   Influenza B by PCR NEGATIVE NEGATIVE    Comment: (NOTE) The Xpert Xpress SARS-CoV-2/FLU/RSV plus assay is intended as an aid in the diagnosis of  influenza from Nasopharyngeal swab specimens and should not be used as a sole basis for treatment. Nasal washings and aspirates are unacceptable for Xpert Xpress SARS-CoV-2/FLU/RSV testing.  Fact Sheet for Patients: EntrepreneurPulse.com.au  Fact Sheet for Healthcare Providers: IncredibleEmployment.be  This test is not yet approved or cleared by the [place] FDA and has been authorized for detection and/or diagnosis of SARS-CoV-2 by FDA under an Emergency Use Authorization (EUA). This EUA will remain in effect (meaning this test can be used) for the duration of the COVID-19 declaration under Section 564(b)(1) of the Act, 21 U.S.C. section 360bbb-3(b)(1), unless the authorization is terminated or revoked.  Performed at [facility], [address], [place], [place] [address]   Hemoglobin A1c     Status: None   Collection Time: 11/29/20  1:24 AM  Result Value Ref Range   Hgb A1c MFr Bld 5.5 4.8 - 5.6 %    Comment: (NOTE) Pre diabetes:          5.7%-6.4%  Diabetes:              >6.4%  Glycemic control for   <7.0% adults with diabetes    Mean Plasma Glucose 111.15 mg/dL    Comment: Performed at [facility] [address] [place], [place] [address]  Lipid panel     Status: Abnormal   Collection Time: 11/29/20  1:24 AM  Result Value Ref Range   Cholesterol 148 0 - 200 mg/dL   Triglycerides 63 <150 mg/dL   HDL 35 (L) >40 mg/dL   Total CHOL/HDL Ratio 4.2 RATIO   VLDL 13 0 - 40 mg/dL   LDL Cholesterol 100 (H) 0 - 99 mg/dL    Comment:        Total Cholesterol/HDL:CHD Risk Coronary Heart Disease Risk Table                     Men  Women  1/2 Average Risk   3.4   3.3  Average Risk       5.0   4.4  2 X Average Risk   9.6   7.1  3 X Average Risk  23.4   11.0        Use the calculated Patient Ratio above and the CHD Risk Table to determine the patient's CHD Risk.        ATP III CLASSIFICATION (LDL):  <100     mg/dL    Optimal  100-129  mg/dL   Near or Above                    Optimal  130-159  mg/dL   Borderline  160-189  mg/dL   High  >190     mg/dL   Very High Performed at [facility] [address], [place], [place] [address]   ECHOCARDIOGRAM COMPLETE     Status: None   Collection Time: 11/29/20 12:45 PM  Result Value Ref Range   Weight 4,800 oz   Height 71 in   BP 137/83 mmHg   Single Plane A4C EF 64.8 %   S' Lateral 3.10 cm   AR max vel 2.82 cm2   AV Area VTI 2.87 cm2   AV Mean grad 4.0 mmHg   AV Peak grad 8.5 mmHg   Ao pk vel 1.46 m/s   Area-P 1/2 3.33 cm2   AV Area mean vel 2.71 cm2  CBC with Differential/Platelet     Status: Abnormal   Collection Time: 12/12/20 10:54 PM  Result Value Ref Range   WBC 10.5 4.0 - 10.5 K/uL   RBC 6.07 (H) 4.22 - 5.81 MIL/uL   Hemoglobin 19.3 (H) 13.0 - 17.0 g/dL   HCT 55.5 (H) 39.0 - 52.0 %   MCV 91.4 80.0 - 100.0 fL   MCH 31.8 26.0 - 34.0 pg   MCHC 34.8 30.0 - 36.0 g/dL   RDW 13.4 11.5 - 15.5 %   Platelets 256 150 - 400 K/uL   nRBC 0.0 0.0 - 0.2 %   Neutrophils Relative % 59 %   Neutro Abs 6.3 1.7 - 7.7 K/uL   Lymphocytes Relative 26 %   Lymphs Abs 2.7 0.7 - 4.0 K/uL   Monocytes Relative 10 %   Monocytes Absolute 1.0 0.1 - 1.0 K/uL   Eosinophils Relative 3 %   Eosinophils Absolute 0.3 0.0 - 0.5 K/uL   Basophils Relative 1 %   Basophils Absolute 0.1 0.0 - 0.1 K/uL   Immature Granulocytes 1 %   Abs Immature Granulocytes 0.07 0.00 - 0.07 K/uL    Comment: Performed at [facility], [address], [place], [place] [address]  Basic metabolic panel     Status: Abnormal   Collection Time: 12/12/20 10:54 PM  Result Value Ref Range   Sodium 138 135 - 145 mmol/L   Potassium 4.0 3.5 - 5.1 mmol/L   Chloride 106 98 - 111 mmol/L   CO2 25 22 - 32 mmol/L   Glucose, Bld 120 (H) 70 - 99 mg/dL    Comment: Glucose reference range applies only to samples taken after fasting for at least 8 hours.   BUN 36 (H) 6 - 20 mg/dL   Creatinine, Ser 1.52 (H)  0.61 - 1.24 mg/dL   Calcium 9.2 8.9 - 10.3 mg/dL   GFR, Estimated 55 (L) >60 mL/min    Comment: (NOTE) Calculated using the CKD-EPI Creatinine Equation (  2021)    Anion gap 7 5 - 15    Comment: Performed at [facility], [address] [place], [place] [address]  Troponin I (High Sensitivity)     Status: None   Collection Time: 12/12/20 10:54 PM  Result Value Ref Range   Troponin I (High Sensitivity) 9 <18 ng/L    Comment: (NOTE) Elevated high sensitivity troponin I (hsTnI) values and significant  changes across serial measurements may suggest ACS but many other  chronic and acute conditions are known to elevate hsTnI results.  Refer to the "Links" section for chest pain algorithms and additional  guidance. Performed at [facility], [address] [place], [place] [address]   Brain natriuretic peptide     Status: Abnormal   Collection Time: 12/12/20 10:54 PM  Result Value Ref Range   B Natriuretic Peptide 429.0 (H) 0.0 - 100.0 pg/mL    Comment: Performed at [facility], [address] [place], [place] [address]  Troponin I (High Sensitivity)     Status: None   Collection Time: 12/13/20 12:40 AM  Result Value Ref Range   Troponin I (High Sensitivity) 8 <18 ng/L    Comment: (NOTE) Elevated high sensitivity troponin I (hsTnI) values and significant  changes across serial measurements may suggest ACS but many other  chronic and acute conditions are known to elevate hsTnI results.  Refer to the "Links" section for chest pain algorithms and additional  guidance. Performed at [facility], [address], [place], [place] [address]   Lactate dehydrogenase     Status: None   Collection Time: 01/16/21  2:22 PM  Result Value Ref Range   LDH 151 98 - 192 U/L    Comment: Performed at [facility], [address] [place], [place] [address]  CBC with Differential     Status: Abnormal   Collection Time: 01/16/21  2:22 PM  Result Value Ref Range   WBC 8.9 4.0 - 10.5 K/uL   RBC 5.78 4.22  - 5.81 MIL/uL   Hemoglobin 18.2 (H) 13.0 - 17.0 g/dL   HCT 52.6 (H) 39.0 - 52.0 %   MCV 91.0 80.0 - 100.0 fL   MCH 31.5 26.0 - 34.0 pg   MCHC 34.6 30.0 - 36.0 g/dL   RDW 12.7 11.5 - 15.5 %   Platelets 234 150 - 400 K/uL   nRBC 0.0 0.0 - 0.2 %   Neutrophils Relative % 58 %   Neutro Abs 5.1 1.7 - 7.7 K/uL   Lymphocytes Relative 28 %   Lymphs Abs 2.5 0.7 - 4.0 K/uL   Monocytes Relative 9 %   Monocytes Absolute 0.8 0.1 - 1.0 K/uL   Eosinophils Relative 4 %   Eosinophils Absolute 0.3 0.0 - 0.5 K/uL   Basophils Relative 1 %   Basophils Absolute 0.1 0.0 - 0.1 K/uL   Immature Granulocytes 0 %   Abs Immature Granulocytes 0.04 0.00 - 0.07 K/uL    Comment: Performed at [facility], [address], [place], [place] [address]    RADIOGRAPHIC STUDIES: I have personally reviewed the radiological images as listed and agreed with the findings in the report. No results found.  ASSESSMENT:  Polycythemia: - Patient seen at the request of Dr. Louis Meckel for erythrocytosis. - He reports that he has been on testosterone supplements for over a decade. - CBC on 12/24/2020 with hemoglobin 19.8 and hematocrit 59.8. - Currently receiving testosterone 100 mg every 10 days. - Denies any aquagenic pruritus or vasomotor symptoms. -  On 11/28/2020, he was evaluated in the ER with right-sided numbness and weakness which developed after he briefly passed out for less than 30 seconds.  Hemoglobin at that time was 18.3.  He was evaluated for stroke and was thought to have a TIA.  He does not report any headaches or vision changes.  Social/family history: - He lives at home with his wife.  He is a Engineer, maintenance (IT).  Non-smoker. - No family history of polycythemia.  Father had colon cancer in his mid 13s.  Mother had cervical cancer in her 48s.  Maternal grandfather had bladder cancer.   PLAN:  Polycythemia: - He does not have any symptoms including headaches or vision changes. - Discussed causes of polycythemia.  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  However we will check for serum erythropoietin and JAK2 V6 1 7 mutation. - CBC today shows a hemoglobin 18.2 and hematocrit 52.6. - He will receive phlebotomy x2 especially given TIA around 11/28/2020. - RTC 2 to 3 weeks to discuss results.   All questions were answered. The patient knows to call the clinic with any problems, questions or concerns.  Derek Jack, MD 01/16/21 5:33 PM  [place] [phone]   I, Thana Ates, am acting as a scribe for Dr. Derek Jack.  I, Derek Jack MD, have reviewed the above documentation for accuracy and completeness, and I agree with the above.

## 2021-01-16 ENCOUNTER — Inpatient Hospital Stay (HOSPITAL_COMMUNITY): Payer: BC Managed Care – PPO

## 2021-01-16 ENCOUNTER — Other Ambulatory Visit: Payer: Self-pay

## 2021-01-16 ENCOUNTER — Inpatient Hospital Stay (HOSPITAL_COMMUNITY): Payer: BC Managed Care – PPO | Attending: Hematology | Admitting: Hematology

## 2021-01-16 ENCOUNTER — Encounter (HOSPITAL_COMMUNITY): Payer: Self-pay | Admitting: Hematology

## 2021-01-16 VITALS — BP 151/95 | HR 67 | Temp 97.9°F | Resp 18 | Wt 305.1 lb

## 2021-01-16 DIAGNOSIS — D45 Polycythemia vera: Secondary | ICD-10-CM

## 2021-01-16 DIAGNOSIS — K219 Gastro-esophageal reflux disease without esophagitis: Secondary | ICD-10-CM | POA: Insufficient documentation

## 2021-01-16 DIAGNOSIS — Z7901 Long term (current) use of anticoagulants: Secondary | ICD-10-CM | POA: Insufficient documentation

## 2021-01-16 DIAGNOSIS — Z79899 Other long term (current) drug therapy: Secondary | ICD-10-CM | POA: Insufficient documentation

## 2021-01-16 DIAGNOSIS — I4891 Unspecified atrial fibrillation: Secondary | ICD-10-CM | POA: Diagnosis not present

## 2021-01-16 DIAGNOSIS — I10 Essential (primary) hypertension: Secondary | ICD-10-CM | POA: Insufficient documentation

## 2021-01-16 DIAGNOSIS — D582 Other hemoglobinopathies: Secondary | ICD-10-CM

## 2021-01-16 DIAGNOSIS — E119 Type 2 diabetes mellitus without complications: Secondary | ICD-10-CM | POA: Insufficient documentation

## 2021-01-16 LAB — CBC WITH DIFFERENTIAL/PLATELET
Abs Immature Granulocytes: 0.04 10*3/uL (ref 0.00–0.07)
Basophils Absolute: 0.1 10*3/uL (ref 0.0–0.1)
Basophils Relative: 1 %
Eosinophils Absolute: 0.3 10*3/uL (ref 0.0–0.5)
Eosinophils Relative: 4 %
HCT: 52.6 % — ABNORMAL HIGH (ref 39.0–52.0)
Hemoglobin: 18.2 g/dL — ABNORMAL HIGH (ref 13.0–17.0)
Immature Granulocytes: 0 %
Lymphocytes Relative: 28 %
Lymphs Abs: 2.5 10*3/uL (ref 0.7–4.0)
MCH: 31.5 pg (ref 26.0–34.0)
MCHC: 34.6 g/dL (ref 30.0–36.0)
MCV: 91 fL (ref 80.0–100.0)
Monocytes Absolute: 0.8 10*3/uL (ref 0.1–1.0)
Monocytes Relative: 9 %
Neutro Abs: 5.1 10*3/uL (ref 1.7–7.7)
Neutrophils Relative %: 58 %
Platelets: 234 10*3/uL (ref 150–400)
RBC: 5.78 MIL/uL (ref 4.22–5.81)
RDW: 12.7 % (ref 11.5–15.5)
WBC: 8.9 10*3/uL (ref 4.0–10.5)
nRBC: 0 % (ref 0.0–0.2)

## 2021-01-16 LAB — LACTATE DEHYDROGENASE: LDH: 151 U/L (ref 98–192)

## 2021-01-16 NOTE — Patient Instructions (Addendum)
[place] at [facility] Discharge Instructions  You were seen and examined today by Dr. Delton Coombes. Dr. Delton Coombes is a hematologist, meaning that he specializes in blood disorders. Dr. Delton Coombes discussed your past medical history, family history of blood disorders/cancers, and the events that led to you being here today.  You were referred to Dr. Delton Coombes due to elevated hemoglobin. Dr. Delton Coombes has recommended additional lab work today to recheck your hemoglobin and rule-out any underlying causes that could be contributing to the rise in hemoglobin.  Dr. Delton Coombes also recommends a phlebotomy. This is removing a unit of blood to bring your hemoglobin back down to a normal level.  Follow-up as scheduled.   Thank you for choosing [place] at [facility] to provide your oncology and hematology care.  To afford each patient quality time with our provider, please arrive at least 15 minutes before your scheduled appointment time.   If you have a lab appointment with the [facility] please come in thru the Main Entrance and check in at the main information desk.  You need to re-schedule your appointment should you arrive 10 or more minutes late.  We strive to give you quality time with our providers, and arriving late affects you and other patients whose appointments are after yours.  Also, if you no show three or more times for appointments you may be dismissed from the clinic at the providers discretion.     Again, thank you for choosing [facility].  Our hope is that these requests will decrease the amount of time that you wait before being seen by our physicians.       _____________________________________________________________  Should you have questions after your visit to [facility], please contact our office at [phone] and follow the prompts.  Our office hours are 8:00 a.m. and 4:30 p.m. Monday -  Friday.  Please note that voicemails left after 4:00 p.m. may not be returned until the following business day.  We are closed weekends and major holidays.  You do have access to a nurse 24-7, just call the main number to the clinic [phone] and do not press any options, hold on the line and a nurse will answer the phone.    For prescription refill requests, have your pharmacy contact our office and allow 72 hours.    Due to Covid, you will need to wear a mask upon entering the hospital. If you do not have a mask, a mask will be given to you at the Main Entrance upon arrival. For doctor visits, patients may have 1 support person age 51 or older with them. For treatment visits, patients can not have anyone with them due to social distancing guidelines and our immunocompromised population.

## 2021-01-16 NOTE — Progress Notes (Signed)
Patient presents today for phlebotomy per MD orders. Phlebotomy procedure started at 1450 and ended at 1458. 500 cc removed.   Procedure tolerated well and without incident.  Patient was monitored for ten minutes after procedure.  No questions or concerns at this time.  Discharged ambulatory in stable condition.

## 2021-01-16 NOTE — Patient Instructions (Signed)
[place]  Discharge Instructions: Thank you for choosing [place] to provide your oncology and hematology care.  If you have a lab appointment with the [facility], please come in thru the Main Entrance and check in at the main information desk.  Wear comfortable clothing and clothing appropriate for easy access to any Portacath or PICC line.   We strive to give you quality time with your provider. You may need to reschedule your appointment if you arrive late (15 or more minutes).  Arriving late affects you and other patients whose appointments are after yours.  Also, if you miss three or more appointments without notifying the office, you may be dismissed from the clinic at the provider's discretion.      For prescription refill requests, have your pharmacy contact our office and allow 72 hours for refills to be completed.    Today you received the following chemotherapy and/or immunotherapy agents Therapeutic Phlebotomy.      To help prevent nausea and vomiting after your treatment, we encourage you to take your nausea medication as directed.  BELOW ARE SYMPTOMS THAT SHOULD BE REPORTED IMMEDIATELY: *FEVER GREATER THAN 100.4 F (38 C) OR HIGHER *CHILLS OR SWEATING *NAUSEA AND VOMITING THAT IS NOT CONTROLLED WITH YOUR NAUSEA MEDICATION *UNUSUAL SHORTNESS OF BREATH *UNUSUAL BRUISING OR BLEEDING *URINARY PROBLEMS (pain or burning when urinating, or frequent urination) *BOWEL PROBLEMS (unusual diarrhea, constipation, pain near the anus) TENDERNESS IN MOUTH AND THROAT WITH OR WITHOUT PRESENCE OF ULCERS (sore throat, sores in mouth, or a toothache) UNUSUAL RASH, SWELLING OR PAIN  UNUSUAL VAGINAL DISCHARGE OR ITCHING   Items with * indicate a potential emergency and should be followed up as soon as possible or go to the Emergency Department if any problems should occur.  Please show the CHEMOTHERAPY ALERT CARD or IMMUNOTHERAPY ALERT CARD at check-in to the  Emergency Department and triage nurse.  Should you have questions after your visit or need to cancel or reschedule your appointment, please contact [facility] [phone]  and follow the prompts.  Office hours are 8:00 a.m. to 4:30 p.m. Monday - Friday. Please note that voicemails left after 4:00 p.m. may not be returned until the following business day.  We are closed weekends and major holidays. You have access to a nurse at all times for urgent questions. Please call the main number to the clinic [phone] and follow the prompts.  For any non-urgent questions, you may also contact your provider using MyChart. We now offer e-Visits for anyone 71 and older to request care online for non-urgent symptoms. For details visit [email or web address].   Also download the MyChart app! Go to the app store, search "MyChart", open the app, select [place], and log in with your MyChart username and password.  Due to Covid, a mask is required upon entering the hospital/clinic. If you do not have a mask, one will be given to you upon arrival. For doctor visits, patients may have 1 support person aged 41 or older with them. For treatment visits, patients cannot have anyone with them due to current Covid guidelines and our immunocompromised population.

## 2021-01-17 LAB — ERYTHROPOIETIN: Erythropoietin: 25.7 m[IU]/mL — ABNORMAL HIGH (ref 2.6–18.5)

## 2021-01-23 ENCOUNTER — Other Ambulatory Visit: Payer: Self-pay

## 2021-01-23 ENCOUNTER — Inpatient Hospital Stay (HOSPITAL_COMMUNITY): Payer: BC Managed Care – PPO

## 2021-01-23 ENCOUNTER — Encounter (HOSPITAL_COMMUNITY): Payer: Self-pay

## 2021-01-23 NOTE — Progress Notes (Signed)
Patient presents today for phlebotomy per MD orders. Phlebotomy procedure started at 1358 and ended at 1405. Procedure tolerated well without incident. Patient monitored for 15 minutes after procedure then patient requested to leave. Patient discharged in satisfactory condition.

## 2021-01-27 ENCOUNTER — Other Ambulatory Visit: Payer: Self-pay

## 2021-01-27 ENCOUNTER — Ambulatory Visit (INDEPENDENT_AMBULATORY_CARE_PROVIDER_SITE_OTHER): Payer: BC Managed Care – PPO | Admitting: Cardiology

## 2021-01-27 ENCOUNTER — Encounter: Payer: Self-pay | Admitting: Cardiology

## 2021-01-27 VITALS — BP 136/86 | HR 71 | Ht 70.0 in | Wt 307.0 lb

## 2021-01-27 DIAGNOSIS — Z8673 Personal history of transient ischemic attack (TIA), and cerebral infarction without residual deficits: Secondary | ICD-10-CM | POA: Diagnosis not present

## 2021-01-27 DIAGNOSIS — I10 Essential (primary) hypertension: Secondary | ICD-10-CM

## 2021-01-27 DIAGNOSIS — Z9989 Dependence on other enabling machines and devices: Secondary | ICD-10-CM

## 2021-01-27 DIAGNOSIS — I480 Paroxysmal atrial fibrillation: Secondary | ICD-10-CM

## 2021-01-27 DIAGNOSIS — G4733 Obstructive sleep apnea (adult) (pediatric): Secondary | ICD-10-CM

## 2021-01-27 NOTE — Patient Instructions (Addendum)
Medication Instructions:  Your physician recommends that you continue on your current medications as directed. Please refer to the Current Medication list given to you today. *If you need a refill on your cardiac medications before your next appointment, please call your pharmacy*  Lab Work: None ordered. If you have labs (blood work) drawn today and your tests are completely normal, you will receive your results only by: [place] (if you have MyChart) OR A paper copy in the mail If you have any lab test that is abnormal or we need to change your treatment, we will call you to review the results.  Testing/Procedures: None ordered.  Follow-Up: At [facility], you and your health needs are our priority.  As part of our continuing mission to provide you with exceptional heart care, we have created designated Provider Care Teams.  These Care Teams include your primary Cardiologist (physician) and Advanced Practice Providers (APPs -  Physician Assistants and Nurse Practitioners) who all work together to provide you with the care you need, when you need it.  Your next appointment:    SEE INSTRUCTION LETTER  Cardiac Ablation Cardiac ablation is a procedure to destroy, or ablate, a small amount of heart tissue in very specific places. The heart has many electrical connections. Sometimes these connections are abnormal and can cause the heart to beat very fast or irregularly. Ablating some of the areas that cause problems can improve the heart's rhythm or return it to normal. Ablation may be done for people who: Have Wolff-Parkinson-White syndrome. Have fast heart rhythms (tachycardia). Have taken medicines for an abnormal heart rhythm (arrhythmia) that were not effective or caused side effects. Have a high-risk heartbeat that may be life-threatening. During the procedure, a small incision is made in the neck or the groin, and a long, thin tube (catheter) is inserted into the incision and  moved to the heart. Small devices (electrodes) on the tip of the catheter will send out electrical currents. A type of X-ray (fluoroscopy) will be used to help guide the catheter and to provide images of the heart. Tell a health care provider about: Any allergies you have. All medicines you are taking, including vitamins, herbs, eye drops, creams, and over-the-counter medicines. Any problems you or family members have had with anesthetic medicines. Any blood disorders you have. Any surgeries you have had. Any medical conditions you have, such as kidney failure. Whether you are pregnant or may be pregnant. What are the risks? Generally, this is a safe procedure. However, problems may occur, including: Infection. Bruising and bleeding at the catheter insertion site. Bleeding into the chest, especially into the sac that surrounds the heart. This is a serious complication. Stroke or blood clots. Damage to nearby structures or organs. Allergic reaction to medicines or dyes. Need for a permanent pacemaker if the normal electrical system is damaged. A pacemaker is a small computer that sends electrical signals to the heart and helps your heart beat normally. The procedure not being fully effective. This may not be recognized until months later. Repeat ablation procedures are sometimes done. What happens before the procedure? Medicines Ask your health care provider about: Changing or stopping your regular medicines. This is especially important if you are taking diabetes medicines or blood thinners. Taking medicines such as aspirin and ibuprofen. These medicines can thin your blood. Do not take these medicines unless your health care provider tells you to take them. Taking over-the-counter medicines, vitamins, herbs, and supplements. General instructions Follow instructions from your  health care provider about eating or drinking restrictions. Plan to have someone take you home from the hospital or  clinic. If you will be going home right after the procedure, plan to have someone with you for 24 hours. Ask your health care provider what steps will be taken to prevent infection. What happens during the procedure?  An IV will be inserted into one of your veins. You will be given a medicine to help you relax (sedative). The skin on your neck or groin will be numbed. An incision will be made in your neck or your groin. A needle will be inserted through the incision and into a large vein in your neck or groin. A catheter will be inserted into the needle and moved to your heart. Dye may be injected through the catheter to help your surgeon see the area of the heart that needs treatment. Electrical currents will be sent from the catheter to ablate heart tissue in desired areas. There are three types of energy that may be used to do this: Heat (radiofrequency energy). Laser energy. Extreme cold (cryoablation). When the tissue has been ablated, the catheter will be removed. Pressure will be held on the insertion area to prevent a lot of bleeding. A bandage (dressing) will be placed over the insertion area. The exact procedure may vary among health care providers and hospitals. What happens after the procedure? Your blood pressure, heart rate, breathing rate, and blood oxygen level will be monitored until you leave the hospital or clinic. Your insertion area will be monitored for bleeding. You will need to lie still for a few hours to ensure that you do not bleed from the insertion area. Do not drive for 24 hours or as long as told by your health care provider. Summary Cardiac ablation is a procedure to destroy, or ablate, a small amount of heart tissue using an electrical current. This procedure can improve the heart rhythm or return it to normal. Tell your health care provider about any medical conditions you may have and all medicines you are taking to treat them. This is a safe procedure,  but problems may occur. Problems may include infection, bruising, damage to nearby organs or structures, or allergic reactions to medicines. Follow your health care provider's instructions about eating and drinking before the procedure. You may also be told to change or stop some of your medicines. After the procedure, do not drive for 24 hours or as long as told by your health care provider. This information is not intended to replace advice given to you by your health care provider. Make sure you discuss any questions you have with your health care provider. Document Revised: 01/19/2019 Document Reviewed: 01/19/2019 Elsevier Patient Education  [place].

## 2021-01-27 NOTE — Progress Notes (Signed)
Electrophysiology Office Note:    Date:  01/27/2021   ID:  Albert [name], DOB 16-Apr-1970, MRN [id number]  PCP:  Monico Blitz, MD  [facility] Cardiologist:  Kirk Ruths, MD  [facility] Electrophysiologist:  Vickie Epley, MD   Referring MD: Oliver Barre, PA   Chief Complaint: Atrial fibrillation  History of Present Illness:    Albert [name] is a 50 y.o. male who presents for an evaluation of atrial fibrillation at the request of Adline Peals, PA-C. Their medical history includes obesity, diabetes, low testosterone, GERD, stroke.  The patient last saw Adline Peals in the A. fib clinic on December 26, 2020.  That appointment was in the setting of a recent emergency room visit for palpitations where he was found to be in atrial fibrillation.  He had also previously had a hospitalization in September of this year with a suspected stroke.  He said he passed out when he woke up he had right-sided numbness.  Brain imaging showed a prior CVA.  He had missed several doses of his blood thinner a few days prior to the episode which was likely the culprit.  He is symptomatic when he is in atrial fibrillation with decreased exercise tolerance.  He feels tired after the episode.  He is lost a significant amount of weight over the last few years.  (Greater than 100 pounds).  He is with his wife today in clinic.  He is interested in pursuing a rhythm control strategy.     Past Medical History:  Diagnosis Date   Atrial fibrillation ([place])    Diabetes mellitus without complication (HCC)    GERD (gastroesophageal reflux disease)    Gout    Hypertension    Low testosterone    Sleep apnea     Past Surgical History:  Procedure Laterality Date   CARDIAC CATHETERIZATION     COLONOSCOPY     COLONOSCOPY N/A 05/11/2015   Procedure: COLONOSCOPY;  Surgeon: Rogene Houston, MD;  Location: [facility] ENDO SU[facility];  Service: Endoscopy;  Laterality: N/A;  930 - moved to 2/15 - Ann to notify    COLONOSCOPY WITH PROPOFOL N/A 09/07/2020   Procedure: COLONOSCOPY WITH PROPOFOL;  Surgeon: Rogene Houston, MD;  Location: [facility] ENDO SUITE;  Service: Endoscopy;  Laterality: N/A;  am   POLYPECTOMY  09/07/2020   Procedure: POLYPECTOMY;  Surgeon: Rogene Houston, MD;  Location: [facility] ENDO SUITE;  Service: Endoscopy;;    Current Medications: Current Meds  Medication Sig   clobetasol cream (TEMOVATE) 1.74 % Apply 1 application topically 2 (two) times daily as needed (itching).   colchicine 0.6 MG tablet Take 0.6 mg by mouth 2 (two) times daily as needed (gout flare up).   diltiazem (CARDIZEM) 30 MG tablet Take 1 tablet every 4 hours AS NEEDED for AFIB heart rate >100   ELIQUIS 5 MG TABS tablet TAKE 1 TABLET TWICE A DAY   febuxostat (ULORIC) 40 MG tablet Take 40 mg by mouth daily.   fluticasone (FLONASE) 50 MCG/ACT nasal spray Place 1 spray into both nostrils daily as needed for allergies or rhinitis.   meloxicam (MOBIC) 15 MG tablet Take 15 mg by mouth as needed for pain.   metoprolol tartrate (LOPRESSOR) 25 MG tablet TAKE 1 TABLET TWICE A DAY   MULTAQ 400 MG tablet TAKE 1 TABLET BY MOUTH 2 TIMES A DAY WITH MEALS.   pantoprazole (PROTONIX) 40 MG tablet Take 1 tablet (40 mg total) by mouth daily.   Potassium  99 MG TABS Take 99 mg by mouth every evening.   tadalafil (CIALIS) 20 MG tablet Take 20 mg by mouth daily as needed for erectile dysfunction.   testosterone cypionate (DEPOTESTOSTERONE CYPIONATE) 200 MG/ML injection Inject 100 mg into the muscle every 14 (fourteen) days. Every 10 Days   tetrahydrozoline-zinc (VISINE-AC) 0.05-0.25 % ophthalmic solution Place 2 drops into both eyes 3 (three) times daily as needed (dry eyes).   vitamin B-12 (CYANOCOBALAMIN) 100 MCG tablet Take 100 mcg by mouth daily.     Allergies:   Penicillins and Levaquin [levofloxacin]   Social History   Socioeconomic History   Marital status: Married    Spouse name: Not on file   Number of children: Not on file   Years  of education: Not on file   Highest education level: Not on file  Occupational History   Not on file  Tobacco Use   Smoking status: Never   Smokeless tobacco: Never  Vaping Use   Vaping Use: Never used  Substance and Sexual Activity   Alcohol use: Not Currently    Comment: occasionally   Drug use: No    Comment: reports use of CBG gummies   Sexual activity: Not on file  Other Topics Concern   Not on file  Social History Narrative   Not on file   Social Determinants of Health   Financial Resource Strain: Not on file  Food Insecurity: Not on file  Transportation Needs: Not on file  Physical Activity: Not on file  Stress: Not on file  Social Connections: Not on file     Family History: The patient's family history includes Colon cancer in his father.  ROS:   Please see the history of present illness.    All other systems reviewed and are negative.  EKGs/Labs/Other Studies Reviewed:    The following studies were reviewed today:  November 29, 2020 echo Left ventricular function normal, 55% Trivial MR   EKG:  The ekg ordered today demonstrates sinus rhythm.  Normal intervals.   Recent Labs: 11/28/2020: ALT 36 12/12/2020: B Natriuretic Peptide 429.0; BUN 36; Creatinine, Ser 1.52; Potassium 4.0; Sodium 138 01/16/2021: Hemoglobin 18.2; Platelets 234  Recent Lipid Panel    Component Value Date/Time   CHOL 148 11/29/2020 0124   TRIG 63 11/29/2020 0124   HDL 35 (L) 11/29/2020 0124   CHOLHDL 4.2 11/29/2020 0124   VLDL 13 11/29/2020 0124   LDLCALC 100 (H) 11/29/2020 0124    Physical Exam:    VS:  BP 136/86   Pulse 71   Ht 5\' 10"  (1.778 m)   Wt (!) 307 lb (139.3 kg)   SpO2 98%   BMI 44.05 kg/m     Wt Readings from Last 3 Encounters:  01/27/21 (!) 307 lb (139.3 kg)  01/16/21 (!) 305 lb 1.9 oz (138.4 kg)  12/26/20 (!) 313 lb 9.6 oz (142.2 kg)     GEN:  Well nourished, well developed in no acute distress.  Obese HEENT: Normal NECK: No JVD; No carotid  bruits LYMPHATICS: No lymphadenopathy CARDIAC: RRR, no murmurs, rubs, gallops RESPIRATORY:  Clear to auscultation without rales, wheezing or rhonchi  ABDOMEN: Soft, non-tender, non-distended MUSCULOSKELETAL:  No edema; No deformity  SKIN: Warm and dry NEUROLOGIC:  Alert and oriented x 3 PSYCHIATRIC:  Normal affect       ASSESSMENT:    1. Paroxysmal atrial fibrillation (HCC)   2. Essential hypertension   3. History of stroke   4. OSA on CPAP  PLAN:    In order of problems listed above:  #Paroxysmal atrial fibrillation Continues to have symptomatic breakthrough episodes of atrial fibrillation despite treatment with dronedarone.  He is on Eliquis for stroke prophylaxis. We discussed alternative rhythm control strategies during today's appointment including alternative antiarrhythmic drugs such as dofetilide versus catheter ablation.  We discussed each option and its pros and cons.  He would like to avoid an alternative medication if at all possible.  We discussed the catheter ablation procedure in detail include the risk, recovery and likelihood of success.  We discussed the link between obesity, sleep apnea and successful ablation rates.  He would like to proceed with scheduling a catheter ablation. I do think he is a candidate for catheter ablation.  I would like him to continue to lose weight prior to the ablation procedure.  He will be aggressive about this between now and the date of the ablation.  Ideally he would get less than 300 pounds prior to the procedure.  He will continue Multaq uninterrupted between now and the ablation.  I will plan to continue it for at least the first 3 months after the ablation.  He will continue Eliquis uninterrupted.  We briefly discussed watchman during today's appointment.  Risk, benefits, and alternatives to EP study and radiofrequency ablation for afib were also discussed in detail today. These risks include but are not limited to stroke,  bleeding, vascular damage, tamponade, perforation, damage to the esophagus, lungs, and other structures, pulmonary vein stenosis, worsening renal function, and death. The patient understands these risk and wishes to proceed.  We will therefore proceed with catheter ablation at the next available time.  Carto, ICE, anesthesia are requested for the procedure.  Will also obtain CT PV protocol prior to the procedure to exclude LAA thrombus and further evaluate atrial anatomy.  #Hypertension Controlled.  Continue current regimen.  #History of stroke on brain imaging Continue Eliquis uninterrupted.  #Obstructive sleep apnea Discussed the link between sleep apnea treatment and successful ablation rates.      Total time spent with patient today 65 minutes. This includes reviewing records, evaluating the patient and coordinating care.  Medication Adjustments/Labs and Tests Ordered: Current medicines are reviewed at length with the patient today.  Concerns regarding medicines are outlined above.  Orders Placed This Encounter  Procedures   CT CARDIAC MORPH/PULM VEIN W/CM&W/O CA SCORE   CBC w/Diff   Basic Metabolic Panel (BMET)   EKG 12-Lead   No orders of the defined types were placed in this encounter.    Signed, Hilton Cork. Quentin Ore, MD, [facility], [facility] 01/27/2021 6:08 PM    Electrophysiology [facility]

## 2021-01-31 LAB — CALR + JAK2 E12-15 + MPL (REFLEXED)

## 2021-01-31 LAB — JAK2 V617F, W REFLEX TO CALR/E12/MPL

## 2021-02-06 NOTE — Progress Notes (Signed)
[place], [place] [address]   CLINIC:  Medical Oncology/Hematology  PCP:  Monico Blitz, [place] [address] [phone]   REASON FOR VISIT:  Follow-up for secondary polycythemia  CURRENT THERAPY: Intermittent phlebotomy  INTERVAL HISTORY:  Albert [name] 50 y.o. 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  He was seen for initial consultation by Dr. Delton Coombes on 01/16/2021.  He received therapeutic phlebotomy on 01/16/2021 and 01/23/2021.  At today's visit, he reports feeling fairly well.  No recent hospitalizations, surgeries, or changes in baseline health status.  He currently takes testosterone 100 mg every 10 days, which is half of his previous dose.  He reports slightly decreased stamina since his testosterone was reduced, but overall he reports good energy.  He tolerated phlebotomy well.  He does also report obstructive sleep apnea, reports that he is compliant with his CPAP machine.  He has not had any recurrent strokelike symptoms since his TIA in September 2022.  No history of DVT or PE, and no current signs or symptoms of blood clots.  He denies any aquagenic pruritus, Raynaud's phenomenon, or erythromelalgia.  No vasomotor symptoms such as dizziness, tinnitus, blurry vision, or peripheral neuropathy.  No B symptoms such as fever, chills, night sweats, unintentional weight loss.  He remains on Eliquis for atrial fibrillation, but denies any adverse bleeding events such as hematemesis, hematochezia, melena, or epistaxis.  He has 100% energy and 100% appetite. He has intentionally lost about 115 pounds over the past 3 years.   REVIEW OF SYSTEMS:  Review of Systems  Constitutional:  Negative for appetite change, chills, diaphoresis, fatigue, fever and unexpected weight change.  HENT:   Negative for lump/mass and nosebleeds.   Eyes:  Negative for eye problems.   Respiratory:  Negative for cough, hemoptysis and shortness of breath.   Cardiovascular:  Negative for chest pain, leg swelling and palpitations.  Gastrointestinal:  Negative for abdominal pain, blood in stool, constipation, diarrhea, nausea and vomiting.  Genitourinary:  Negative for hematuria.   Skin: Negative.   Neurological:  Negative for dizziness, headaches and light-headedness.  Hematological:  Does not bruise/bleed easily.     PAST MEDICAL/SURGICAL HISTORY:  Past Medical History:  Diagnosis Date   Atrial fibrillation ([facility])    Diabetes mellitus without complication (HCC)    GERD (gastroesophageal reflux disease)    Gout    Hypertension    Low testosterone    Sleep apnea    Past Surgical History:  Procedure Laterality Date   CARDIAC CATHETERIZATION     COLONOSCOPY     COLONOSCOPY N/A 05/11/2015   Procedure: COLONOSCOPY;  Surgeon: Rogene Houston, MD;  [facility];  Service: Endoscopy;  Laterality: N/A;  930 - moved to 2/15 - Ann to notify   COLONOSCOPY WITH PROPOFOL N/A 09/07/2020   Procedure: COLONOSCOPY WITH PROPOFOL;  Surgeon: Rogene Houston, MD;  Location: [facility];  Service: Endoscopy;  Laterality: N/A;  am   POLYPECTOMY  09/07/2020   Procedure: POLYPECTOMY;  Surgeon: Rogene Houston, MD;  Location: [facility];  Service: Endoscopy;;     SOCIAL HISTORY:  Social History   Socioeconomic History   Marital status: Married    Spouse name: Not on file   Number of children: Not on file   Years of education: Not on file   Highest education level: Not on file  Occupational History   Not on  file  Tobacco Use   Smoking status: Never   Smokeless tobacco: Never  Vaping Use   Vaping Use: Never used  Substance and Sexual Activity   Alcohol use: Not Currently    Comment: occasionally   Drug use: No    Comment: reports use of CBG gummies   Sexual activity: Not on file  Other Topics Concern   Not on file  Social History Narrative   Not on file    Social Determinants of Health   Financial Resource Strain: Not on file  Food Insecurity: Not on file  Transportation Needs: Not on file  Physical Activity: Not on file  Stress: Not on file  Social Connections: Not on file  Intimate Partner Violence: Not on file    FAMILY HISTORY:  Family History  Problem Relation Age of Onset   Colon cancer Father     CURRENT MEDICATIONS:  Outpatient Encounter Medications as of 02/07/2021  Medication Sig   acetaminophen (TYLENOL) 650 MG CR tablet Take 650 mg by mouth every 8 (eight) hours as needed for pain. (Patient not taking: Reported on 01/27/2021)   atorvastatin (LIPITOR) 40 MG tablet Take 1 tablet (40 mg total) by mouth daily. (Patient not taking: Reported on 01/27/2021)   clobetasol cream (TEMOVATE) 7.98 % Apply 1 application topically 2 (two) times daily as needed (itching).   colchicine 0.6 MG tablet Take 0.6 mg by mouth 2 (two) times daily as needed (gout flare up).   diltiazem (CARDIZEM) 30 MG tablet Take 1 tablet every 4 hours AS NEEDED for AFIB heart rate >100   ELIQUIS 5 MG TABS tablet TAKE 1 TABLET TWICE A DAY   febuxostat (ULORIC) 40 MG tablet Take 40 mg by mouth daily.   fluticasone (FLONASE) 50 MCG/ACT nasal spray Place 1 spray into both nostrils daily as needed for allergies or rhinitis.   Magnesium 200 MG TABS Take 800 mg by mouth daily. Taking 4 tablets by mouth daily- 800mg  total (Patient not taking: Reported on 01/27/2021)   Melatonin 10 MG CAPS Take 10 mg by mouth at bedtime. (Patient not taking: Reported on 01/27/2021)   meloxicam (MOBIC) 15 MG tablet Take 15 mg by mouth as needed for pain.   metoprolol tartrate (LOPRESSOR) 25 MG tablet TAKE 1 TABLET TWICE A DAY   MULTAQ 400 MG tablet TAKE 1 TABLET BY MOUTH 2 TIMES A DAY WITH MEALS.   pantoprazole (PROTONIX) 40 MG tablet Take 1 tablet (40 mg total) by mouth daily.   Potassium 99 MG TABS Take 99 mg by mouth every evening.   tadalafil (CIALIS) 20 MG tablet Take 20 mg by mouth  daily as needed for erectile dysfunction.   testosterone cypionate (DEPOTESTOSTERONE CYPIONATE) 200 MG/ML injection Inject 100 mg into the muscle every 14 (fourteen) days. Every 10 Days   tetrahydrozoline-zinc (VISINE-AC) 0.05-0.25 % ophthalmic solution Place 2 drops into both eyes 3 (three) times daily as needed (dry eyes).   vitamin B-12 (CYANOCOBALAMIN) 100 MCG tablet Take 100 mcg by mouth daily.   No facility-administered encounter medications on file as of 02/07/2021.    ALLERGIES:  Allergies  Allergen Reactions   Penicillins Other (See Comments)    Unknown childhood   Levaquin [Levofloxacin] Palpitations     PHYSICAL EXAM:  ECOG PERFORMANCE STATUS: 0 - Asymptomatic  There were no vitals filed for this visit. There were no vitals filed for this visit. Physical Exam Constitutional:      Appearance: Normal appearance. He is well-groomed. He is morbidly obese.  HENT:     Head: Normocephalic and atraumatic.     Mouth/Throat:     Mouth: Mucous membranes are moist.  Eyes:     Extraocular Movements: Extraocular movements intact.     Pupils: Pupils are equal, round, and reactive to light.  Cardiovascular:     Rate and Rhythm: Normal rate and regular rhythm.     Pulses: Normal pulses.     Heart sounds: Normal heart sounds.  Pulmonary:     Effort: Pulmonary effort is normal.     Breath sounds: Normal breath sounds.  Abdominal:     General: Bowel sounds are normal.     Palpations: Abdomen is soft.     Tenderness: There is no abdominal tenderness.  Musculoskeletal:        General: No swelling.     Right lower leg: No edema.     Left lower leg: No edema.  Lymphadenopathy:     Cervical: No cervical adenopathy.  Skin:    General: Skin is warm and dry.  Neurological:     General: No focal deficit present.     Mental Status: He is alert and oriented to person, place, and time.  Psychiatric:        Mood and Affect: Mood normal.        Behavior: Behavior normal.      LABORATORY DATA:  I have reviewed the labs as listed.  CBC    Component Value Date/Time   WBC 8.9 01/16/2021 1422   RBC 5.78 01/16/2021 1422   HGB 18.2 (H) 01/16/2021 1422   HCT 52.6 (H) 01/16/2021 1422   PLT 234 01/16/2021 1422   MCV 91.0 01/16/2021 1422   MCH 31.5 01/16/2021 1422   MCHC 34.6 01/16/2021 1422   RDW 12.7 01/16/2021 1422   LYMPHSABS 2.5 01/16/2021 1422   MONOABS 0.8 01/16/2021 1422   EOSABS 0.3 01/16/2021 1422   BASOSABS 0.1 01/16/2021 1422   CMP Latest Ref Rng & Units 12/12/2020 11/28/2020 07/25/2020  Glucose 70 - 99 mg/dL 120(H) 145(H) 103(H)  BUN 6 - 20 mg/dL 36(H) 26(H) 37(H)  Creatinine 0.61 - 1.24 mg/dL 1.52(H) 1.75(H) 2.06(H)  Sodium 135 - 145 mmol/L 138 137 137  Potassium 3.5 - 5.1 mmol/L 4.0 4.0 4.7  Chloride 98 - 111 mmol/L 106 106 104  CO2 22 - 32 mmol/L 25 25 26   Calcium 8.9 - 10.3 mg/dL 9.2 8.7(L) 9.3  Total Protein 6.5 - 8.1 g/dL - 7.6 -  Total Bilirubin 0.3 - 1.2 mg/dL - 0.4 -  Alkaline Phos 38 - 126 U/L - 42 -  AST 15 - 41 U/L - 34 -  ALT 0 - 44 U/L - 36 -    DIAGNOSTIC IMAGING:  I have independently reviewed the relevant imaging and discussed with the patient.  ASSESSMENT & PLAN: 1.  Secondary polycythemia - Patient seen at the request of Dr. Louis Meckel for erythrocytosis. - He reports that he has been on testosterone supplements for over a decade.  Currently receiving testosterone 100 mg every 10 days. -Patient has OSA, is compliant with CPAP. - CBC on 12/24/2020 with hemoglobin 19.8 and hematocrit 59.8. - On 11/28/2020, he was evaluated in the ER with right-sided numbness and weakness which developed after he briefly passed out for less than 30 seconds.  Hemoglobin at that time was 18.3.  He was evaluated for stroke and was thought to have a TIA.  He does not report any headaches or vision changes. - Work-up was negative for  any mutation of JAK2 V617F, CALR, Exon 12, MPL.  Erythropoietin elevated at 25.7.  LDH normal. - He is on Eliquis due  to atrial fibrillation. - Received phlebotomy x2 on 01/16/2021 and 01/23/2021 - Currently asymptomatic.  No aquagenic pruritus, vasomotor symptoms, or erythromelalgia.  No recurrent strokelike symptoms.   - CBC today (02/07/2021): Hgb 16.8/hematocrit 48.0 - Differential diagnosis favors secondary polycythemia in the setting of testosterone supplementation.  Patient would likely benefit from intermittent phlebotomy, especially given TIA around 11/28/2020. - PLAN: CBC and possible phlebotomy (if HCT  > 50.0) every 6 weeks. - We will check renal ultrasound due to mildly elevated erythropoietin to rule out any erythropoietin producing tumors. - RTC in 3 months for labs and follow-up.  2.  Social/family history: - He lives at home with his wife.  He is a Engineer, maintenance (IT).  Non-smoker. - No family history of polycythemia.  Father had colon cancer in his mid 65s.  Mother had cervical cancer in her 41s.  Maternal grandfather had bladder cancer.   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 The patient knows to call the clinic with any problems, questions or concerns.  Medical decision making: Low  Time spent on visit: I spent 15 minutes counseling the patient face to face. The total time spent in the appointment was 25 minutes and more than 50% was on counseling.   Harriett Rush, PA-C  02/07/2021 10:05 AM

## 2021-02-07 ENCOUNTER — Encounter (HOSPITAL_COMMUNITY): Payer: Self-pay | Admitting: Physician Assistant

## 2021-02-07 ENCOUNTER — Inpatient Hospital Stay (HOSPITAL_COMMUNITY): Payer: BC Managed Care – PPO | Attending: Hematology

## 2021-02-07 ENCOUNTER — Other Ambulatory Visit: Payer: Self-pay

## 2021-02-07 ENCOUNTER — Inpatient Hospital Stay (HOSPITAL_BASED_OUTPATIENT_CLINIC_OR_DEPARTMENT_OTHER): Payer: BC Managed Care – PPO | Admitting: Physician Assistant

## 2021-02-07 VITALS — BP 116/69 | HR 64 | Temp 98.6°F | Resp 18 | Wt 304.2 lb

## 2021-02-07 DIAGNOSIS — I4891 Unspecified atrial fibrillation: Secondary | ICD-10-CM | POA: Diagnosis not present

## 2021-02-07 DIAGNOSIS — Z8673 Personal history of transient ischemic attack (TIA), and cerebral infarction without residual deficits: Secondary | ICD-10-CM | POA: Insufficient documentation

## 2021-02-07 DIAGNOSIS — I10 Essential (primary) hypertension: Secondary | ICD-10-CM | POA: Insufficient documentation

## 2021-02-07 DIAGNOSIS — K219 Gastro-esophageal reflux disease without esophagitis: Secondary | ICD-10-CM | POA: Insufficient documentation

## 2021-02-07 DIAGNOSIS — D751 Secondary polycythemia: Secondary | ICD-10-CM

## 2021-02-07 DIAGNOSIS — E119 Type 2 diabetes mellitus without complications: Secondary | ICD-10-CM | POA: Diagnosis not present

## 2021-02-07 DIAGNOSIS — M109 Gout, unspecified: Secondary | ICD-10-CM | POA: Insufficient documentation

## 2021-02-07 DIAGNOSIS — D582 Other hemoglobinopathies: Secondary | ICD-10-CM

## 2021-02-07 DIAGNOSIS — R718 Other abnormality of red blood cells: Secondary | ICD-10-CM | POA: Diagnosis not present

## 2021-02-07 DIAGNOSIS — Z7901 Long term (current) use of anticoagulants: Secondary | ICD-10-CM | POA: Insufficient documentation

## 2021-02-07 DIAGNOSIS — Z79899 Other long term (current) drug therapy: Secondary | ICD-10-CM | POA: Insufficient documentation

## 2021-02-07 HISTORY — DX: Secondary polycythemia: D75.1

## 2021-02-07 LAB — CBC WITH DIFFERENTIAL/PLATELET
Abs Immature Granulocytes: 0.02 10*3/uL (ref 0.00–0.07)
Basophils Absolute: 0.1 10*3/uL (ref 0.0–0.1)
Basophils Relative: 1 %
Eosinophils Absolute: 0.4 10*3/uL (ref 0.0–0.5)
Eosinophils Relative: 5 %
HCT: 48 % (ref 39.0–52.0)
Hemoglobin: 16.8 g/dL (ref 13.0–17.0)
Immature Granulocytes: 0 %
Lymphocytes Relative: 25 %
Lymphs Abs: 1.8 10*3/uL (ref 0.7–4.0)
MCH: 32.1 pg (ref 26.0–34.0)
MCHC: 35 g/dL (ref 30.0–36.0)
MCV: 91.8 fL (ref 80.0–100.0)
Monocytes Absolute: 0.7 10*3/uL (ref 0.1–1.0)
Monocytes Relative: 10 %
Neutro Abs: 4.4 10*3/uL (ref 1.7–7.7)
Neutrophils Relative %: 59 %
Platelets: 232 10*3/uL (ref 150–400)
RBC: 5.23 MIL/uL (ref 4.22–5.81)
RDW: 13.3 % (ref 11.5–15.5)
WBC: 7.4 10*3/uL (ref 4.0–10.5)
nRBC: 0 % (ref 0.0–0.2)

## 2021-02-07 NOTE — Patient Instructions (Signed)
[place] [facility] Discharge Instructions  You were seen today by Tarri Abernethy PA-C for your elevated hemoglobin/elevated hematocrit.  This is called "secondary polycythemia," which means that your blood counts are elevated due to a separate cause.  In your case, we suspect that the cause of your elevated blood counts is your testosterone supplementation.  Your elevated blood counts can place you at increased risk for heart attack, stroke, and blood clots.  Since you have already had a TIA ("mini stroke") in the past, we recommend that we proceed with labs and phlebotomy every 6 weeks as needed (if hematocrit greater than 50) to maintain a safer amount of hemoglobin in your blood.  We will also check an ultrasound of your kidneys to make sure that you do not have any masses on your kidneys that would also potentially cause increased hemoglobin/hematocrit.  LABS: Return in 6 weeks for repeat blood count and possible phlebotomy.  TREATMENT: Phlebotomy as needed every 6 weeks if hematocrit greater than 50.0  FOLLOW-UP APPOINTMENT: Office visit in 3 months   Thank you for choosing [place] at [facility] to provide your oncology and hematology care.  To afford each patient quality time with our provider, please arrive at least 15 minutes before your scheduled appointment time.   If you have a lab appointment with the [facility] please come in thru the Main Entrance and check in at the main information desk.  You need to re-schedule your appointment should you arrive 10 or more minutes late.  We strive to give you quality time with our providers, and arriving late affects you and other patients whose appointments are after yours.  Also, if you no show three or more times for appointments you may be dismissed from the clinic at the providers discretion.     Again, thank you for choosing [facility].  Our hope is that these  requests will decrease the amount of time that you wait before being seen by our physicians.       _____________________________________________________________  Should you have questions after your visit to [facility], please contact our office at [phone] and follow the prompts.  Our office hours are 8:00 a.m. and 4:30 p.m. Monday - Friday.  Please note that voicemails left after 4:00 p.m. may not be returned until the following business day.  We are closed weekends and major holidays.  You do have access to a nurse 24-7, just call the main number to the clinic [phone] and do not press any options, hold on the line and a nurse will answer the phone.    For prescription refill requests, have your pharmacy contact our office and allow 72 hours.    Due to Covid, you will need to wear a mask upon entering the hospital. If you do not have a mask, a mask will be given to you at the Main Entrance upon arrival. For doctor visits, patients may have 1 support person age 53 or older with them. For treatment visits, patients can not have anyone with them due to social distancing guidelines and our immunocompromised population.

## 2021-02-14 ENCOUNTER — Ambulatory Visit (HOSPITAL_COMMUNITY): Payer: BC Managed Care – PPO

## 2021-02-22 ENCOUNTER — Encounter: Payer: Self-pay | Admitting: Adult Health

## 2021-02-22 ENCOUNTER — Ambulatory Visit (INDEPENDENT_AMBULATORY_CARE_PROVIDER_SITE_OTHER): Payer: BC Managed Care – PPO | Admitting: Adult Health

## 2021-02-22 VITALS — BP 111/69 | HR 64 | Ht 71.0 in | Wt 306.0 lb

## 2021-02-22 DIAGNOSIS — E237 Disorder of pituitary gland, unspecified: Secondary | ICD-10-CM | POA: Diagnosis not present

## 2021-02-22 DIAGNOSIS — R2990 Unspecified symptoms and signs involving the nervous system: Secondary | ICD-10-CM | POA: Diagnosis not present

## 2021-02-22 MED ORDER — ROSUVASTATIN CALCIUM 20 MG PO TABS: 20.0000 mg | ORAL_TABLET | Freq: Every day | ORAL | 5 refills | Status: DC

## 2021-02-22 NOTE — Patient Instructions (Signed)
Continue Eliquis (apixaban) daily  and start Crestor 20mg  daily  for secondary stroke prevention  Continue to follow up with PCP regarding cholesterol, blood pressure and diabetes management  Maintain strict control of hypertension with blood pressure goal below 130/90, diabetes with hemoglobin A1c goal below 7.0% and cholesterol with LDL cholesterol (bad cholesterol) goal below 70 mg/dL.   We will check lab work today to look for pituitary issues - you will also be called to be scheduled for MRI focusing on your pituitary gland    Followup in the future with me in 6 months or call earlier if needed       Thank you for coming to see [place] at [facility]. I hope we have been able to provide you high quality care today.  You may receive a patient satisfaction survey over the next few weeks. We would appreciate your feedback and comments so that we may continue to improve ourselves and the health of our patients.

## 2021-02-22 NOTE — Progress Notes (Signed)
[facility] [address] [place]. [place] [address] [phone]       HOSPITAL FOLLOW UP NOTE  Mr. Albert [name] Date of Birth:  Feb 03, 1971 Medical Record Number:  [id number]   Reason for Referral:  hospital stroke follow up    SUBJECTIVE:   CHIEF COMPLAINT:  Chief Complaint  Patient presents with   Cerebrovascular Accident    Rm 2, Hospital FU  "concerned about findings in MRI re: pituitary thickening, older stroke; I have no deficits"    HPI:   Albert [name] is a 50 y.o. male with history of A. fib on Eliquis, hypertension, diabetes, OSA on CPAP, morbid obesity, and ocular migraine who presented on 11/28/2021 with episode of brief loss of consciousness followed by right facial numbness and right side heaviness feeling. Patient stated that he was watching TV with his wife, had a sudden onset of loss of consciousness for 10 seconds, no prodromal symptoms.  Patient wife thought patient was just falling asleep.  Patient woke up feeling right face numbness, he stood up walked around feeling right-sided heaviness but no falling or difficulty walking  Symptoms lasted 30 min then resolved. Personally reviewed hospitalization pertinent progress notes, lab work and imaging.  Evaluated by Dr. Erlinda Hong. CT no acute abnormality but questionable left subinsular white matter lacunar infarct.  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  MRA head and neck negative.  EF 55 to 60%, EEG negative for seizure.  A1c 5.5, LDL 100.  UDS THC positive.  Creatinine 1.75.  Per Dr. Erlinda Hong, etiology for symptoms unclear possibly TIA vs syncope with recurrent events of old stroke vs falling asleep briefly followed by anxiety. Of note, he apparently missed 1 dose of Eliquis prior to this event.  Recommend continuation of Eliquis 5 mg twice daily and initiated atorvastatin 40 mg daily.  PT/OT no therapy needs and discharged home.   Today,  02/22/2021, being seen for hospital follow up. Doing well since discharge. Denies new or reoccurring stroke/TIA symptoms.  He has returned back to all prior activities without difficulty.  Compliant on Eliquis 5 mg twice daily without side effects.  He has since stopped his atorvastatin due to some joint pain and what he felt to be satisfactory cholesterol levels.  Blood pressure today 111/69 - stable at home.  Continue nightly use of CPAP.  He does question MRI imaging showing density along pituitary stalk and if further evaluation is needed. He reports he did discuss this with his PCP but did not do further work-up.  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 No further concerns at this time.     PERTINENT IMAGING  Per recent hospitalization Code Stroke CT head  Subtle asymmetric hypodensity questioned within the anterior left subinsular white matter, which could reflect an age-indeterminate lacunar infarct.  MRI   1. No acute intracranial infarct or other abnormality. 2. Small remote lacunar infarcts involving the anterior left external capsule/subinsular white matter and right frontal corona radiata. 3. 3 mm nodular density along the pituitary stalk, indeterminate. Correlation with pituitary function tests suggested.  MRA Normal intracranial MRA. No large vessel occlusion, hemodynamically significant stenosis, or other acute vascular abnormality. 2. Predominant fetal type origin of the PCAs with overall diminutive vertebrobasilar system 2D Echo  EF 55-60%, No thrombus, wall motion abnormality or shunt found.   EEG: no seizure activity     ROS:  14 system review of systems performed and negative with exception of no concerns  PMH:  Past Medical History:  Diagnosis Date   Atrial fibrillation ([place])    CVA (cerebral vascular accident) ([place]) 2022   Diabetes mellitus without complication ([place])     GERD (gastroesophageal reflux disease)    Gout    Hypertension    Low testosterone    Polycythemia, 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   Sleep apnea     PSH:  Past Surgical History:  Procedure Laterality Date   CARDIAC CATHETERIZATION     COLONOSCOPY     COLONOSCOPY N/A 05/11/2015   Procedure: COLONOSCOPY;  Surgeon: Rogene Houston, MD;  [facility];  Service: Endoscopy;  Laterality: N/A;  930 - moved to 2/15 - Ann to notify   COLONOSCOPY WITH PROPOFOL N/A 09/07/2020   Procedure: COLONOSCOPY WITH PROPOFOL;  Surgeon: Rogene Houston, MD;  [facility];  Service: Endoscopy;  Laterality: N/A;  am   POLYPECTOMY  09/07/2020   Procedure: POLYPECTOMY;  Surgeon: Rogene Houston, MD;  [facility];  Service: Endoscopy;;    Social History:  Social History   Socioeconomic History   Marital status: Married    Spouse name: Nevin Bloodgood   Number of children: Not on file   Years of education: Not on file   Highest education level: Master's degree (e.g., MA, MS, MEng, MEd, MSW, MBA)  Occupational History    Comment: accounting  Tobacco Use   Smoking status: Never   Smokeless tobacco: Never  Vaping Use   Vaping Use: Never used  Substance and Sexual Activity   Alcohol use: Not Currently    Comment: occasionally   Drug use: No    Comment: reports use of CBG gummies   Sexual activity: Not on file  Other Topics Concern   Not on file  Social History Narrative   Lives with wife   Social Determinants of Health   Financial Resource Strain: Not on file  Food Insecurity: Not on file  Transportation Needs: Not on file  Physical Activity: Not on file  Stress: Not on file  Social Connections: Not on file  Intimate Partner Violence: Not on file    Family History:  Family History  Problem Relation Age of Onset   Colon cancer Father     Medications:   Current Outpatient Medications on File Prior to Visit  Medication Sig Dispense Refill   acetaminophen (TYLENOL) 650 MG  CR tablet Take 650 mg by mouth every 8 (eight) hours as needed for pain.     clobetasol cream (TEMOVATE) 8.25 % Apply 1 application topically 2 (two) times daily as needed (itching).     colchicine 0.6 MG tablet Take 0.6 mg by mouth 2 (two) times daily as needed (gout flare up).     diltiazem (CARDIZEM) 30 MG tablet Take 1 tablet every 4 hours AS NEEDED for AFIB heart rate >100 30 tablet 1   ELIQUIS 5 MG TABS tablet TAKE 1 TABLET TWICE A DAY 180 tablet 2   febuxostat (ULORIC) 40 MG tablet Take 40 mg by mouth daily.     fluticasone (FLONASE) 50 MCG/ACT nasal spray Place 1 spray into both nostrils daily as needed for allergies or rhinitis.     Magnesium 200 MG TABS Take 800 mg by mouth daily. Taking 4 tablets by mouth daily- 800mg  total     meloxicam (MOBIC) 15 MG tablet Take 15 mg by mouth as needed for pain.  metoprolol tartrate (LOPRESSOR) 25 MG tablet TAKE 1 TABLET TWICE A DAY 180 tablet 2   MULTAQ 400 MG tablet TAKE 1 TABLET BY MOUTH 2 TIMES A DAY WITH MEALS. 60 tablet 3   olopatadine (PATADAY) 0.1 % ophthalmic solution 1 drop daily.     pantoprazole (PROTONIX) 40 MG tablet Take 1 tablet (40 mg total) by mouth daily. 30 tablet 0   Potassium 99 MG TABS Take 99 mg by mouth every evening.     tadalafil (CIALIS) 20 MG tablet Take 20 mg by mouth daily as needed for erectile dysfunction.     testosterone cypionate (DEPOTESTOSTERONE CYPIONATE) 200 MG/ML injection Inject 100 mg into the muscle every 14 (fourteen) days. Every 10 Days     vitamin B-12 (CYANOCOBALAMIN) 100 MCG tablet Take 100 mcg by mouth daily.     No current facility-administered medications on file prior to visit.    Allergies:   Allergies  Allergen Reactions   Penicillins Other (See Comments)    Unknown childhood   Levaquin [Levofloxacin] Palpitations      OBJECTIVE:  Physical Exam  Vitals:   02/22/21 0900  BP: 111/69  Pulse: 64  Weight: (!) 306 lb (138.8 kg)  Height: 5\' 11"  (1.803 m)   Body mass index is  42.68 kg/m. No results found.  General: well developed, well nourished, very pleasant middle aged 32 male, seated, in no evident distress Head: head normocephalic and atraumatic.   Neck: supple with no carotid or supraclavicular bruits Cardiovascular: regular rate and rhythm, no murmurs Musculoskeletal: no deformity Skin:  no rash/petichiae Vascular:  Normal pulses all extremities   Neurologic Exam Mental Status: Awake and fully alert. Fluent speech and language. Oriented to place and time. Recent and remote memory intact. Attention span, concentration and fund of knowledge appropriate. Mood and affect appropriate.  Cranial Nerves: Fundoscopic exam reveals sharp disc margins. Pupils equal, briskly reactive to light. Extraocular movements full without nystagmus. Visual fields full to confrontation. Hearing intact. Facial sensation intact. Face, tongue, palate moves normally and symmetrically.  Motor: Normal bulk and tone. Normal strength in all tested extremity muscles Sensory.: intact to touch , pinprick , position and vibratory sensation.  Coordination: Rapid alternating movements normal in all extremities. Finger-to-nose and heel-to-shin performed accurately bilaterally. Gait and Station: Arises from chair without difficulty. Stance is normal. Gait demonstrates normal stride length and balance without use of AD. Tandem walk and heel toe without difficulty.  Reflexes: 1+ and symmetric. Toes downgoing.     NIHSS  0 Modified Rankin  0      ASSESSMENT: Albert [name] is a 50 y.o. year old male with sudden syncopal episode on 11/28/2020 lasting a few seconds after which he experienced right-sided weakness, impaired coronation and sensory deficit that lasted approximately 30 minutes possibly in setting of TIA vs syncope with recrudescence of old stroke vs falling asleep briefly followed by anxiety (per Dr. Erlinda Hong). Vascular risk factors include A. fib on Eliquis, HTN, HLD, DM, OSA on  CPAP, morbid obesity, prior strokes on imaging, and ocular migraines.      PLAN:  Strokelike episode: Continue Eliquis (apixaban) daily  and atorvastatin 40 mg daily for secondary stroke prevention.  Discussed secondary stroke prevention measures and importance of close PCP follow up for aggressive stroke risk factor management. I have gone over the pathophysiology of stroke, warning signs and symptoms, risk factors and their management in some detail with instructions to go to the closest emergency room for symptoms of concern. Pituitary  stalk nodule: Incidental finding on recent MRI.  Complete pituitary labs today. Order placed for MR brain w/wo contrast with pituitary protocol for further evaluation Atrial fibrillation: On Eliquis 5 mg twice daily.  Scheduled for catheter ablation with Dr. Quentin Ore to 1023 HTN: BP goal <130/90.  Stable on current regimen per PCP HLD: LDL goal <70. Recent LDL 100.  Start Crestor 20mg  daily  DMII: A1c goal<7.0. Recent A1c 5.5. nonpharmalogical management  OSA on CPAP: Endorses continued nightly CPAP use which was highly encouraged to continue    Follow up in 6 months or call earlier if needed   CC:  [place] provider: Dr. Leonie Man PCP: Monico Blitz, MD    I spent 59 minutes of face-to-face and non-face-to-face time with patient.  This included previsit chart review including review of recent hospitalization, lab review, study review, order entry, electronic health record documentation, patient education regarding recent stroke including etiology, secondary stroke prevention measures and importance of managing stroke risk factors, pituitary stalk nodule and further evaluation and answered all other questions to patient satisfaction   Frann Rider, AGNP-BC  [facility] [address] [place], [place] [address]  Phone [phone] Fax [phone] Note: This document was prepared with digital dictation and possible smart phrase  technology. Any transcriptional errors that result from this process are unintentional.

## 2021-02-23 ENCOUNTER — Other Ambulatory Visit: Payer: Self-pay

## 2021-02-23 ENCOUNTER — Ambulatory Visit (HOSPITAL_COMMUNITY)
Admission: RE | Admit: 2021-02-23 | Discharge: 2021-02-23 | Disposition: A | Discharge status: Home / Self Care | Payer: BC Managed Care – PPO | Source: Ambulatory Visit | Attending: Physician Assistant | Admitting: Physician Assistant

## 2021-02-23 DIAGNOSIS — R718 Other abnormality of red blood cells: Secondary | ICD-10-CM | POA: Diagnosis present

## 2021-02-23 LAB — ACTH: ACTH: 12.7 pg/mL (ref 7.2–63.3)

## 2021-02-23 LAB — INSULIN-LIKE GROWTH FACTOR: Insulin-Like GF-1: 83 ng/mL (ref 74–255)

## 2021-02-23 LAB — THYROID PANEL WITH TSH
Free Thyroxine Index: 2.6 (ref 1.2–4.9)
T3 Uptake Ratio: 30 % (ref 24–39)
T4, Total: 8.8 ug/dL (ref 4.5–12.0)
TSH: 4.13 u[IU]/mL (ref 0.450–4.500)

## 2021-02-23 LAB — TESTOSTERONE: Testosterone: 480 ng/dL (ref 264–916)

## 2021-02-23 NOTE — Progress Notes (Signed)
I agree with the above plan 

## 2021-02-24 ENCOUNTER — Other Ambulatory Visit (HOSPITAL_COMMUNITY): Payer: Self-pay | Admitting: Physician Assistant

## 2021-02-24 DIAGNOSIS — N2889 Other specified disorders of kidney and ureter: Secondary | ICD-10-CM

## 2021-03-01 LAB — FOLLICLE STIMULATING HORMONE: FSH: <0.3 m[IU]/mL — ABNORMAL LOW (ref 1.5–12.4)

## 2021-03-01 LAB — LUTEINIZING HORMONE: LH: <0.3 m[IU]/mL — ABNORMAL LOW (ref 1.7–8.6)

## 2021-03-01 LAB — SPECIMEN STATUS REPORT

## 2021-03-02 ENCOUNTER — Telehealth: Payer: Self-pay | Admitting: Adult Health

## 2021-03-02 NOTE — Telephone Encounter (Signed)
Forestine Na  Cataract auth: [id number] (exp. 03/02/21 to 03/31/21) order sent to [facility] for scheduling, they will reach out to the patient to schedule.

## 2021-03-03 ENCOUNTER — Other Ambulatory Visit: Payer: Self-pay

## 2021-03-03 ENCOUNTER — Ambulatory Visit (HOSPITAL_COMMUNITY)
Admission: RE | Admit: 2021-03-03 | Discharge: 2021-03-03 | Disposition: A | Discharge status: Home / Self Care | Payer: BC Managed Care – PPO | Source: Ambulatory Visit | Attending: Physician Assistant | Admitting: Physician Assistant

## 2021-03-03 DIAGNOSIS — N2889 Other specified disorders of kidney and ureter: Secondary | ICD-10-CM | POA: Insufficient documentation

## 2021-03-03 MED ORDER — GADOBUTROL 1 MMOL/ML IV SOLN
10.0000 mL | Freq: Once | INTRAVENOUS | Status: AC | PRN
  Administered 2021-03-03: 10 mL via INTRAVENOUS

## 2021-03-07 ENCOUNTER — Inpatient Hospital Stay (HOSPITAL_COMMUNITY): Payer: BC Managed Care – PPO

## 2021-03-07 ENCOUNTER — Inpatient Hospital Stay (HOSPITAL_COMMUNITY): Payer: BC Managed Care – PPO | Attending: Hematology

## 2021-03-07 ENCOUNTER — Other Ambulatory Visit: Payer: Self-pay

## 2021-03-07 DIAGNOSIS — D751 Secondary polycythemia: Secondary | ICD-10-CM | POA: Diagnosis not present

## 2021-03-07 LAB — CBC
HCT: 48.8 % (ref 39.0–52.0)
Hemoglobin: 17.4 g/dL — ABNORMAL HIGH (ref 13.0–17.0)
MCH: 32.7 pg (ref 26.0–34.0)
MCHC: 35.7 g/dL (ref 30.0–36.0)
MCV: 91.7 fL (ref 80.0–100.0)
Platelets: 265 10*3/uL (ref 150–400)
RBC: 5.32 MIL/uL (ref 4.22–5.81)
RDW: 13 % (ref 11.5–15.5)
WBC: 9.2 10*3/uL (ref 4.0–10.5)
nRBC: 0 % (ref 0.0–0.2)

## 2021-03-07 NOTE — Progress Notes (Signed)
Hct 48.8 today.  Patient does not need a phlebotomy per last note dictated.  No complaints voiced.  Discharged in stable condition with no complaints voiced.

## 2021-03-07 NOTE — Progress Notes (Signed)
Hematocrit is 48.8 today. Will not do phlebotomy per orders.   Follow up as scheduled.

## 2021-03-10 ENCOUNTER — Ambulatory Visit (HOSPITAL_COMMUNITY): Payer: BC Managed Care – PPO

## 2021-03-15 ENCOUNTER — Other Ambulatory Visit: Payer: Self-pay

## 2021-03-15 ENCOUNTER — Ambulatory Visit (HOSPITAL_COMMUNITY)
Admission: RE | Admit: 2021-03-15 | Discharge: 2021-03-15 | Disposition: A | Discharge status: Home / Self Care | Payer: BC Managed Care – PPO | Source: Ambulatory Visit | Attending: Adult Health | Admitting: Adult Health

## 2021-03-15 DIAGNOSIS — E237 Disorder of pituitary gland, unspecified: Secondary | ICD-10-CM | POA: Insufficient documentation

## 2021-03-15 MED ORDER — GADOBUTROL 1 MMOL/ML IV SOLN
10.0000 mL | Freq: Once | INTRAVENOUS | Status: AC | PRN
  Administered 2021-03-15: 10:00:00 10 mL via INTRAVENOUS

## 2021-04-10 ENCOUNTER — Telehealth: Payer: Self-pay | Admitting: Cardiology

## 2021-04-10 NOTE — Telephone Encounter (Signed)
New Message:     Patient wants to cx his Ablation on 05-05-21.

## 2021-04-10 NOTE — Telephone Encounter (Signed)
Left message to call back  

## 2021-04-11 ENCOUNTER — Other Ambulatory Visit: Payer: BC Managed Care – PPO

## 2021-04-18 ENCOUNTER — Ambulatory Visit (HOSPITAL_COMMUNITY): Payer: BC Managed Care – PPO | Admitting: Physician Assistant

## 2021-04-18 ENCOUNTER — Inpatient Hospital Stay (HOSPITAL_COMMUNITY): Payer: BC Managed Care – PPO

## 2021-04-18 ENCOUNTER — Encounter (HOSPITAL_COMMUNITY): Payer: BC Managed Care – PPO

## 2021-04-19 NOTE — Telephone Encounter (Signed)
Patient was returning call for [facility]

## 2021-04-19 NOTE — Telephone Encounter (Signed)
The patient is wanting to cancel his procedure because he has not lost the weight that he was hoping for prior to ablation.  Canceled procedure and prior testing.

## 2021-04-28 ENCOUNTER — Other Ambulatory Visit (HOSPITAL_COMMUNITY): Payer: Self-pay | Admitting: Physician Assistant

## 2021-04-28 ENCOUNTER — Ambulatory Visit (HOSPITAL_COMMUNITY): Payer: BC Managed Care – PPO

## 2021-05-01 ENCOUNTER — Ambulatory Visit (HOSPITAL_COMMUNITY): Payer: BC Managed Care – PPO | Admitting: Physician Assistant

## 2021-05-01 NOTE — Progress Notes (Deleted)
RESCHEDULE 

## 2021-05-02 ENCOUNTER — Encounter (HOSPITAL_COMMUNITY): Payer: Self-pay

## 2021-05-02 ENCOUNTER — Inpatient Hospital Stay (HOSPITAL_COMMUNITY): Payer: BC Managed Care – PPO

## 2021-05-02 ENCOUNTER — Inpatient Hospital Stay (HOSPITAL_COMMUNITY): Payer: BC Managed Care – PPO | Admitting: Physician Assistant

## 2021-05-05 ENCOUNTER — Ambulatory Visit (HOSPITAL_COMMUNITY): Admit: 2021-05-05 | Payer: BC Managed Care – PPO | Admitting: Cardiology

## 2021-05-05 ENCOUNTER — Encounter (HOSPITAL_COMMUNITY): Payer: BC Managed Care – PPO

## 2021-05-05 SURGERY — ATRIAL FIBRILLATION ABLATION
Anesthesia: General

## 2021-05-12 ENCOUNTER — Ambulatory Visit (HOSPITAL_COMMUNITY): Payer: BC Managed Care – PPO | Admitting: Physician Assistant

## 2021-05-16 ENCOUNTER — Ambulatory Visit (HOSPITAL_COMMUNITY)
Admission: RE | Admit: 2021-05-16 | Discharge: 2021-05-16 | Disposition: A | Discharge status: Home / Self Care | Payer: BC Managed Care – PPO | Source: Ambulatory Visit | Attending: Physician Assistant | Admitting: Physician Assistant

## 2021-05-16 ENCOUNTER — Encounter (HOSPITAL_COMMUNITY): Payer: Self-pay | Admitting: Physician Assistant

## 2021-05-16 ENCOUNTER — Other Ambulatory Visit: Payer: Self-pay

## 2021-05-16 VITALS — BP 132/84 | HR 61 | Ht 71.0 in | Wt 312.6 lb

## 2021-05-16 DIAGNOSIS — D6869 Other thrombophilia: Secondary | ICD-10-CM | POA: Insufficient documentation

## 2021-05-16 DIAGNOSIS — G4733 Obstructive sleep apnea (adult) (pediatric): Secondary | ICD-10-CM | POA: Insufficient documentation

## 2021-05-16 DIAGNOSIS — E669 Obesity, unspecified: Secondary | ICD-10-CM | POA: Insufficient documentation

## 2021-05-16 DIAGNOSIS — Z9989 Dependence on other enabling machines and devices: Secondary | ICD-10-CM | POA: Insufficient documentation

## 2021-05-16 DIAGNOSIS — Z6841 Body Mass Index (BMI) 40.0 and over, adult: Secondary | ICD-10-CM | POA: Diagnosis not present

## 2021-05-16 DIAGNOSIS — Z7901 Long term (current) use of anticoagulants: Secondary | ICD-10-CM | POA: Insufficient documentation

## 2021-05-16 DIAGNOSIS — E119 Type 2 diabetes mellitus without complications: Secondary | ICD-10-CM | POA: Diagnosis not present

## 2021-05-16 DIAGNOSIS — Z8673 Personal history of transient ischemic attack (TIA), and cerebral infarction without residual deficits: Secondary | ICD-10-CM | POA: Diagnosis not present

## 2021-05-16 DIAGNOSIS — Z79899 Other long term (current) drug therapy: Secondary | ICD-10-CM | POA: Diagnosis not present

## 2021-05-16 DIAGNOSIS — I480 Paroxysmal atrial fibrillation: Secondary | ICD-10-CM | POA: Diagnosis not present

## 2021-05-16 DIAGNOSIS — K219 Gastro-esophageal reflux disease without esophagitis: Secondary | ICD-10-CM | POA: Insufficient documentation

## 2021-05-16 DIAGNOSIS — I10 Essential (primary) hypertension: Secondary | ICD-10-CM | POA: Diagnosis not present

## 2021-05-16 NOTE — Progress Notes (Signed)
Primary Care Physician: Monico Blitz, MD Primary Cardiologist: Dr Stanford Breed Primary Electrophysiologist: Dr Quentin Ore Referring Physician: Dr Clifton James Andrews Albert [name] is a 51 y.o. male with a history of DM, HTN, GERD, OSA, and paroxysmal atrial fibrillation who presents for follow up in the [facility]. The patient was initially diagnosed with atrial fibrillation 12/25/19 after presenting to the ED with symptoms of palpitations, SOB, and some chest heaviness. He states he had brief, intermittent palpitations for two weeks prior. Patient was started on Eliquis for a CHADS2VASC score of 2. He converted back to SR spontaneously in the ED. Since then, he has had some mild palpitations almost daily. He did have another extended episode of heart racing on 10/8 which resolved when he took his BB. He is compliant with his CPAP and denies any alcohol use.   Patient was hospitalized 11/28/20 for a suspected CVA. He lost consciousness while watching TV for 10 seconds and when he woke he  had right sided numbness and limb heaviness. Imaging showed no new infarct but it did show evidence of a prior CVA. Etiology was unclear with question for TIA. Patient was also seen at the ED 12/12/20 for afib lasting 2-3 hours. He converted to SR prior to arrival.  On follow up today, patient reports that he has done well since his last visit. He cancelled his ablation as he had not met his weight loss goal and his afib was not frequent. He is increasing his physical activity again. No bleeding issues on anticoagulation.    Today, he denies symptoms of palpitations, chest pain, shortness of breath, orthopnea, PND, lower extremity edema, dizziness, presyncope, syncope, bleeding, or neurologic sequela. The patient is tolerating medications without difficulties and is otherwise without complaint today.    Atrial Fibrillation Risk Factors:  he does have symptoms or diagnosis of sleep apnea. he is  compliant with CPAP therapy. he does not have a history of rheumatic fever. he does not have a history of alcohol use. The patient does not have a history of early familial atrial fibrillation or other arrhythmias.  he has a BMI of Body mass index is 43.6 kg/m.[name] Filed Weights   05/16/21 0929  Weight: (!) 141.8 kg      Family History  Problem Relation Age of Onset   Colon cancer Father      Atrial Fibrillation Management history:  Previous antiarrhythmic drugs: Multaq Previous cardioversions: none Previous ablations: none CHADS2VASC score: 4 Anticoagulation history: Eliquis   Past Medical History:  Diagnosis Date   Atrial fibrillation ([place])    CVA (cerebral vascular accident) ([place]) 2022   Diabetes mellitus without complication ([place])    GERD (gastroesophageal reflux disease)    Gout    Hypertension    Low testosterone    Polycythemia, 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   Sleep apnea    Past Surgical History:  Procedure Laterality Date   CARDIAC CATHETERIZATION     COLONOSCOPY     COLONOSCOPY N/A 05/11/2015   Procedure: COLONOSCOPY;  Surgeon: Rogene Houston, MD;  [facility];  Service: Endoscopy;  Laterality: N/A;  930 - moved to 2/15 - Ann to notify   COLONOSCOPY WITH PROPOFOL N/A 09/07/2020   Procedure: COLONOSCOPY WITH PROPOFOL;  Surgeon: Rogene Houston, MD;  [facility];  Service: Endoscopy;  Laterality: N/A;  am   POLYPECTOMY  09/07/2020   Procedure: POLYPECTOMY;  Surgeon: Rogene Houston, MD;  [facility];  Service:  Endoscopy;;    Current Outpatient Medications  Medication Sig Dispense Refill   acetaminophen (TYLENOL) 650 MG CR tablet Take 650 mg by mouth every 8 (eight) hours as needed for pain.     clobetasol cream (TEMOVATE) 4.76 % Apply 1 application topically 2 (two) times daily as needed (itching).     colchicine 0.6 MG tablet Take 0.6 mg by mouth 2 (two) times daily as needed (gout flare up).     diltiazem (CARDIZEM) 30 MG  tablet Take 1 tablet every 4 hours AS NEEDED for AFIB heart rate >100 30 tablet 1   ELIQUIS 5 MG TABS tablet TAKE 1 TABLET TWICE A DAY 180 tablet 2   febuxostat (ULORIC) 40 MG tablet Take 40 mg by mouth daily.     fluticasone (FLONASE) 50 MCG/ACT nasal spray Place 1 spray into both nostrils daily as needed for allergies or rhinitis.     Magnesium 200 MG TABS Take 800 mg by mouth daily. Taking 4 tablets by mouth daily- 841m total     meloxicam (MOBIC) 15 MG tablet Take 15 mg by mouth as needed for pain.     metoprolol tartrate (LOPRESSOR) 25 MG tablet TAKE 1 TABLET TWICE A DAY 180 tablet 2   MULTAQ 400 MG tablet TAKE 1 TABLET BY MOUTH 2 TIMES A DAY WITH MEALS. 60 tablet 3   olopatadine (PATANOL) 0.1 % ophthalmic solution 1 drop daily.     pantoprazole (PROTONIX) 40 MG tablet Take 1 tablet (40 mg total) by mouth daily. 30 tablet 0   Potassium 99 MG TABS Take 99 mg by mouth every evening.     rosuvastatin (CRESTOR) 20 MG tablet Take 1 tablet (20 mg total) by mouth daily. 30 tablet 5   tadalafil (CIALIS) 20 MG tablet Take 20 mg by mouth daily as needed for erectile dysfunction.     testosterone cypionate (DEPOTESTOSTERONE CYPIONATE) 200 MG/ML injection Inject 100 mg into the muscle every 14 (fourteen) days. Every 10 Days     vitamin B-12 (CYANOCOBALAMIN) 100 MCG tablet Take 100 mcg by mouth daily.     No current facility-administered medications for this encounter.    Allergies  Allergen Reactions   Penicillins Other (See Comments)    Unknown childhood   Levaquin [Levofloxacin] Palpitations    Social History   Socioeconomic History   Marital status: Married    Spouse name: PNevin Bloodgood  Number of children: Not on file   Years of education: Not on file   Highest education level: Master's degree (e.g., MA, MS, MEng, MEd, MSW, MBA)  Occupational History    Comment: accounting  Tobacco Use   Smoking status: Never   Smokeless tobacco: Never  Vaping Use   Vaping Use: Never used  Substance  and Sexual Activity   Alcohol use: Not Currently    Comment: occasionally   Drug use: No    Comment: reports use of CBG gummies   Sexual activity: Not on file  Other Topics Concern   Not on file  Social History Narrative   Lives with wife   Social Determinants of Health   Financial Resource Strain: Not on file  Food Insecurity: Not on file  Transportation Needs: Not on file  Physical Activity: Not on file  Stress: Not on file  Social Connections: Not on file  Intimate Partner Violence: Not on file     ROS- All systems are reviewed and negative except as per the HPI above.  Physical Exam: Vitals:   05/16/21  0929  BP: 132/84  Pulse: 61  Weight: (!) 141.8 kg  Height: '5\' 11"'  (1.803 m)    GEN- The patient is a well appearing obese male, alert and oriented x 3 today.   HEENT-head normocephalic, atraumatic, sclera clear, conjunctiva pink, hearing intact, trachea midline. Lungs- Clear to ausculation bilaterally, normal work of breathing Heart- Regular rate and rhythm, no murmurs, rubs or gallops  GI- soft, NT, ND, + BS Extremities- no clubbing, cyanosis, or edema MS- no significant deformity or atrophy Skin- no rash or lesion Psych- euthymic mood, full affect Neuro- strength and sensation are intact   Wt Readings from Last 3 Encounters:  05/16/21 (!) 141.8 kg  02/22/21 (!) 138.8 kg  02/07/21 (!) 138 kg    EKG today demonstrates  SR Vent. rate 61 BPM PR interval 196 ms QRS duration 102 ms QT/QTcB 398/400 ms  Echo 11/29/20 demonstrated  1. Left ventricular ejection fraction, by estimation, is 55 to 60%. The  left ventricle has normal function. The left ventricle has no regional  wall motion abnormalities. Left ventricular diastolic parameters were  normal.   2. Right ventricular systolic function was not well visualized. The right ventricular size is not well visualized.   3. The mitral valve is grossly normal. Trivial mitral valve  regurgitation.   4. The  aortic valve is grossly normal. Aortic valve regurgitation is not visualized. No aortic stenosis is present.    Epic records are reviewed at length today  CHA2DS2-VASc Score = 4  The patient's score is based upon: CHF History: 0 HTN History: 1 Diabetes History: 1 Stroke History: 2 Vascular Disease History: 0 Age Score: 0 Gender Score: 0    ASSESSMENT AND PLAN: 1. Paroxysmal Atrial Fibrillation (ICD10:  I48.0) The patient's CHA2DS2-VASc score is 4, indicating a 4.8% annual risk of stroke.   Patient appears to be maintaining SR.  Continue Eliquis 5 mg BID Continue Lopressor 25 mg BID Continue Multaq 400 mg BID Continue diltiazem 30 mg PRN q 4 hours for heat racing.  We briefly discussed alternate AAD options including flecainide and dofetilide if his afib should become more persistent/frequent.   2. Secondary Hypercoagulable State (ICD10:  D68.69) The patient is at significant risk for stroke/thromboembolism based upon his CHA2DS2-VASc Score of 4.  Continue Apixaban (Eliquis).   3. Obesity Body mass index is 43.6 kg/m. Lifestyle modification was discussed and encouraged including regular physical activity and weight reduction. Patient plans to increase his walking time and resume calorie restriction. We discussed referral to [facility] if needed.   4. Obstructive sleep apnea Patient reports compliance with CPAP therapy.  5. HTN Stable, no changes today.   Follow up with Dr Quentin Ore as scheduled. AF clinic in 9 months.    [facility] [address] [place], [place] [address] [phone] 05/16/2021 9:41 AM

## 2021-05-17 NOTE — Progress Notes (Addendum)
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  He was last seen by Tarri Abernethy PA-C on 02/07/2021.  His most recent therapeutic phlebotomy was on 01/23/2021; no phlebotomy on 03/07/2021 due to hematocrit 48.8.  At today's visit, he reports feeling well.  No recent hospitalizations, surgeries, or changes in baseline health status.  He remains on testosterone 100 mg every 10 days. He continues to be compliant with his CPAP machine. No strokelike symptoms since his TIA in September 2022. NO Blood clots NO Aquagenic pruritus, Raynaud's, erythromelalgia (itching/burning pain) NO Vasomotor symptoms (dizziness, tinnitus, blurry vision, strokelike symptoms, neuropathy) NO B symptoms, abdominal pain, nausea, or early satiety  He remains on Eliquis for atrial fibrillation, but has not noted any bleeding events such as hematemesis, hematochezia, melena, or epistaxis.  He has 100% energy and 100% appetite. He endorses that he is maintaining a stable weight.   REVIEW OF SYSTEMS:  Review of Systems  Constitutional:  Negative for appetite change, chills, diaphoresis, fatigue, fever and unexpected weight change.  HENT:   Negative for lump/mass and nosebleeds.   Eyes:  Negative for eye problems.  Respiratory:  Negative for cough, hemoptysis and shortness of breath.   Cardiovascular:  Negative for chest pain, leg swelling and palpitations.  Gastrointestinal:  Negative for abdominal pain, blood in stool, constipation, diarrhea, nausea and vomiting.  Genitourinary:  Negative for  hematuria.   Skin: Negative.   Neurological:  Negative for dizziness, headaches and light-headedness.  Hematological:  Does not bruise/bleed easily.     PAST MEDICAL/SURGICAL HISTORY:  Past Medical History:  Diagnosis Date   Atrial fibrillation ([place])    CVA (cerebral vascular accident) ([place]) 2022   Diabetes mellitus without complication ([place])    GERD (gastroesophageal reflux disease)    Gout    Hypertension    Low testosterone    Polycythemia, 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   Sleep apnea    Past Surgical History:  Procedure Laterality Date   CARDIAC CATHETERIZATION     COLONOSCOPY     COLONOSCOPY N/A 05/11/2015   Procedure: COLONOSCOPY;  Surgeon: Rogene Houston, MD;  [facility];  Service: Endoscopy;  Laterality: N/A;  930 - moved to 2/15 - Ann to notify   COLONOSCOPY WITH PROPOFOL N/A 09/07/2020   Procedure: COLONOSCOPY WITH PROPOFOL;  Surgeon: Rogene Houston, MD;  Location: [facility];  Service: Endoscopy;  Laterality: N/A;  am   POLYPECTOMY  09/07/2020   Procedure: POLYPECTOMY;  Surgeon: Rogene Houston, MD;  Location: [facility];  Service: Endoscopy;;     SOCIAL HISTORY:  Social History   Socioeconomic History   Marital status: Married    Spouse name: Nevin Bloodgood   Number of children: Not on file   Years of education: Not on file   Highest education level: Master's degree (e.g., MA, MS, MEng, MEd, MSW, MBA)  Occupational History    Comment: accounting  Tobacco Use   Smoking status: Never   Smokeless tobacco: Never  Vaping Use   Vaping Use: Never used  Substance and Sexual Activity   Alcohol use: Not Currently    Comment: occasionally   Drug  use: No    Comment: reports use of CBG gummies   Sexual activity: Not on file  Other Topics Concern   Not on file  Social History Narrative   Lives with wife   Social Determinants of Health   Financial Resource Strain: Not on file  Food Insecurity: Not on file  Transportation Needs: Not on file  Physical  Activity: Not on file  Stress: Not on file  Social Connections: Not on file  Intimate Partner Violence: Not on file    FAMILY HISTORY:  Family History  Problem Relation Age of Onset   Colon cancer Father     CURRENT MEDICATIONS:  Outpatient Encounter Medications as of 05/18/2021  Medication Sig   acetaminophen (TYLENOL) 650 MG CR tablet Take 650 mg by mouth every 8 (eight) hours as needed for pain.   clobetasol cream (TEMOVATE) 6.29 % Apply 1 application topically 2 (two) times daily as needed (itching).   colchicine 0.6 MG tablet Take 0.6 mg by mouth 2 (two) times daily as needed (gout flare up).   diltiazem (CARDIZEM) 30 MG tablet Take 1 tablet every 4 hours AS NEEDED for AFIB heart rate >100   ELIQUIS 5 MG TABS tablet TAKE 1 TABLET TWICE A DAY   febuxostat (ULORIC) 40 MG tablet Take 40 mg by mouth daily.   fluticasone (FLONASE) 50 MCG/ACT nasal spray Place 1 spray into both nostrils daily as needed for allergies or rhinitis.   Magnesium 200 MG TABS Take 800 mg by mouth daily. Taking 4 tablets by mouth daily- 800mg  total   meloxicam (MOBIC) 15 MG tablet Take 15 mg by mouth as needed for pain.   metoprolol tartrate (LOPRESSOR) 25 MG tablet TAKE 1 TABLET TWICE A DAY   MULTAQ 400 MG tablet TAKE 1 TABLET BY MOUTH 2 TIMES A DAY WITH MEALS.   olopatadine (PATANOL) 0.1 % ophthalmic solution 1 drop daily.   pantoprazole (PROTONIX) 40 MG tablet Take 1 tablet (40 mg total) by mouth daily.   Potassium 99 MG TABS Take 99 mg by mouth every evening.   rosuvastatin (CRESTOR) 20 MG tablet Take 1 tablet (20 mg total) by mouth daily.   tadalafil (CIALIS) 20 MG tablet Take 20 mg by mouth daily as needed for erectile dysfunction.   testosterone cypionate (DEPOTESTOSTERONE CYPIONATE) 200 MG/ML injection Inject 100 mg into the muscle every 14 (fourteen) days. Every 10 Days   vitamin B-12 (CYANOCOBALAMIN) 100 MCG tablet Take 100 mcg by mouth daily.   No facility-administered encounter medications on file  as of 05/18/2021.    ALLERGIES:  Allergies  Allergen Reactions   Penicillins Other (See Comments)    Unknown childhood   Levaquin [Levofloxacin] Palpitations     PHYSICAL EXAM:  ECOG PERFORMANCE STATUS: 0 - Asymptomatic  There were no vitals filed for this visit. There were no vitals filed for this visit. Physical Exam Constitutional:      Appearance: Normal appearance. He is well-groomed. He is morbidly obese.  HENT:     Head: Normocephalic and atraumatic.     Mouth/Throat:     Mouth: Mucous membranes are moist.  Eyes:     Extraocular Movements: Extraocular movements intact.     Pupils: Pupils are equal, round, and reactive to light.  Cardiovascular:     Rate and Rhythm: Normal rate and regular rhythm.     Pulses: Normal pulses.     Heart sounds: Normal heart sounds.  Pulmonary:     Effort: Pulmonary effort is normal.  Breath sounds: Normal breath sounds.  Abdominal:     General: Bowel sounds are normal.     Palpations: Abdomen is soft.     Tenderness: There is no abdominal tenderness.  Musculoskeletal:        General: No swelling.     Right lower leg: No edema.     Left lower leg: No edema.  Lymphadenopathy:     Cervical: No cervical adenopathy.  Skin:    General: Skin is warm and dry.  Neurological:     General: No focal deficit present.     Mental Status: He is alert and oriented to person, place, and time.  Psychiatric:        Mood and Affect: Mood normal.        Behavior: Behavior normal.     LABORATORY DATA:  I have reviewed the labs as listed.  CBC    Component Value Date/Time   WBC 9.2 03/07/2021 1246   RBC 5.32 03/07/2021 1246   HGB 17.4 (H) 03/07/2021 1246   HCT 48.8 03/07/2021 1246   PLT 265 03/07/2021 1246   MCV 91.7 03/07/2021 1246   MCH 32.7 03/07/2021 1246   MCHC 35.7 03/07/2021 1246   RDW 13.0 03/07/2021 1246   LYMPHSABS 1.8 02/07/2021 0802   MONOABS 0.7 02/07/2021 0802   EOSABS 0.4 02/07/2021 0802   BASOSABS 0.1 02/07/2021  0802   CMP Latest Ref Rng & Units 12/12/2020 11/28/2020 07/25/2020  Glucose 70 - 99 mg/dL 120(H) 145(H) 103(H)  BUN 6 - 20 mg/dL 36(H) 26(H) 37(H)  Creatinine 0.61 - 1.24 mg/dL 1.52(H) 1.75(H) 2.06(H)  Sodium 135 - 145 mmol/L 138 137 137  Potassium 3.5 - 5.1 mmol/L 4.0 4.0 4.7  Chloride 98 - 111 mmol/L 106 106 104  CO2 22 - 32 mmol/L 25 25 26   Calcium 8.9 - 10.3 mg/dL 9.2 8.7(L) 9.3  Total Protein 6.5 - 8.1 g/dL - 7.6 -  Total Bilirubin 0.3 - 1.2 mg/dL - 0.4 -  Alkaline Phos 38 - 126 U/L - 42 -  AST 15 - 41 U/L - 34 -  ALT 0 - 44 U/L - 36 -    DIAGNOSTIC IMAGING:  I have independently reviewed the relevant imaging and discussed with the patient.  ASSESSMENT & PLAN: 1.  Secondary polycythemia - Patient seen at the request of Dr. Louis Meckel for erythrocytosis, with CBC from 12/24/2020 showing Hgb 19.8/HCT 59.8 - He reports that he has been on testosterone supplements for over a decade.  Currently receiving testosterone 100 mg every 10 days.   - Patient has OSA, is compliant with CPAP. - On 11/28/2020, he was evaluated in the ER with right-sided numbness and weakness which developed after he briefly passed out for less than 30 seconds.  Hemoglobin at that time was 18.3.  He was evaluated for stroke and was thought to have a TIA.  He does not report any headaches or vision changes. - Work-up was negative for any mutation of JAK2 V617F, CALR, Exon 12, MPL.  Erythropoietin elevated at 25.7.  LDH normal. - Erythropoietin was elevated at 25.7.  Renal ultrasound (02/23/2021) showed 2.7 cm hypoechoic mass in right kidney.  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 - He is on Eliquis due to atrial fibrillation.  He does not take aspirin. - Received phlebotomy x2 on 01/16/2021 and 01/23/2021 - Currently asymptomatic.  No aquagenic pruritus,  vasomotor  symptoms, or erythromelalgia.  No recurrent strokelike symptoms.    - CBC today (05/18/2021): Hgb 17.7/HCT 49.9 - Differential diagnosis favors secondary polycythemia in the setting of testosterone supplementation.  Patient would likely benefit from intermittent phlebotomy, especially given TIA around 11/28/2020. - PLAN: CBC and possible phlebotomy (if HCT  > 50.0) every 12 weeks.  - RTC in 6 months for repeat labs and follow-up.   2.  Social/family history: - He is on Eliquis for atrial fibrillation. - He lives at home with his wife.  He is a Engineer, maintenance (IT).  Non-smoker. - No family history of polycythemia.  Father had colon cancer in his mid 38s.  Mother had cervical cancer in her 17s.  Maternal grandfather had bladder cancer.   PLAN SUMMARY & DISPOSITION: CBC + possible phlebotomy every 12 weeks Labs and RTC in 6 months  All questions were answered. The patient knows to call the clinic with any problems, questions or concerns.  Medical decision making: Low  Time spent on visit: I spent 15 minutes counseling the patient face to face. The total time spent in the appointment was 25 minutes and more than 50% was on counseling.   Albert Rush, PA-C  05/18/21 1:39 PM

## 2021-05-18 ENCOUNTER — Other Ambulatory Visit: Payer: Self-pay

## 2021-05-18 ENCOUNTER — Inpatient Hospital Stay (HOSPITAL_COMMUNITY): Payer: BC Managed Care – PPO

## 2021-05-18 ENCOUNTER — Inpatient Hospital Stay (HOSPITAL_COMMUNITY): Payer: BC Managed Care – PPO | Attending: Hematology

## 2021-05-18 ENCOUNTER — Inpatient Hospital Stay (HOSPITAL_BASED_OUTPATIENT_CLINIC_OR_DEPARTMENT_OTHER): Payer: BC Managed Care – PPO | Admitting: Physician Assistant

## 2021-05-18 VITALS — BP 127/62 | HR 67 | Temp 98.3°F | Resp 20 | Ht 69.49 in | Wt 315.0 lb

## 2021-05-18 DIAGNOSIS — I4891 Unspecified atrial fibrillation: Secondary | ICD-10-CM | POA: Diagnosis not present

## 2021-05-18 DIAGNOSIS — Z79899 Other long term (current) drug therapy: Secondary | ICD-10-CM | POA: Diagnosis not present

## 2021-05-18 DIAGNOSIS — D751 Secondary polycythemia: Secondary | ICD-10-CM | POA: Diagnosis not present

## 2021-05-18 DIAGNOSIS — Z8673 Personal history of transient ischemic attack (TIA), and cerebral infarction without residual deficits: Secondary | ICD-10-CM | POA: Insufficient documentation

## 2021-05-18 DIAGNOSIS — Z7901 Long term (current) use of anticoagulants: Secondary | ICD-10-CM | POA: Insufficient documentation

## 2021-05-18 DIAGNOSIS — Z79890 Hormone replacement therapy (postmenopausal): Secondary | ICD-10-CM | POA: Insufficient documentation

## 2021-05-18 DIAGNOSIS — Z791 Long term (current) use of non-steroidal anti-inflammatories (NSAID): Secondary | ICD-10-CM | POA: Diagnosis not present

## 2021-05-18 LAB — IRON AND TIBC
Iron: 66 ug/dL (ref 45–182)
Saturation Ratios: 21 % (ref 17.9–39.5)
TIBC: 309 ug/dL (ref 250–450)
UIBC: 243 ug/dL

## 2021-05-18 LAB — CBC WITH DIFFERENTIAL/PLATELET
Abs Immature Granulocytes: 0.03 10*3/uL (ref 0.00–0.07)
Basophils Absolute: 0.1 10*3/uL (ref 0.0–0.1)
Basophils Relative: 1 %
Eosinophils Absolute: 0.4 10*3/uL (ref 0.0–0.5)
Eosinophils Relative: 5 %
HCT: 49.9 % (ref 39.0–52.0)
Hemoglobin: 17.7 g/dL — ABNORMAL HIGH (ref 13.0–17.0)
Immature Granulocytes: 0 %
Lymphocytes Relative: 27 %
Lymphs Abs: 2.2 10*3/uL (ref 0.7–4.0)
MCH: 32.6 pg (ref 26.0–34.0)
MCHC: 35.5 g/dL (ref 30.0–36.0)
MCV: 91.9 fL (ref 80.0–100.0)
Monocytes Absolute: 0.9 10*3/uL (ref 0.1–1.0)
Monocytes Relative: 11 %
Neutro Abs: 4.5 10*3/uL (ref 1.7–7.7)
Neutrophils Relative %: 56 %
Platelets: 243 10*3/uL (ref 150–400)
RBC: 5.43 MIL/uL (ref 4.22–5.81)
RDW: 13 % (ref 11.5–15.5)
WBC: 8.1 10*3/uL (ref 4.0–10.5)
nRBC: 0 % (ref 0.0–0.2)

## 2021-05-18 LAB — FERRITIN: Ferritin: 235 ng/mL (ref 24–336)

## 2021-05-18 NOTE — Patient Instructions (Signed)
[place] at [facility] Discharge Instructions  You were seen today by Tarri Abernethy PA-C for your elevated hemoglobin/elevated hematocrit.  This is called "secondary polycythemia," which means that your blood counts are elevated due to a separate cause.  In your case, we suspect that the cause of your elevated blood counts is your testosterone supplementation.  Your elevated blood counts can place you at increased risk for heart attack, stroke, and blood clots.  Since you have already had a TIA ("mini stroke") in the past, we recommend that we proceed with labs and phlebotomy every 12 weeks as needed (if hematocrit greater than 50) to maintain a safer amount of hemoglobin in your blood.  LABS: Return in 12 weeks for repeat blood count and possible phlebotomy.  TREATMENT: Phlebotomy as needed every 12 weeks if hematocrit greater than 50.0  FOLLOW-UP APPOINTMENT: Office visit in 6 months   Thank you for choosing [place] at [facility] to provide your oncology and hematology care.  To afford each patient quality time with our provider, please arrive at least 15 minutes before your scheduled appointment time.   If you have a lab appointment with the [facility] please come in thru the Main Entrance and check in at the main information desk.  You need to re-schedule your appointment should you arrive 10 or more minutes late.  We strive to give you quality time with our providers, and arriving late affects you and other patients whose appointments are after yours.  Also, if you no show three or more times for appointments you may be dismissed from the clinic at the providers discretion.     Again, thank you for choosing [facility].  Our hope is that these requests will decrease the amount of time that you wait before being seen by our physicians.       _____________________________________________________________  Should you have  questions after your visit to [facility], please contact our office at [phone] and follow the prompts.  Our office hours are 8:00 a.m. and 4:30 p.m. Monday - Friday.  Please note that voicemails left after 4:00 p.m. may not be returned until the following business day.  We are closed weekends and major holidays.  You do have access to a nurse 24-7, just call the main number to the clinic [phone] and do not press any options, hold on the line and a nurse will answer the phone.    For prescription refill requests, have your pharmacy contact our office and allow 72 hours.    Due to Covid, you will need to wear a mask upon entering the hospital. If you do not have a mask, a mask will be given to you at the Main Entrance upon arrival. For doctor visits, patients may have 1 support person age 1 or older with them. For treatment visits, patients can not have anyone with them due to social distancing guidelines and our immunocompromised population.

## 2021-05-18 NOTE — Progress Notes (Signed)
No phlebotomy today.  See providers note.

## 2021-06-02 ENCOUNTER — Encounter (HOSPITAL_COMMUNITY): Payer: Self-pay

## 2021-06-02 ENCOUNTER — Ambulatory Visit (HOSPITAL_COMMUNITY): Payer: BC Managed Care – PPO | Admitting: Physician Assistant

## 2021-06-13 ENCOUNTER — Other Ambulatory Visit: Payer: Self-pay

## 2021-06-13 ENCOUNTER — Ambulatory Visit (INDEPENDENT_AMBULATORY_CARE_PROVIDER_SITE_OTHER): Payer: BC Managed Care – PPO | Admitting: Dermatology

## 2021-06-13 DIAGNOSIS — Z1283 Encounter for screening for malignant neoplasm of skin: Secondary | ICD-10-CM

## 2021-06-13 DIAGNOSIS — D1801 Hemangioma of skin and subcutaneous tissue: Secondary | ICD-10-CM | POA: Diagnosis not present

## 2021-06-13 DIAGNOSIS — L309 Dermatitis, unspecified: Secondary | ICD-10-CM

## 2021-06-13 DIAGNOSIS — L720 Epidermal cyst: Secondary | ICD-10-CM

## 2021-06-13 DIAGNOSIS — L821 Other seborrheic keratosis: Secondary | ICD-10-CM | POA: Diagnosis not present

## 2021-06-13 DIAGNOSIS — L918 Other hypertrophic disorders of the skin: Secondary | ICD-10-CM

## 2021-06-13 MED ORDER — HYDROCORTISONE 2.5 % EX OINT: TOPICAL_OINTMENT | Freq: Two times a day (BID) | CUTANEOUS | 6 refills | Status: DC

## 2021-06-25 ENCOUNTER — Encounter: Payer: Self-pay | Admitting: Dermatology

## 2021-06-25 NOTE — Progress Notes (Signed)
? ?  New Patient ?  ?Subjective  ?Albert [name] is a 51 y.o. male who presents for the following: Annual Exam (Pt here for annual. Pt has a few spots on the face that has been scabbing over. Skin tags and spots on the eyelid he would like removed. Pt has hx personal or family hx of melanoma or non melanoma skin cancer that her knows of ). ? ?General skin examination, several issues he would like addressed ?Location:  ?Duration:  ?Quality:  ?Associated Signs/Symptoms: ?Modifying Factors:  ?Severity:  ?Timing: ?Context:  ? ? ?The following portions of the chart were reviewed this encounter and updated as appropriate:   ?  ? ?Objective  ?Well appearing patient in no apparent distress; mood and affect are within normal limits. ? ? ?A full examination was performed including scalp, head, eyes, ears, nose, lips, neck, chest, axillae, abdomen, back, buttocks, bilateral upper extremities, bilateral lower extremities, hands, feet, fingers, toes, fingernails, and toenails. All findings within normal limits unless otherwise noted below. ? ? ?Assessment & Plan  ?Encounter for screening for malignant neoplasm of skin ? ?Cherry angioma ?Chest - Medial [facility]) ? ?Seborrheic keratosis ?Mid Back ? ?Milium ?Right Supraorbital Region ? ?Dermatitis ?Left Temporal Scalp; Right Temporal Scalp ? ?Related Medications ?hydrocortisone 2.5 % ointment ?Apply topically 2 (two) times daily. Apply to temples for 4 to 6 weeks ? ?Skin tag (2) ?Left Medial Thigh; Right Medial Thigh ? ?After addressing the patient's dermatology related needs, I spent 15 minutes discussing modern concepts of weight loss.  He is ready had some success with lifestyle changes.  I reviewed recent information about Wegovy and Mounjaro.  He was appreciative. ?

## 2021-07-20 ENCOUNTER — Encounter: Payer: BC Managed Care – PPO | Admitting: Dermatology

## 2021-07-28 ENCOUNTER — Other Ambulatory Visit (HOSPITAL_COMMUNITY): Payer: Self-pay | Admitting: Physician Assistant

## 2021-08-03 ENCOUNTER — Ambulatory Visit: Payer: BC Managed Care – PPO | Admitting: Cardiology

## 2021-08-17 ENCOUNTER — Inpatient Hospital Stay (HOSPITAL_COMMUNITY): Payer: BC Managed Care – PPO

## 2021-08-23 ENCOUNTER — Ambulatory Visit: Payer: BC Managed Care – PPO | Admitting: Adult Health

## 2021-08-29 ENCOUNTER — Other Ambulatory Visit (HOSPITAL_COMMUNITY): Payer: Self-pay | Admitting: Physician Assistant

## 2021-08-31 ENCOUNTER — Encounter: Payer: BC Managed Care – PPO | Admitting: Dermatology

## 2021-09-05 ENCOUNTER — Inpatient Hospital Stay (HOSPITAL_COMMUNITY): Payer: BC Managed Care – PPO | Attending: Hematology

## 2021-09-05 ENCOUNTER — Inpatient Hospital Stay (HOSPITAL_COMMUNITY): Payer: BC Managed Care – PPO

## 2021-09-05 DIAGNOSIS — D751 Secondary polycythemia: Secondary | ICD-10-CM | POA: Diagnosis present

## 2021-09-05 LAB — CBC
HCT: 49.8 % (ref 39.0–52.0)
Hemoglobin: 17.7 g/dL — ABNORMAL HIGH (ref 13.0–17.0)
MCH: 31.7 pg (ref 26.0–34.0)
MCHC: 35.5 g/dL (ref 30.0–36.0)
MCV: 89.2 fL (ref 80.0–100.0)
Platelets: 259 10*3/uL (ref 150–400)
RBC: 5.58 MIL/uL (ref 4.22–5.81)
RDW: 12.2 % (ref 11.5–15.5)
WBC: 10.7 10*3/uL — ABNORMAL HIGH (ref 4.0–10.5)
nRBC: 0 % (ref 0.0–0.2)

## 2021-09-05 NOTE — Progress Notes (Signed)
Patient's hematocrit is 49.8. ok to hold off on phlebotomy today per Tarri Abernethy PA-C. Patient stated he felt great, no issues. Will follow up as scheduled.

## 2021-10-05 ENCOUNTER — Ambulatory Visit (INDEPENDENT_AMBULATORY_CARE_PROVIDER_SITE_OTHER): Payer: BC Managed Care – PPO | Admitting: Dermatology

## 2021-10-05 ENCOUNTER — Encounter: Payer: Self-pay | Admitting: Dermatology

## 2021-10-05 DIAGNOSIS — D485 Neoplasm of uncertain behavior of skin: Secondary | ICD-10-CM | POA: Diagnosis not present

## 2021-10-05 DIAGNOSIS — L729 Follicular cyst of the skin and subcutaneous tissue, unspecified: Secondary | ICD-10-CM | POA: Diagnosis not present

## 2021-10-05 DIAGNOSIS — L918 Other hypertrophic disorders of the skin: Secondary | ICD-10-CM | POA: Diagnosis not present

## 2021-10-05 NOTE — Patient Instructions (Signed)

## 2021-10-28 ENCOUNTER — Encounter: Payer: Self-pay | Admitting: Dermatology

## 2021-10-28 NOTE — Progress Notes (Signed)
Follow-Up Visit   Subjective  Albert [name] is a 51 y.o. male who presents for the following: Procedure (Here to have skin tags removed. Groin, underarms & outer eyes).    Isotretinoin Follow-Up Visit   Subjective  Albert [name] is a 51 y.o. male who presents for the following: Procedure (Here to have skin tags removed. Groin, underarms & outer eyes).  Cyst on eyelid, skin tags, growth on neck. Location:  Duration:  Quality:  Associated Signs/Symptoms: Modifying Factors:  Severity:  Timing: Context:   The following portions of the chart were reviewed this encounter and updated as appropriate:        Objective  Well appearing patient in no apparent distress; mood and affect are within normal limits.  All skin waist up examined.  Neck - Anterior Verrucous 5 mm raised papule  Left Buccal Cheek, Right Upper Eyelid Two 4 mm dermal white papules compatible with small epidermoid cysts  Left Anterior Neck (2) Multiple pedunculated 2 mm flesh-colored papule.  Patient requests these be removed and understands the procedure may not be covered by insurance.   Assessment & Plan  Neoplasm of uncertain behavior of skin Neck - Anterior  Skin / nail biopsy Type of biopsy: tangential   Informed consent: discussed and consent obtained   Timeout: patient name, date of birth, surgical site, and procedure verified   Anesthesia: the lesion was anesthetized in a standard fashion   Anesthetic:  1% lidocaine w/ epinephrine 1-100,000 local infiltration Instrument used: flexible razor blade   Hemostasis achieved with: aluminum chloride and electrodesiccation   Outcome: patient tolerated procedure well   Post-procedure details: wound care instructions given    Specimen 1 - Surgical pathology Differential Diagnosis: nevus   Check Margins: No  Follicular cyst of skin and subcutaneous tissue (2) Right Upper Eyelid; Left Buccal Cheek  Discussed elective excision, no procedure  scheduled  Incision and Drainage - Right Upper Eyelid Location: right outer eye   Informed Consent: Discussed risks (permanent scarring, light or dark discoloration, infection, pain, bleeding, bruising, redness, damage to adjacent structures, and recurrence of the lesion) and benefits of the procedure, as well as the alternatives.  Informed consent was obtained.  Preparation: The area was prepped with alcohol.  Anesthesia: Lidocaine 2% with epinephrine  Procedure Details: An incision was made overlying the lesion. The lesion drained pus, clear, mucoid fluid, blood. A small amount of fluid was drained.    Antibiotic ointment and a sterile pressure dressing were applied. The patient tolerated procedure well.  Total number of lesions drained: 1  Plan: The patient was instructed on post-op care. Recommend OTC analgesia as needed for pain.   Cutaneous skin tags (2) Left Anterior Neck  Scissor excision of skin tags  Is growth on neck and requests removal of several skin tags. Location:  Duration:  Quality:  Associated Signs/Symptoms: Modifying Factors:  Severity:  Timing: Context:   Objective  Well appearing patient in no apparent distress; mood and affect are within normal limits. Neck - Anterior Verrucous 5 mm raised papule  Left Buccal Cheek, Right Upper Eyelid Two 4 mm dermal white papules compatible with small epidermoid cysts  Left Anterior Neck (2) Multiple pedunculated 2 mm flesh-colored papule.  Patient requests these be removed and understands the procedure may not be covered by insurance.    All skin waist up examined.   Assessment & Plan    Neoplasm of uncertain behavior of skin Neck - Anterior  Skin / nail biopsy Type of  biopsy: tangential   Informed consent: discussed and consent obtained   Timeout: patient name, date of birth, surgical site, and procedure verified   Anesthesia: the lesion was anesthetized in a standard fashion   Anesthetic:  1%  lidocaine w/ epinephrine 1-100,000 local infiltration Instrument used: flexible razor blade   Hemostasis achieved with: aluminum chloride and electrodesiccation   Outcome: patient tolerated procedure well   Post-procedure details: wound care instructions given    Specimen 1 - Surgical pathology Differential Diagnosis: nevus   Check Margins: No  Follicular cyst of skin and subcutaneous tissue (2) Right Upper Eyelid; Left Buccal Cheek  Discussed elective excision, no procedure scheduled  Incision and Drainage - Right Upper Eyelid Location: right outer eye   Informed Consent: Discussed risks (permanent scarring, light or dark discoloration, infection, pain, bleeding, bruising, redness, damage to adjacent structures, and recurrence of the lesion) and benefits of the procedure, as well as the alternatives.  Informed consent was obtained.  Preparation: The area was prepped with alcohol.  Anesthesia: Lidocaine 2% with epinephrine  Procedure Details: An incision was made overlying the lesion. The lesion drained pus, clear, mucoid fluid, blood. A small amount of fluid was drained.    Antibiotic ointment and a sterile pressure dressing were applied. The patient tolerated procedure well.  Total number of lesions drained: 1  Plan: The patient was instructed on post-op care. Recommend OTC analgesia as needed for pain.   Cutaneous skin tags (2) Left Anterior Neck  Scissor excision of skin tags      I, Lavonna Monarch, MD, have reviewed all documentation for this visit.  The documentation on 10/28/21 for the exam, diagnosis, procedures, and orders are all accurate and complete.

## 2021-11-17 ENCOUNTER — Ambulatory Visit: Payer: BC Managed Care – PPO | Admitting: Cardiology

## 2021-11-22 NOTE — Progress Notes (Deleted)
RESCHEDULED

## 2021-11-23 ENCOUNTER — Inpatient Hospital Stay: Payer: BC Managed Care – PPO

## 2021-11-23 ENCOUNTER — Inpatient Hospital Stay: Payer: BC Managed Care – PPO | Admitting: Physician Assistant

## 2021-11-30 ENCOUNTER — Other Ambulatory Visit (HOSPITAL_COMMUNITY): Payer: Self-pay | Admitting: Physician Assistant

## 2021-12-14 ENCOUNTER — Encounter: Payer: Self-pay | Admitting: Cardiology

## 2021-12-21 ENCOUNTER — Other Ambulatory Visit: Payer: Self-pay

## 2021-12-21 ENCOUNTER — Observation Stay (HOSPITAL_COMMUNITY)
Admission: EM | Admit: 2021-12-21 | Discharge: 2021-12-23 | Disposition: A | Discharge status: Home / Self Care | Payer: BC Managed Care – PPO | Attending: Emergency Medicine | Admitting: Emergency Medicine

## 2021-12-21 ENCOUNTER — Encounter (HOSPITAL_COMMUNITY): Payer: Self-pay | Admitting: Emergency Medicine

## 2021-12-21 ENCOUNTER — Emergency Department (HOSPITAL_COMMUNITY): Payer: BC Managed Care – PPO

## 2021-12-21 DIAGNOSIS — K3580 Unspecified acute appendicitis: Principal | ICD-10-CM | POA: Diagnosis present

## 2021-12-21 DIAGNOSIS — D45 Polycythemia vera: Secondary | ICD-10-CM | POA: Diagnosis present

## 2021-12-21 DIAGNOSIS — Z8673 Personal history of transient ischemic attack (TIA), and cerebral infarction without residual deficits: Secondary | ICD-10-CM | POA: Insufficient documentation

## 2021-12-21 DIAGNOSIS — E119 Type 2 diabetes mellitus without complications: Secondary | ICD-10-CM | POA: Diagnosis not present

## 2021-12-21 DIAGNOSIS — R0789 Other chest pain: Secondary | ICD-10-CM

## 2021-12-21 DIAGNOSIS — Z7901 Long term (current) use of anticoagulants: Secondary | ICD-10-CM | POA: Insufficient documentation

## 2021-12-21 DIAGNOSIS — I10 Essential (primary) hypertension: Secondary | ICD-10-CM | POA: Insufficient documentation

## 2021-12-21 DIAGNOSIS — K381 Appendicular concretions: Secondary | ICD-10-CM | POA: Diagnosis present

## 2021-12-21 DIAGNOSIS — K353 Acute appendicitis with localized peritonitis, without perforation or gangrene: Secondary | ICD-10-CM | POA: Diagnosis not present

## 2021-12-21 DIAGNOSIS — Z880 Allergy status to penicillin: Secondary | ICD-10-CM

## 2021-12-21 DIAGNOSIS — Z881 Allergy status to other antibiotic agents status: Secondary | ICD-10-CM

## 2021-12-21 DIAGNOSIS — M109 Gout, unspecified: Secondary | ICD-10-CM | POA: Diagnosis present

## 2021-12-21 DIAGNOSIS — Z79899 Other long term (current) drug therapy: Secondary | ICD-10-CM | POA: Diagnosis not present

## 2021-12-21 DIAGNOSIS — K219 Gastro-esophageal reflux disease without esophagitis: Secondary | ICD-10-CM | POA: Diagnosis present

## 2021-12-21 DIAGNOSIS — R1030 Lower abdominal pain, unspecified: Secondary | ICD-10-CM | POA: Diagnosis present

## 2021-12-21 DIAGNOSIS — I480 Paroxysmal atrial fibrillation: Secondary | ICD-10-CM | POA: Insufficient documentation

## 2021-12-21 DIAGNOSIS — Z6841 Body Mass Index (BMI) 40.0 and over, adult: Secondary | ICD-10-CM

## 2021-12-21 LAB — BASIC METABOLIC PANEL
Anion gap: 9 (ref 5–15)
BUN: 35 mg/dL — ABNORMAL HIGH (ref 6–20)
CO2: 23 mmol/L (ref 22–32)
Calcium: 8.9 mg/dL (ref 8.9–10.3)
Chloride: 100 mmol/L (ref 98–111)
Creatinine, Ser: 1.52 mg/dL — ABNORMAL HIGH (ref 0.61–1.24)
GFR, Estimated: 55 mL/min — ABNORMAL LOW (ref >60)
Glucose, Bld: 149 mg/dL — ABNORMAL HIGH (ref 70–99)
Potassium: 4.2 mmol/L (ref 3.5–5.1)
Sodium: 132 mmol/L — ABNORMAL LOW (ref 135–145)

## 2021-12-21 LAB — CBC
HCT: 48.5 % (ref 39.0–52.0)
Hemoglobin: 17.4 g/dL — ABNORMAL HIGH (ref 13.0–17.0)
MCH: 32 pg (ref 26.0–34.0)
MCHC: 35.9 g/dL (ref 30.0–36.0)
MCV: 89.2 fL (ref 80.0–100.0)
Platelets: 217 10*3/uL (ref 150–400)
RBC: 5.44 MIL/uL (ref 4.22–5.81)
RDW: 12.3 % (ref 11.5–15.5)
WBC: 12.2 10*3/uL — ABNORMAL HIGH (ref 4.0–10.5)
nRBC: 0 % (ref 0.0–0.2)

## 2021-12-21 LAB — LIPASE, BLOOD: Lipase: 36 U/L (ref 11–51)

## 2021-12-21 LAB — TROPONIN I (HIGH SENSITIVITY)
Troponin I (High Sensitivity): 5 ng/L (ref <18)
Troponin I (High Sensitivity): 4 ng/L (ref <18)

## 2021-12-21 MED ORDER — METRONIDAZOLE 500 MG/100ML IV SOLN
500.0000 mg | Freq: Two times a day (BID) | INTRAVENOUS | Status: DC
  Administered 2021-12-21 – 2021-12-22 (×2): 500 mg via INTRAVENOUS
  Filled 2021-12-21 (×2): qty 100

## 2021-12-21 MED ORDER — ROSUVASTATIN CALCIUM 20 MG PO TABS: 20.0000 mg | ORAL_TABLET | Freq: Every day | ORAL | Status: DC

## 2021-12-21 MED ORDER — ACETAMINOPHEN 325 MG PO TABS: 650.0000 mg | ORAL_TABLET | Freq: Four times a day (QID) | ORAL | Status: DC | PRN

## 2021-12-21 MED ORDER — IOHEXOL 300 MG/ML  SOLN
100.0000 mL | Freq: Once | INTRAMUSCULAR | Status: AC | PRN
  Administered 2021-12-21: 100 mL via INTRAVENOUS

## 2021-12-21 MED ORDER — INFLUENZA VAC SPLIT QUAD 0.5 ML IM SUSY: 0.5000 mL | PREFILLED_SYRINGE | INTRAMUSCULAR | Status: DC

## 2021-12-21 MED ORDER — CHLORHEXIDINE GLUCONATE CLOTH 2 % EX PADS: 6.0000 | MEDICATED_PAD | Freq: Once | CUTANEOUS | Status: DC

## 2021-12-21 MED ORDER — SODIUM CHLORIDE 0.9 % IV SOLN: 2.0000 g | INTRAVENOUS | Status: DC

## 2021-12-21 MED ORDER — HYDROCODONE-ACETAMINOPHEN 5-325 MG PO TABS: 1.0000 | ORAL_TABLET | ORAL | Status: DC | PRN

## 2021-12-21 MED ORDER — DRONEDARONE HCL 400 MG PO TABS
400.0000 mg | ORAL_TABLET | Freq: Two times a day (BID) | ORAL | Status: DC
  Administered 2021-12-21 – 2021-12-23 (×3): 400 mg via ORAL
  Filled 2021-12-21 (×3): qty 1

## 2021-12-21 MED ORDER — METOPROLOL TARTRATE 25 MG PO TABS
25.0000 mg | ORAL_TABLET | Freq: Two times a day (BID) | ORAL | Status: DC
  Administered 2021-12-21 – 2021-12-23 (×4): 25 mg via ORAL
  Filled 2021-12-21 (×5): qty 1

## 2021-12-21 MED ORDER — SIMETHICONE 80 MG PO CHEW: 40.0000 mg | CHEWABLE_TABLET | Freq: Four times a day (QID) | ORAL | Status: DC | PRN

## 2021-12-21 MED ORDER — ONDANSETRON HCL 4 MG/2ML IJ SOLN
4.0000 mg | Freq: Four times a day (QID) | INTRAMUSCULAR | Status: DC | PRN
  Administered 2021-12-21 – 2021-12-22 (×4): 4 mg via INTRAVENOUS
  Filled 2021-12-21 (×3): qty 2

## 2021-12-21 MED ORDER — METRONIDAZOLE 500 MG/100ML IV SOLN: 500.0000 mg | Freq: Two times a day (BID) | INTRAVENOUS | Status: DC

## 2021-12-21 MED ORDER — SODIUM CHLORIDE 0.9 % IV SOLN
1.0000 g | Freq: Once | INTRAVENOUS | Status: AC
  Administered 2021-12-21: 1 g via INTRAVENOUS
  Filled 2021-12-21: qty 10

## 2021-12-21 MED ORDER — KETOROLAC TROMETHAMINE 30 MG/ML IJ SOLN
30.0000 mg | Freq: Once | INTRAMUSCULAR | Status: AC
  Administered 2021-12-21: 30 mg via INTRAVENOUS
  Filled 2021-12-21: qty 1

## 2021-12-21 MED ORDER — FLUTICASONE PROPIONATE 50 MCG/ACT NA SUSP: 1.0000 | Freq: Every day | NASAL | Status: DC | PRN

## 2021-12-21 MED ORDER — METRONIDAZOLE 500 MG/100ML IV SOLN
500.0000 mg | Freq: Once | INTRAVENOUS | Status: AC
  Administered 2021-12-21: 500 mg via INTRAVENOUS
  Filled 2021-12-21: qty 100

## 2021-12-21 MED ORDER — ONDANSETRON 4 MG PO TBDP: 4.0000 mg | ORAL_TABLET | Freq: Four times a day (QID) | ORAL | Status: DC | PRN

## 2021-12-21 MED ORDER — PANTOPRAZOLE SODIUM 40 MG PO TBEC
40.0000 mg | DELAYED_RELEASE_TABLET | Freq: Every day | ORAL | Status: DC
  Administered 2021-12-21: 40 mg via ORAL
  Filled 2021-12-21: qty 1

## 2021-12-21 MED ORDER — SODIUM CHLORIDE 0.9 % IV SOLN
2.0000 g | INTRAVENOUS | Status: DC
  Administered 2021-12-21: 2 g via INTRAVENOUS
  Filled 2021-12-21: qty 20

## 2021-12-21 MED ORDER — HYDROMORPHONE HCL 1 MG/ML IJ SOLN
1.0000 mg | INTRAMUSCULAR | Status: DC | PRN
  Administered 2021-12-21 – 2021-12-22 (×7): 1 mg via INTRAVENOUS
  Filled 2021-12-21 (×7): qty 1

## 2021-12-21 MED ORDER — CHLORHEXIDINE GLUCONATE CLOTH 2 % EX PADS
6.0000 | MEDICATED_PAD | Freq: Once | CUTANEOUS | Status: AC
  Administered 2021-12-21: 6 via TOPICAL

## 2021-12-21 MED ORDER — ACETAMINOPHEN 650 MG RE SUPP: 650.0000 mg | Freq: Four times a day (QID) | RECTAL | Status: DC | PRN

## 2021-12-21 MED ORDER — LACTATED RINGERS IV SOLN: INTRAVENOUS | Status: DC

## 2021-12-21 NOTE — ED Notes (Signed)
Dr. Arnoldo Morale in the room.

## 2021-12-21 NOTE — ED Notes (Signed)
Dr. Arnoldo Morale verbalized clear liquids and will not have surgery until tomorrow.

## 2021-12-21 NOTE — ED Notes (Signed)
Patient transported to CT 

## 2021-12-21 NOTE — H&P (Signed)
Albert [name] is an 51 y.o. male.   Chief Complaint: Acute appendicitis HPI: Patient is a 51 year old white male with multiple medical problems including intermittent atrial fibrillation, diabetes mellitus, hypertension, and polycythemia who started having lower abdominal pain yesterday evening.  He presented to the emergency room this morning and was found on CT scan of the abdomen to have acute appendicitis with appendicolith.  He is on Eliquis and last took a dose yesterday evening.  He denies any significant nausea.  His pain is well controlled with Toradol.  Past Medical History:  Diagnosis Date   Atrial fibrillation ([place])    CVA (cerebral vascular accident) ([place]) 2022   Diabetes mellitus without complication ([place])    GERD (gastroesophageal reflux disease)    Gout    Hypertension    Low testosterone    Polycythemia, 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   Sleep apnea     Past Surgical History:  Procedure Laterality Date   CARDIAC CATHETERIZATION     COLONOSCOPY     COLONOSCOPY N/A 05/11/2015   Procedure: COLONOSCOPY;  Surgeon: Rogene Houston, MD;  [facility];  Service: Endoscopy;  Laterality: N/A;  930 - moved to 2/15 - Ann to notify   COLONOSCOPY WITH PROPOFOL N/A 09/07/2020   Procedure: COLONOSCOPY WITH PROPOFOL;  Surgeon: Rogene Houston, MD;  Location: [facility];  Service: Endoscopy;  Laterality: N/A;  am   POLYPECTOMY  09/07/2020   Procedure: POLYPECTOMY;  Surgeon: Rogene Houston, MD;  Location: [facility];  Service: Endoscopy;;    Family History  Problem Relation Age of Onset   Colon cancer Father    Social History:  reports that he has never smoked. He has never used smokeless tobacco. He reports that he does not currently use alcohol. He reports that he does not use drugs.  Allergies:  Allergies  Allergen Reactions   Penicillins Other (See Comments)    Unknown childhood   Levaquin [Levofloxacin] Palpitations    (Not in a hospital  admission)   Results for orders placed or performed during the hospital encounter of 12/21/21 (from the past 48 hour(s))  Basic metabolic panel     Status: Abnormal   Collection Time: 12/21/21  4:16 AM  Result Value Ref Range   Sodium 132 (L) 135 - 145 mmol/L   Potassium 4.2 3.5 - 5.1 mmol/L   Chloride 100 98 - 111 mmol/L   CO2 23 22 - 32 mmol/L   Glucose, Bld 149 (H) 70 - 99 mg/dL    Comment: Glucose reference range applies only to samples taken after fasting for at least 8 hours.   BUN 35 (H) 6 - 20 mg/dL   Creatinine, Ser 1.52 (H) 0.61 - 1.24 mg/dL   Calcium 8.9 8.9 - 10.3 mg/dL   GFR, Estimated 55 (L) >60 mL/min    Comment: (NOTE) Calculated using the CKD-EPI Creatinine Equation (2021)    Anion gap 9 5 - 15    Comment: Performed at [facility], [address] [place], [place] [address]  CBC     Status: Abnormal   Collection Time: 12/21/21  4:16 AM  Result Value Ref Range   WBC 12.2 (H) 4.0 - 10.5 K/uL   RBC 5.44 4.22 - 5.81 MIL/uL   Hemoglobin 17.4 (H) 13.0 - 17.0 g/dL   HCT 48.5 39.0 - 52.0 %   MCV 89.2 80.0 - 100.0 fL   MCH 32.0 26.0 - 34.0 pg   MCHC 35.9 30.0 - 36.0 g/dL  RDW 12.3 11.5 - 15.5 %   Platelets 217 150 - 400 K/uL   nRBC 0.0 0.0 - 0.2 %    Comment: Performed at [facility], [address], [place], [place] [address]  Troponin I (High Sensitivity)     Status: None   Collection Time: 12/21/21  4:16 AM  Result Value Ref Range   Troponin I (High Sensitivity) 5 <18 ng/L    Comment: (NOTE) Elevated high sensitivity troponin I (hsTnI) values and significant  changes across serial measurements may suggest ACS but many other  chronic and acute conditions are known to elevate hsTnI results.  Refer to the "Links" section for chest pain algorithms and additional  guidance. Performed at [facility], [address] [place], [place] [address]   Lipase, blood     Status: None   Collection Time: 12/21/21  4:16 AM  Result Value Ref Range   Lipase 36 11 - 51 U/L     Comment: Performed at [facility], [address] [place], [place] [address]  Troponin I (High Sensitivity)     Status: None   Collection Time: 12/21/21  6:16 AM  Result Value Ref Range   Troponin I (High Sensitivity) 4 <18 ng/L    Comment: (NOTE) Elevated high sensitivity troponin I (hsTnI) values and significant  changes across serial measurements may suggest ACS but many other  chronic and acute conditions are known to elevate hsTnI results.  Refer to the "Links" section for chest pain algorithms and additional  guidance. Performed at [facility], [address] [place],  [address]    CT ABDOMEN PELVIS W CONTRAST  Result Date: 12/21/2021 CLINICAL DATA:  51 year old male with history of acute onset of nonlocalized abdominal pain with chest pressure and shortness of breath. EXAM: CT ABDOMEN AND PELVIS WITH CONTRAST TECHNIQUE: Multidetector CT imaging of the abdomen and pelvis was performed using the standard protocol following bolus administration of intravenous contrast. RADIATION DOSE REDUCTION: This exam was performed according to the departmental dose-optimization program which includes automated exposure control, adjustment of the mA and/or kV according to patient size and/or use of iterative reconstruction technique. CONTRAST:  156m OMNIPAQUE IOHEXOL 300 MG/ML  SOLN COMPARISON:  No priors. FINDINGS: Lower chest: Unremarkable. Hepatobiliary: Heterogeneous regions of low attenuation are noted diffusely throughout the hepatic parenchyma, indicative of a background of severe heterogeneous hepatic steatosis. No definite suspicious cystic or solid hepatic lesions are noted. No intra or extrahepatic biliary ductal dilatation. Gallbladder is unremarkable in appearance. Pancreas: No pancreatic mass. No pancreatic ductal dilatation. No pancreatic or peripancreatic fluid collections or inflammatory changes. Spleen: Unremarkable. Adrenals/Urinary Tract: Bilateral kidneys and bilateral  adrenal glands are normal in appearance. No hydroureteronephrosis. Urinary bladder is normal in appearance. Stomach/Bowel: Unenhanced appearance of the stomach is normal. There is no pathologic dilatation of small bowel or colon. The appendix appears mildly dilated and inflamed, concerning for an acute appendicitis. Appendix: Location: Inferior and medial to the cecum Diameter: Up to 13 mm Appendicolith: Present near the ostium Mucosal hyper-enhancement: Difficult to judge given the suboptimal contrast enhancement of today's examination. Extraluminal gas: None Periappendiceal collection: No periappendiceal fluid collection, but there is a small amount of periappendiceal soft tissue stranding. Vascular/Lymphatic: No significant atherosclerotic disease, aneurysm or dissection noted in the abdominal or pelvic vasculature. No lymphadenopathy noted in the abdomen or pelvis. Reproductive: Prostate gland and seminal vesicles are unremarkable in appearance. Other: No significant volume of ascites.  No pneumoperitoneum. Musculoskeletal: There are no aggressive appearing lytic or blastic  lesions noted in the visualized portions of the skeleton. IMPRESSION: 1. Findings are highly concerning for early acute appendicitis, as detailed above. Surgical consultation is recommended. 2. Severe heterogeneous hepatic steatosis. Electronically Signed   By: Vinnie Langton M.D.   On: 12/21/2021 06:00   DG Chest Portable 1 View  Result Date: 12/21/2021 CLINICAL DATA:  51 year old male with chest pain. EXAM: PORTABLE CHEST 1 VIEW COMPARISON:  Portable chest 12/12/2020 and earlier. FINDINGS: Portable AP view at 0425 hours. Lung volumes and mediastinal contours are stable, within normal limits. Visualized tracheal air column is within normal limits. Stable bilateral lung markings since 2021. Allowing for portable technique the lungs are clear. No pneumothorax or pleural effusion. No acute osseous abnormality identified. Paucity of bowel  gas in the upper abdomen. IMPRESSION: No acute cardiopulmonary abnormality. Electronically Signed   By: Genevie Ann M.D.   On: 12/21/2021 04:39    Review of Systems  Constitutional:  Positive for fatigue.  HENT: Negative.    Eyes: Negative.   Respiratory: Negative.    Cardiovascular: Negative.   Gastrointestinal:  Positive for abdominal pain.  Endocrine: Negative.   Genitourinary: Negative.   Musculoskeletal: Negative.   Skin: Negative.   Allergic/Immunologic: Negative.   Neurological: Negative.   Hematological:  Bruises/bleeds easily.  Psychiatric/Behavioral: Negative.      Blood pressure 106/69, pulse 66, temperature 98 F (36.7 C), temperature source Oral, resp. rate 17, height '5\' 11"'$  (1.803 m), weight 136.1 kg, SpO2 99 %. Physical Exam Vitals reviewed.  Constitutional:      Appearance: He is well-developed. He is obese. He is not ill-appearing.  HENT:     Head: Normocephalic and atraumatic.  Cardiovascular:     Rate and Rhythm: Normal rate and regular rhythm.     Heart sounds: No murmur heard.    No gallop.  Pulmonary:     Effort: Pulmonary effort is normal.     Breath sounds: Normal breath sounds. No wheezing, rhonchi or rales.  Abdominal:     General: Bowel sounds are normal.     Palpations: Abdomen is soft. There is no hepatomegaly, splenomegaly or mass.     Tenderness: There is abdominal tenderness. There is no guarding or rebound.     Comments: Tender in the suprapubic and right lower quadrant to palpation.  No rigidity is noted.  Skin:    General: Skin is warm and dry.  Neurological:     Mental Status: He is alert and oriented to person, place, and time.   CT scan images personally reviewed  Assessment/Plan Impression: Acute appendicitis, chronic anticoagulation for paroxysmal atrial fibrillation, obesity, hypertension, polycythemia vera Plan: Patient will be admitted to the hospital for IV antibiotics.  He will subsequently undergo a laparoscopic appendectomy  tomorrow as he has been on Eliquis.  The risks and benefits of the procedure including bleeding, infection, cardiopulmonary difficulties, blood clots, and the possibility of an open procedure were fully explained to the patient, who gave informed consent.  Aviva Signs, MD 12/21/2021, 7:29 AM

## 2021-12-21 NOTE — ED Triage Notes (Signed)
Pt states he woke up at about 11pm last night with abd pain (8/10), chest pressure (3/10) and shortness of breath.

## 2021-12-21 NOTE — ED Provider Notes (Addendum)
[facility] EMERGENCY DEPARTMENT Provider Note   CSN: [id number] Arrival date & time: 12/21/21  0400     History  Chief Complaint  Patient presents with   Shortness of Breath   Chest Pain    Albert [name] is a 51 y.o. male.  Patient is a 51 year old male with past medical history of paroxysmal atrial fibrillation on Eliquis, hypertension.  Patient presenting today with complaints of abdominal pain.  He was awakened from sleep at approximately 1 AM with a sudden onset of pain to the suprapubic region.  This pain radiates upward into his upper abdomen and chest.  He denies any shortness of breath, nausea, diaphoresis, or radiation to the arm or jaw.  He denies any bloody stool or vomit.  He was concerned he may be constipated and performed an enema at home with no relief.  The history is provided by the patient.       Home Medications Prior to Admission medications   Medication Sig Start Date End Date Taking? Authorizing Provider  acetaminophen (TYLENOL) 650 MG CR tablet Take 650 mg by mouth every 8 (eight) hours as needed for pain.    [provider]  clobetasol cream (TEMOVATE) 9.03 % Apply 1 application topically 2 (two) times daily as needed (itching).    [provider]  colchicine 0.6 MG tablet Take 0.6 mg by mouth 2 (two) times daily as needed (gout flare up). 05/16/19   [provider]  diltiazem (CARDIZEM) 30 MG tablet TAKE 1 TABLET EVERY 4 HOURS AS NEEDED FOR AFIB HEART RATE GREATER THAN 100 11/30/21   Fenton, Clint R, PA  ELIQUIS 5 MG TABS tablet TAKE 1 TABLET TWICE A DAY 07/28/21   Fenton, Clint R, PA  febuxostat (ULORIC) 40 MG tablet Take 40 mg by mouth daily.    [provider]  fluticasone (FLONASE) 50 MCG/ACT nasal spray Place 1 spray into both nostrils daily as needed for allergies or rhinitis.    [provider]  hydrocortisone 2.5 % ointment Apply topically 2 (two) times daily. Apply to temples for 4 to 6 weeks 06/13/21    Lavonna Monarch, MD  Magnesium 200 MG TABS Take 800 mg by mouth daily. Taking 4 tablets by mouth daily- '800mg'$  total    [provider]  meloxicam (MOBIC) 15 MG tablet Take 15 mg by mouth as needed for pain. 12/16/19   [provider]  metoprolol tartrate (LOPRESSOR) 25 MG tablet TAKE 1 TABLET TWICE A DAY 07/28/21   Fenton, Clint R, PA  MULTAQ 400 MG tablet TAKE 1 TABLET BY MOUTH 2 TIMES A DAY WITH MEALS. 08/29/21   Fenton, Clint R, PA  olopatadine (PATANOL) 0.1 % ophthalmic solution 1 drop daily.    [provider]  pantoprazole (PROTONIX) 40 MG tablet Take 1 tablet (40 mg total) by mouth daily. 09/24/19   Long, Wonda Olds, MD  Potassium 99 MG TABS Take 99 mg by mouth every evening.    [provider]  rosuvastatin (CRESTOR) 20 MG tablet Take 1 tablet (20 mg total) by mouth daily. 02/22/21   Frann Rider, NP  tadalafil (CIALIS) 20 MG tablet Take 20 mg by mouth daily as needed for erectile dysfunction.    [provider]  testosterone cypionate (DEPOTESTOSTERONE CYPIONATE) 200 MG/ML injection Inject 100 mg into the muscle every 14 (fourteen) days. Every 10 Days    [provider]  vitamin B-12 (CYANOCOBALAMIN) 100 MCG tablet Take 100 mcg by mouth daily.  [provider]      Allergies    Penicillins and Levaquin [levofloxacin]    Review of Systems   Review of Systems  All other systems reviewed and are negative.   Physical Exam Updated Vital Signs BP 135/73 (BP Location: Right Arm)   Pulse 62   Temp 98 F (36.7 C) (Oral)   Resp 20   Ht '5\' 11"'$  (1.803 m)   Wt 136.1 kg   SpO2 98%   BMI 41.84 kg/m  Physical Exam Vitals and nursing note reviewed.  Constitutional:      General: He is not in acute distress.    Appearance: He is well-developed. He is not diaphoretic.  HENT:     Head: Normocephalic and atraumatic.  Cardiovascular:     Rate and Rhythm: Normal rate and regular rhythm.     Heart sounds: No murmur heard.    No  friction rub.  Pulmonary:     Effort: Pulmonary effort is normal. No respiratory distress.     Breath sounds: Normal breath sounds. No wheezing or rales.  Abdominal:     General: Bowel sounds are normal. There is no distension.     Palpations: Abdomen is soft.     Tenderness: There is abdominal tenderness. There is no guarding or rebound.     Comments: There is tenderness to palpation in the suprapubic region.  Musculoskeletal:        General: Normal range of motion.     Cervical back: Normal range of motion and neck supple.  Skin:    General: Skin is warm and dry.  Neurological:     Mental Status: He is alert and oriented to person, place, and time.     Coordination: Coordination normal.     ED Results / Procedures / Treatments   Labs (all labs ordered are listed, but only abnormal results are displayed) Labs Reviewed  BASIC METABOLIC PANEL  CBC  LIPASE, BLOOD  TROPONIN I (HIGH SENSITIVITY)    EKG EKG Interpretation  Date/Time:  Thursday December 21 2021 04:17:08 EDT Ventricular Rate:  58 PR Interval:  204 QRS Duration: 109 QT Interval:  433 QTC Calculation: 426 R Axis:   -6 Text Interpretation: Sinus rhythm Borderline prolonged PR interval Probable anteroseptal infarct, old Baseline wander in lead(s) V1 V2 Confirmed by Veryl Speak [phone]) on 12/21/2021 4:24:15 AM  Radiology No results found.  Procedures Procedures    Medications Ordered in ED Medications - No data to display  ED Course/ Medical Decision Making/ A&P  This patient presents to the ED for concern of lower abdominal pain, this involves an extensive number of treatment options, and is a complaint that carries with it a high risk of complications and morbidity.  The differential diagnosis includes acute diverticulitis, acute appendicitis, small bowel obstruction   Co morbidities that complicate the patient evaluation  None   Additional history obtained:  Noted no history or external  records needed   Lab Tests:  I Ordered, and personally interpreted labs.  The pertinent results include: Mild leukocytosis with white count of 12,000.  Remainder of electrolyte panel and lipase unremarkable.   Imaging Studies ordered:  I ordered imaging studies including CT of the abdomen and pelvis I independently visualized and interpreted imaging which showed evidence concerning for acute appendicitis I agree with the radiologist interpretation   Cardiac Monitoring: / EKG:  The patient was maintained on a cardiac monitor.  I personally viewed and interpreted the cardiac monitored which showed an  underlying rhythm of: Sinus   Consultations Obtained:  I requested consultation with the general surgeon, Dr. Arnoldo Morale,  and discussed lab and imaging findings as well as pertinent plan - they recommend: He will evaluate in the ER   Problem List / ED Course / Critical interventions / Medication management  Patient presenting with complaints of lower abdominal pain that started in the early a.m. hours today.  His exam reveals tenderness in the suprapubic region.  I elected to obtain a CT scan of the abdomen and pelvis as the cause of his abdominal pain was somewhat unclear.  CT scan revealed evidence for acute appendicitis.  Patient will be evaluated in the ER by general surgery. He did describe discomfort radiating towards his chest, however symptoms extremely atypical for cardiac pain.  His EKG is unchanged showing a normal sinus rhythm and troponin is -4 hours after the onset of symptoms. I ordered medication including Toradol for pain and Rocephin and Flagyl for appendicitis Reevaluation of the patient after these medicines showed that the patient improved I have reviewed the patients home medicines and have made adjustments as needed   Social Determinants of Health:  None   Test / Admission - Considered:  Patient to be evaluated by general surgery here in the ER.  Disposition to  be determined.  Final Clinical Impression(s) / ED Diagnoses Final diagnoses:  None    Rx / DC Orders ED Discharge Orders     None         Veryl Speak, MD 12/21/21 6314    Veryl Speak, MD 12/21/21 [phone]

## 2021-12-22 ENCOUNTER — Observation Stay (HOSPITAL_COMMUNITY): Payer: BC Managed Care – PPO | Admitting: Anesthesiology

## 2021-12-22 ENCOUNTER — Encounter (HOSPITAL_COMMUNITY): Payer: Self-pay | Admitting: General Surgery

## 2021-12-22 ENCOUNTER — Other Ambulatory Visit: Payer: Self-pay

## 2021-12-22 ENCOUNTER — Encounter (HOSPITAL_COMMUNITY)
Admission: EM | Disposition: A | Discharge status: Home / Self Care | Payer: Self-pay | Source: Home / Self Care | Attending: Emergency Medicine

## 2021-12-22 DIAGNOSIS — R0789 Other chest pain: Secondary | ICD-10-CM | POA: Diagnosis present

## 2021-12-22 DIAGNOSIS — D45 Polycythemia vera: Secondary | ICD-10-CM | POA: Diagnosis present

## 2021-12-22 DIAGNOSIS — Z7901 Long term (current) use of anticoagulants: Secondary | ICD-10-CM | POA: Diagnosis not present

## 2021-12-22 DIAGNOSIS — K381 Appendicular concretions: Secondary | ICD-10-CM | POA: Diagnosis present

## 2021-12-22 DIAGNOSIS — M109 Gout, unspecified: Secondary | ICD-10-CM | POA: Diagnosis present

## 2021-12-22 DIAGNOSIS — K3580 Unspecified acute appendicitis: Secondary | ICD-10-CM | POA: Diagnosis present

## 2021-12-22 DIAGNOSIS — I480 Paroxysmal atrial fibrillation: Secondary | ICD-10-CM | POA: Diagnosis present

## 2021-12-22 DIAGNOSIS — I10 Essential (primary) hypertension: Secondary | ICD-10-CM | POA: Diagnosis present

## 2021-12-22 DIAGNOSIS — K353 Acute appendicitis with localized peritonitis, without perforation or gangrene: Secondary | ICD-10-CM | POA: Diagnosis not present

## 2021-12-22 DIAGNOSIS — E119 Type 2 diabetes mellitus without complications: Secondary | ICD-10-CM | POA: Diagnosis present

## 2021-12-22 DIAGNOSIS — Z79899 Other long term (current) drug therapy: Secondary | ICD-10-CM | POA: Diagnosis not present

## 2021-12-22 DIAGNOSIS — Z880 Allergy status to penicillin: Secondary | ICD-10-CM | POA: Diagnosis not present

## 2021-12-22 DIAGNOSIS — Z6841 Body Mass Index (BMI) 40.0 and over, adult: Secondary | ICD-10-CM | POA: Diagnosis not present

## 2021-12-22 DIAGNOSIS — K219 Gastro-esophageal reflux disease without esophagitis: Secondary | ICD-10-CM | POA: Diagnosis present

## 2021-12-22 DIAGNOSIS — Z881 Allergy status to other antibiotic agents status: Secondary | ICD-10-CM | POA: Diagnosis not present

## 2021-12-22 DIAGNOSIS — Z8673 Personal history of transient ischemic attack (TIA), and cerebral infarction without residual deficits: Secondary | ICD-10-CM | POA: Diagnosis not present

## 2021-12-22 HISTORY — PX: LAPAROSCOPIC APPENDECTOMY: SHX408

## 2021-12-22 LAB — GLUCOSE, CAPILLARY: Glucose-Capillary: 110 mg/dL — ABNORMAL HIGH (ref 70–99)

## 2021-12-22 LAB — SURGICAL PCR SCREEN
MRSA, PCR: NEGATIVE
Staphylococcus aureus: POSITIVE — AB

## 2021-12-22 LAB — HIV ANTIBODY (ROUTINE TESTING W REFLEX): HIV Screen 4th Generation wRfx: NONREACTIVE

## 2021-12-22 SURGERY — APPENDECTOMY, LAPAROSCOPIC
Anesthesia: General | Site: Abdomen

## 2021-12-22 MED ORDER — KETOROLAC TROMETHAMINE 30 MG/ML IJ SOLN
30.0000 mg | Freq: Four times a day (QID) | INTRAMUSCULAR | Status: AC
  Administered 2021-12-22: 30 mg via INTRAVENOUS

## 2021-12-22 MED ORDER — ROCURONIUM BROMIDE 100 MG/10ML IV SOLN
INTRAVENOUS | Status: DC | PRN
  Administered 2021-12-22: 60 mg via INTRAVENOUS

## 2021-12-22 MED ORDER — KETOROLAC TROMETHAMINE 30 MG/ML IJ SOLN
INTRAMUSCULAR | Status: AC
  Filled 2021-12-22: qty 1

## 2021-12-22 MED ORDER — ORAL CARE MOUTH RINSE: 15.0000 mL | Freq: Once | OROMUCOSAL | Status: AC

## 2021-12-22 MED ORDER — ONDANSETRON 4 MG PO TBDP: 4.0000 mg | ORAL_TABLET | Freq: Four times a day (QID) | ORAL | Status: DC | PRN

## 2021-12-22 MED ORDER — MIDAZOLAM HCL 5 MG/5ML IJ SOLN
INTRAMUSCULAR | Status: DC | PRN
  Administered 2021-12-22: 2 mg via INTRAVENOUS

## 2021-12-22 MED ORDER — SODIUM CHLORIDE 0.9 % IR SOLN
Status: DC | PRN
  Administered 2021-12-22: 1

## 2021-12-22 MED ORDER — OXYCODONE HCL 5 MG/5ML PO SOLN: 5.0000 mg | Freq: Once | ORAL | Status: DC | PRN

## 2021-12-22 MED ORDER — MUPIROCIN 2 % EX OINT
1.0000 | TOPICAL_OINTMENT | Freq: Two times a day (BID) | CUTANEOUS | Status: DC
  Administered 2021-12-22 – 2021-12-23 (×2): 1 via NASAL
  Filled 2021-12-22: qty 22

## 2021-12-22 MED ORDER — CHLORHEXIDINE GLUCONATE 0.12 % MT SOLN
15.0000 mL | Freq: Once | OROMUCOSAL | Status: AC
  Administered 2021-12-22: 15 mL via OROMUCOSAL

## 2021-12-22 MED ORDER — DEXAMETHASONE SODIUM PHOSPHATE 10 MG/ML IJ SOLN
INTRAMUSCULAR | Status: AC
  Filled 2021-12-22: qty 1

## 2021-12-22 MED ORDER — LACTATED RINGERS IV SOLN: INTRAVENOUS | Status: DC

## 2021-12-22 MED ORDER — BUPIVACAINE LIPOSOME 1.3 % IJ SUSP
INTRAMUSCULAR | Status: AC
  Filled 2021-12-22: qty 20

## 2021-12-22 MED ORDER — BUPIVACAINE LIPOSOME 1.3 % IJ SUSP
INTRAMUSCULAR | Status: DC | PRN
  Administered 2021-12-22: 20 mL

## 2021-12-22 MED ORDER — FEBUXOSTAT 40 MG PO TABS
40.0000 mg | ORAL_TABLET | Freq: Every day | ORAL | Status: DC
  Administered 2021-12-22 – 2021-12-23 (×2): 40 mg via ORAL
  Filled 2021-12-22 (×2): qty 1

## 2021-12-22 MED ORDER — HYDROMORPHONE HCL 1 MG/ML IJ SOLN: 1.0000 mg | INTRAMUSCULAR | Status: DC | PRN

## 2021-12-22 MED ORDER — ACETAMINOPHEN 650 MG RE SUPP: 650.0000 mg | Freq: Four times a day (QID) | RECTAL | Status: DC | PRN

## 2021-12-22 MED ORDER — PROPOFOL 10 MG/ML IV BOLUS
INTRAVENOUS | Status: DC | PRN
  Administered 2021-12-22: 200 mg via INTRAVENOUS

## 2021-12-22 MED ORDER — ONDANSETRON HCL 4 MG/2ML IJ SOLN
INTRAMUSCULAR | Status: AC
  Filled 2021-12-22: qty 2

## 2021-12-22 MED ORDER — DEXAMETHASONE SODIUM PHOSPHATE 10 MG/ML IJ SOLN
INTRAMUSCULAR | Status: DC | PRN
  Administered 2021-12-22: 4 mg via INTRAVENOUS

## 2021-12-22 MED ORDER — ACETAMINOPHEN 325 MG PO TABS: 650.0000 mg | ORAL_TABLET | Freq: Four times a day (QID) | ORAL | Status: DC | PRN

## 2021-12-22 MED ORDER — FENTANYL CITRATE (PF) 100 MCG/2ML IJ SOLN
INTRAMUSCULAR | Status: DC | PRN
  Administered 2021-12-22: 100 ug via INTRAVENOUS

## 2021-12-22 MED ORDER — MIDAZOLAM HCL 2 MG/2ML IJ SOLN
INTRAMUSCULAR | Status: AC
  Filled 2021-12-22: qty 2

## 2021-12-22 MED ORDER — OXYCODONE HCL 5 MG PO TABS
5.0000 mg | ORAL_TABLET | ORAL | Status: DC | PRN
  Administered 2021-12-22: 5 mg via ORAL
  Administered 2021-12-23: 10 mg via ORAL
  Filled 2021-12-22: qty 1
  Filled 2021-12-22: qty 2

## 2021-12-22 MED ORDER — SUGAMMADEX SODIUM 500 MG/5ML IV SOLN
INTRAVENOUS | Status: DC | PRN
  Administered 2021-12-22: 500 mg via INTRAVENOUS

## 2021-12-22 MED ORDER — APIXABAN 5 MG PO TABS
5.0000 mg | ORAL_TABLET | Freq: Two times a day (BID) | ORAL | Status: DC
  Administered 2021-12-22 – 2021-12-23 (×2): 5 mg via ORAL
  Filled 2021-12-22 (×2): qty 1

## 2021-12-22 MED ORDER — ONDANSETRON HCL 4 MG/2ML IJ SOLN: 4.0000 mg | Freq: Four times a day (QID) | INTRAMUSCULAR | Status: DC | PRN

## 2021-12-22 MED ORDER — PHENYLEPHRINE 80 MCG/ML (10ML) SYRINGE FOR IV PUSH (FOR BLOOD PRESSURE SUPPORT)
PREFILLED_SYRINGE | INTRAVENOUS | Status: AC
  Filled 2021-12-22: qty 10

## 2021-12-22 MED ORDER — KETOROLAC TROMETHAMINE 30 MG/ML IJ SOLN: 30.0000 mg | Freq: Four times a day (QID) | INTRAMUSCULAR | Status: DC | PRN

## 2021-12-22 MED ORDER — PHENYLEPHRINE HCL (PRESSORS) 10 MG/ML IV SOLN
INTRAVENOUS | Status: DC | PRN
  Administered 2021-12-22 (×4): 160 ug via INTRAVENOUS

## 2021-12-22 MED ORDER — METRONIDAZOLE 500 MG/100ML IV SOLN
500.0000 mg | Freq: Two times a day (BID) | INTRAVENOUS | Status: DC
  Administered 2021-12-22 – 2021-12-23 (×2): 500 mg via INTRAVENOUS
  Filled 2021-12-22 (×2): qty 100

## 2021-12-22 MED ORDER — LIDOCAINE HCL (CARDIAC) PF 100 MG/5ML IV SOSY
PREFILLED_SYRINGE | INTRAVENOUS | Status: DC | PRN
  Administered 2021-12-22: 100 mg via INTRAVENOUS

## 2021-12-22 MED ORDER — FENTANYL CITRATE (PF) 100 MCG/2ML IJ SOLN
INTRAMUSCULAR | Status: AC
  Filled 2021-12-22: qty 2

## 2021-12-22 MED ORDER — OXYCODONE HCL 5 MG PO TABS: 5.0000 mg | ORAL_TABLET | Freq: Once | ORAL | Status: DC | PRN

## 2021-12-22 MED ORDER — SODIUM CHLORIDE 0.9 % IV SOLN
2.0000 g | INTRAVENOUS | Status: DC
  Administered 2021-12-22: 2 g via INTRAVENOUS
  Filled 2021-12-22: qty 20

## 2021-12-22 MED ORDER — SIMETHICONE 80 MG PO CHEW: 40.0000 mg | CHEWABLE_TABLET | Freq: Four times a day (QID) | ORAL | Status: DC | PRN

## 2021-12-22 MED ORDER — FENTANYL CITRATE PF 50 MCG/ML IJ SOSY: 25.0000 ug | PREFILLED_SYRINGE | INTRAMUSCULAR | Status: DC | PRN

## 2021-12-22 MED ORDER — ONDANSETRON HCL 4 MG/2ML IJ SOLN: 4.0000 mg | Freq: Once | INTRAMUSCULAR | Status: DC | PRN

## 2021-12-22 SURGICAL SUPPLY — 48 items
ADH SKN CLS APL DERMABOND .7 (GAUZE/BANDAGES/DRESSINGS) ×1
ADH SKN CLS LQ APL DERMABOND (GAUZE/BANDAGES/DRESSINGS) ×1
APL PRP STRL LF DISP 70% ISPRP (MISCELLANEOUS) ×1
CHLORAPREP W/TINT 26 (MISCELLANEOUS) ×2 IMPLANT
CLOTH BEACON ORANGE TIMEOUT ST (SAFETY) ×2 IMPLANT
COVER LIGHT HANDLE STERIS (MISCELLANEOUS) ×4 IMPLANT
CUTTER FLEX LINEAR 45M (STAPLE) ×2 IMPLANT
DERMABOND ADVANCED .7 DNX12 (GAUZE/BANDAGES/DRESSINGS) ×2 IMPLANT
DERMABOND ADVANCED .7 DNX6 (GAUZE/BANDAGES/DRESSINGS) IMPLANT
ELECT REM PT RETURN 9FT ADLT (ELECTROSURGICAL) ×1
ELECTRODE REM PT RTRN 9FT ADLT (ELECTROSURGICAL) ×2 IMPLANT
GAUZE 4X4 16PLY ~~LOC~~+RFID DBL (SPONGE) IMPLANT
GLOVE BIOGEL PI IND STRL 7.0 (GLOVE) ×4 IMPLANT
GLOVE SURG SS PI 7.5 STRL IVOR (GLOVE) ×2 IMPLANT
GOWN STRL REUS W/TWL LRG LVL3 (GOWN DISPOSABLE) ×4 IMPLANT
INST SET LAPROSCOPIC AP (KITS) ×2 IMPLANT
KIT TURNOVER KIT A (KITS) ×2 IMPLANT
MANIFOLD NEPTUNE II (INSTRUMENTS) ×2 IMPLANT
NDL HYPO 18GX1.5 BLUNT FILL (NEEDLE) ×2 IMPLANT
NDL HYPO 21X1.5 SAFETY (NEEDLE) ×2 IMPLANT
NDL INSUFFLATION 14GA 120MM (NEEDLE) ×2 IMPLANT
NEEDLE HYPO 18GX1.5 BLUNT FILL (NEEDLE) ×1 IMPLANT
NEEDLE HYPO 21X1.5 SAFETY (NEEDLE) ×1 IMPLANT
NEEDLE INSUFFLATION 14GA 120MM (NEEDLE) ×1 IMPLANT
NS IRRIG 1000ML POUR BTL (IV SOLUTION) ×2 IMPLANT
PACK LAP CHOLE LZT030E (CUSTOM PROCEDURE TRAY) ×2 IMPLANT
PAD ARMBOARD 7.5X6 YLW CONV (MISCELLANEOUS) ×2 IMPLANT
PENCIL SMOKE EVACUATOR COATED (MISCELLANEOUS) IMPLANT
RELOAD 45 VASCULAR/THIN (ENDOMECHANICALS) IMPLANT
RELOAD STAPLE 45 2.5 WHT GRN (ENDOMECHANICALS) IMPLANT
RELOAD STAPLE 45 3.5 BLU ETS (ENDOMECHANICALS) IMPLANT
RELOAD STAPLE TA45 3.5 REG BLU (ENDOMECHANICALS) ×1 IMPLANT
SET BASIN LINEN APH (SET/KITS/TRAYS/PACK) ×2 IMPLANT
SET TUBE IRRIG SUCTION NO TIP (IRRIGATION / IRRIGATOR) ×2 IMPLANT
SET TUBE SMOKE EVAC HIGH FLOW (TUBING) ×2 IMPLANT
SHEARS HARMONIC ACE PLUS 36CM (ENDOMECHANICALS) ×2 IMPLANT
SUT MNCRL AB 4-0 PS2 18 (SUTURE) ×4 IMPLANT
SUT VICRYL 0 UR6 27IN ABS (SUTURE) ×2 IMPLANT
SYR 20ML LL LF (SYRINGE) ×4 IMPLANT
SYS BAG RETRIEVAL 10MM (BASKET) ×1
SYSTEM BAG RETRIEVAL 10MM (BASKET) ×2 IMPLANT
TRAY FOLEY W/BAG SLVR 16FR (SET/KITS/TRAYS/PACK) ×1
TRAY FOLEY W/BAG SLVR 16FR ST (SET/KITS/TRAYS/PACK) ×2 IMPLANT
TROCAR Z-THAD FIOS HNDL 12X100 (TROCAR) ×2 IMPLANT
TROCAR Z-THRD FIOS HNDL 11X100 (TROCAR) ×2 IMPLANT
TROCAR Z-THREAD FIOS 5X100MM (TROCAR) ×2 IMPLANT
TUBE CONNECTING 12X1/4 (SUCTIONS) ×2 IMPLANT
WARMER LAPAROSCOPE (MISCELLANEOUS) ×2 IMPLANT

## 2021-12-22 NOTE — Transfer of Care (Signed)
Immediate Anesthesia Transfer of Care Note  Patient: [name]  Procedure(s) Performed: APPENDECTOMY LAPAROSCOPIC (Abdomen)  Patient Location: PACU  Anesthesia Type:General  Level of Consciousness: awake, alert , oriented and patient cooperative  Airway & Oxygen Therapy: Patient Spontanous Breathing and Patient connected to face mask oxygen  Post-op Assessment: Report given to RN, Post -op Vital signs reviewed and stable and Patient moving all extremities X 4  Post vital signs: Reviewed and stable  Last Vitals:  Vitals Value Taken Time  BP    Temp    Pulse 84 12/22/21 1053  Resp 23 12/22/21 1053  SpO2 96 % 12/22/21 1053  Vitals shown include unvalidated device data.  Last Pain:  Vitals:   12/22/21 0821  TempSrc:   PainSc: 0-No pain         Complications:  Encounter Notable Events  Notable Event Outcome Phase Comment  Difficult to intubate - expected  Intraprocedure Filed from anesthesia note documentation.

## 2021-12-22 NOTE — Anesthesia Preprocedure Evaluation (Signed)
Anesthesia Evaluation  Patient identified by MRN, date of birth, ID band Patient awake    Reviewed: Allergy & Precautions, H&P , NPO status , Patient's Chart, lab work & pertinent test results, reviewed documented beta blocker date and time   Airway Mallampati: II  TM Distance: >3 FB Neck ROM: full    Dental no notable dental hx.    Pulmonary sleep apnea ,    Pulmonary exam normal breath sounds clear to auscultation       Cardiovascular Exercise Tolerance: Good hypertension, negative cardio ROS   Rhythm:regular Rate:Normal     Neuro/Psych CVA negative psych ROS   GI/Hepatic Neg liver ROS, GERD  Medicated,  Endo/Other  negative endocrine ROSdiabetes, Well Controlled, Type 2  Renal/GU negative Renal ROS  negative genitourinary   Musculoskeletal   Abdominal   Peds  Hematology negative hematology ROS (+)   Anesthesia Other Findings   Reproductive/Obstetrics negative OB ROS                             Anesthesia Physical Anesthesia Plan  ASA: 3  Anesthesia Plan: General and General ETT   Post-op Pain Management:    Induction:   PONV Risk Score and Plan: Ondansetron  Airway Management Planned:   Additional Equipment:   Intra-op Plan:   Post-operative Plan:   Informed Consent: I have reviewed the patients History and Physical, chart, labs and discussed the procedure including the risks, benefits and alternatives for the proposed anesthesia with the patient or authorized representative who has indicated his/her understanding and acceptance.     Dental Advisory Given  Plan Discussed with: CRNA  Anesthesia Plan Comments:         Anesthesia Quick Evaluation

## 2021-12-22 NOTE — Interval H&P Note (Signed)
History and Physical Interval Note:  12/22/2021 8:30 AM  Albert [name]  has presented today for surgery, with the diagnosis of acute appendicitis.  The various methods of treatment have been discussed with the patient and family. After consideration of risks, benefits and other options for treatment, the patient has consented to  Procedure(s): APPENDECTOMY LAPAROSCOPIC (N/A) as a surgical intervention.  The patient's history has been reviewed, patient examined, no change in status, stable for surgery.  I have reviewed the patient's chart and labs.  Questions were answered to the patient's satisfaction.     Aviva Signs

## 2021-12-22 NOTE — Anesthesia Procedure Notes (Signed)
Procedure Name: Intubation Date/Time: 12/22/2021 10:11 AM  Performed by: Jonna Munro, CRNAPre-anesthesia Checklist: Patient identified, Emergency Drugs available, Suction available, Patient being monitored and Timeout performed Patient Re-evaluated:Patient Re-evaluated prior to induction Oxygen Delivery Method: Circle system utilized Preoxygenation: Pre-oxygenation with 100% oxygen Induction Type: IV induction Ventilation: Mask ventilation without difficulty Laryngoscope Size: Glidescope and 4 Grade View: Grade I Tube type: Oral Tube size: 7.5 mm Number of attempts: 1 Airway Equipment and Method: Rigid stylet and Video-laryngoscopy Secured at: 23 cm Tube secured with: Tape Dental Injury: Teeth and Oropharynx as per pre-operative assessment  Difficulty Due To: Difficulty was anticipated and Difficult Airway- due to large tongue

## 2021-12-22 NOTE — Anesthesia Postprocedure Evaluation (Signed)
Anesthesia Post Note  Patient: Albert [name]  Procedure(s) Performed: APPENDECTOMY LAPAROSCOPIC (Abdomen)  Patient location during evaluation: Phase II Anesthesia Type: General Level of consciousness: awake Pain management: pain level controlled Vital Signs Assessment: post-procedure vital signs reviewed and stable Respiratory status: spontaneous breathing and respiratory function stable Cardiovascular status: blood pressure returned to baseline and stable Postop Assessment: no headache and no apparent nausea or vomiting Anesthetic complications: yes Comments: Late entry   Encounter Notable Events  Notable Event Outcome Phase Comment  Difficult to intubate - expected  Intraprocedure Filed from anesthesia note documentation.     Last Vitals:  Vitals:   12/22/21 1115 12/22/21 1144  BP: 123/66 122/80  Pulse:  86  Resp: 17 18  Temp:  36.8 C  SpO2: 94% 96%    Last Pain:  Vitals:   12/22/21 1144  TempSrc: Oral  PainSc:                  Louann Sjogren

## 2021-12-23 LAB — CBC
HCT: 46.5 % (ref 39.0–52.0)
Hemoglobin: 15.6 g/dL (ref 13.0–17.0)
MCH: 31.2 pg (ref 26.0–34.0)
MCHC: 33.5 g/dL (ref 30.0–36.0)
MCV: 93 fL (ref 80.0–100.0)
Platelets: 186 10*3/uL (ref 150–400)
RBC: 5 MIL/uL (ref 4.22–5.81)
RDW: 12.6 % (ref 11.5–15.5)
WBC: 10.2 10*3/uL (ref 4.0–10.5)
nRBC: 0 % (ref 0.0–0.2)

## 2021-12-23 LAB — BASIC METABOLIC PANEL
Anion gap: 8 (ref 5–15)
BUN: 21 mg/dL — ABNORMAL HIGH (ref 6–20)
CO2: 26 mmol/L (ref 22–32)
Calcium: 8.5 mg/dL — ABNORMAL LOW (ref 8.9–10.3)
Chloride: 103 mmol/L (ref 98–111)
Creatinine, Ser: 1.4 mg/dL — ABNORMAL HIGH (ref 0.61–1.24)
GFR, Estimated: >60 mL/min (ref >60)
Glucose, Bld: 108 mg/dL — ABNORMAL HIGH (ref 70–99)
Potassium: 4.3 mmol/L (ref 3.5–5.1)
Sodium: 137 mmol/L (ref 135–145)

## 2021-12-23 MED ORDER — OXYCODONE HCL 5 MG PO TABS: 5.0000 mg | ORAL_TABLET | ORAL | 0 refills | Status: DC | PRN

## 2021-12-23 NOTE — Op Note (Signed)
Patient:  Albert [name]  DOB:  [birth year]  MRN:  [id number]   Preop Diagnosis: Acute appendicitis  Postop Diagnosis: Same  Procedure: Laparoscopic appendectomy  Surgeon: Aviva Signs, MD  Anes: General endotracheal  Indications: Patient is a 51 year old white male who presented to the emergency room with worsening lower abdominal pain.  CT scan of the abdomen revealed acute appendicitis.  The surgery was delayed as the patient is on Eliquis.  The risks and benefits of the procedure including bleeding, infection, and the possibility of an open procedure were fully explained to the patient, who gave informed consent.  Procedure note: The patient was placed in the supine position.  After induction of general endotracheal anesthesia, the abdomen was prepped and draped using the usual sterile technique with ChloraPrep.  Surgical site confirmation was performed.  A supraumbilical incision was made down to the fascia.  A Veress needle was introduced into the abdominal cavity and confirmation of placement was done using the saline drop test.  The abdomen was then insufflated to 15 mmHg pressure.  An 11 mm trocar was introduced into the abdominal cavity under direct visualization without difficulty.  Patient was placed in deeper Trendelenburg position and an additional 12 mm trocar was placed in the suprapubic region and a 5 mm trocar was placed in the left lower quadrant region.  The appendix was visualized and noted to be acutely inflamed.  The mesial appendix was divided using the harmonic scalpel.  A standard load Endo GIA was placed across the base the appendix and fired.  The appendix was then removed using an Endo Catch bag without difficulty.  The staple line was inspected and no abnormal bleeding was noted.  All fluid and air were then evacuated from the abdominal cavity prior to the removal of the trocars.  All wounds were irrigated with normal saline.  All wounds were injected with  Exparel.  The supraumbilical fascia was reapproximated using an 0 Vicryl interrupted suture.  All skin incisions were closed using a 4-0 Monocryl subcuticular suture.  Dermabond was applied.  All tape and needle counts were correct at the end of the procedure.  Patient was extubated in the operating room and transferred to PACU in stable condition.  Complications: None  EBL: Minimal  Specimen: Appendix

## 2021-12-23 NOTE — Discharge Summary (Signed)
Physician Discharge Summary  Patient ID: Albert [name] MRN: [id number] DOB/AGE: [birth year] 51 y.o.  Admit date: 12/21/2021 Discharge date: 12/23/2021  Admission Diagnoses: Acute appendicitis  Discharge Diagnoses: Same Principal Problem:   Acute appendicitis Paroxsymal atrial fibrillation, morbid obesity, gout  Discharged Condition: good  Hospital Course: Patient is a 51yo WM who presented to the ER with lower abdominal pain.  CT scan of the abdomen revealed acute appendicitis.  As patient is on Eliquis, surgery was delayed until 12/22/21.  Patient underwent a laparoscopic appendectomy.  He tolerated the surgery well.  His postoperative course was unremarkable.  His diet was advanced out difficulty.  He was started on his Eliquis that evening.  The patient is being discharged home on 12/23/2021 in good and improving condition.  Treatments: surgery: Laparoscopic appendectomy on 12/22/21  Discharge Exam: Blood pressure (!) 146/92, pulse (!) 59, temperature (!) 97.5 F (36.4 C), temperature source Oral, resp. rate 18, height '5\' 11"'$  (1.803 m), weight (!) 143.6 kg, SpO2 99 %. General appearance: alert, cooperative, and no distress Resp: clear to auscultation bilaterally Cardio: regular rate and rhythm, S1, S2 normal, no murmur, click, rub or gallop GI: soft, incisions healing well.  Disposition: Discharge disposition: 01-Home or Self Care       Discharge Instructions     Diet - low sodium heart healthy   Complete by: As directed    Increase activity slowly   Complete by: As directed       Allergies as of 12/23/2021       Reactions   Penicillins Other (See Comments)   Unknown childhood   Levaquin [levofloxacin] Palpitations        Medication List     STOP taking these medications    colchicine 0.6 MG tablet   hydrocortisone 2.5 % ointment       TAKE these medications    acetaminophen 650 MG CR tablet Commonly known as: TYLENOL Take 650 mg by mouth every  8 (eight) hours as needed for pain.   clobetasol cream 0.05 % Commonly known as: TEMOVATE Apply 1 application topically 2 (two) times daily as needed (itching).   diltiazem 30 MG tablet Commonly known as: CARDIZEM TAKE 1 TABLET EVERY 4 HOURS AS NEEDED FOR AFIB HEART RATE GREATER THAN 100   Eliquis 5 MG Tabs tablet Generic drug: apixaban TAKE 1 TABLET TWICE A DAY   febuxostat 40 MG tablet Commonly known as: ULORIC Take 40 mg by mouth daily.   fluticasone 50 MCG/ACT nasal spray Commonly known as: FLONASE Place 1 spray into both nostrils daily as needed for allergies or rhinitis.   Magnesium 200 MG Tabs Take 800 mg by mouth daily. Taking 4 tablets by mouth daily- '800mg'$  total   meloxicam 15 MG tablet Commonly known as: MOBIC Take 15 mg by mouth as needed for pain.   metoprolol tartrate 25 MG tablet Commonly known as: LOPRESSOR TAKE 1 TABLET TWICE A DAY   Multaq 400 MG tablet Generic drug: dronedarone TAKE 1 TABLET BY MOUTH 2 TIMES A DAY WITH MEALS.   oxyCODONE 5 MG immediate release tablet Commonly known as: Roxicodone Take 1 tablet (5 mg total) by mouth every 4 (four) hours as needed.   pantoprazole 40 MG tablet Commonly known as: PROTONIX Take 1 tablet (40 mg total) by mouth daily.   Potassium 99 MG Tabs Take 99 mg by mouth every evening.   rosuvastatin 20 MG tablet Commonly known as: Crestor Take 1 tablet (20 mg total) by mouth daily.  tadalafil 20 MG tablet Commonly known as: CIALIS Take 20 mg by mouth daily as needed for erectile dysfunction.   testosterone cypionate 200 MG/ML injection Commonly known as: DEPOTESTOSTERONE CYPIONATE Inject 100 mg into the muscle every 14 (fourteen) days. Every 10 Days   vitamin B-12 100 MCG tablet Commonly known as: CYANOCOBALAMIN Take 100 mcg by mouth daily.        Follow-up Information     Aviva Signs, MD Follow up.   Specialty: General Surgery Why: As needed.  Will call you next week for follow up. Contact  information: [address] [place] [address] [phone]                 Signed: Aviva Signs 12/23/2021, 10:24 AM

## 2021-12-25 LAB — SURGICAL PATHOLOGY

## 2021-12-28 ENCOUNTER — Inpatient Hospital Stay: Payer: BC Managed Care – PPO

## 2021-12-28 ENCOUNTER — Ambulatory Visit (INDEPENDENT_AMBULATORY_CARE_PROVIDER_SITE_OTHER): Payer: BC Managed Care – PPO | Admitting: General Surgery

## 2021-12-28 ENCOUNTER — Inpatient Hospital Stay: Payer: BC Managed Care – PPO | Admitting: Physician Assistant

## 2021-12-28 ENCOUNTER — Encounter: Payer: Self-pay | Admitting: General Surgery

## 2021-12-28 DIAGNOSIS — Z09 Encounter for follow-up examination after completed treatment for conditions other than malignant neoplasm: Secondary | ICD-10-CM

## 2021-12-28 NOTE — Addendum Note (Signed)
Addendum  created 12/28/21 1048 by Jonna Munro, CRNA   Intraprocedure Staff edited

## 2021-12-28 NOTE — Progress Notes (Signed)
Subjective:     Albert [name]  Virtual telephone postoperative visit performed with patient.  He states he is doing very well.  He did go back on his Eliquis and has not had any significant ecchymosis develop.  He is pleased with the results. Objective:    There were no vitals taken for this visit.  General:  alert  Final pathology consistent with diagnosis.  Patient is aware.     Assessment:    Doing well postoperatively.    Plan:   Patient was instructed to call me should any problems arise.  May resume normal activity.  Follow-up here as needed.

## 2022-01-26 ENCOUNTER — Telehealth: Payer: Self-pay | Admitting: *Deleted

## 2022-01-26 NOTE — Telephone Encounter (Signed)
Received VM from [place] with [employer]~ [facility]- [place].  Requested retroactive authorization for dates of service 12/21/2021- 12/22/2021.  Noted that patient was admitted to [facility] on 12/21/2021 with acute apendicitis that required surgical intervention.   Surgical Date: 12/22/2021 Procedure: Lap Appendectomy Provider: Dr. Aviva Signs

## 2022-01-27 NOTE — Progress Notes (Unsigned)
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  He was last seen by Tarri Abernethy PA-C on 05/18/2021.  He has not required phlebotomy since 01/23/2021 due to Hgb/HCT at goal.  At today's visit, he reports feeling well.  He was hospitalized for acute appendicitis with appendectomy performed on 12/22/2021.  Otherwise, he has not had any hospitalizations or changes in his baseline health status.  No fatigue, fever, chills, shortness of breath, cough, chest pain, nausea, vomiting.  He continues to remain on testosterone 100 g every 10 days.  He continues to be compliant with his CPAP machine.   No strokelike symptoms since his TIA in September 2022.   No history of blood clots or symptoms of current DVT/PE.   No pruritus, Raynaud's phenomenon, or erythromelalgia.   No vasomotor symptoms such as dizziness, tinnitus, blurry vision, headache, neuropathy, or strokelike symptoms.   No B symptoms, abdominal pain satiety.   He remains on Eliquis for atrial fibrillation.  He denies any major bleeding events such as rectal bleeding or melena. He has 100% energy and 100% appetite. He endorses that he is maintaining a stable weight.   REVIEW OF SYSTEMS: Patient offers no complaints at today's visit Review of Systems  Constitutional:  Negative for appetite change, chills, diaphoresis, fatigue, fever and unexpected weight change.  HENT:   Negative for lump/mass and nosebleeds.   Eyes:  Negative for eye problems.  Respiratory:  Negative for cough, hemoptysis and shortness of breath.   Cardiovascular:   Negative for chest pain, leg swelling and palpitations.  Gastrointestinal:  Negative for abdominal pain, blood in stool, constipation, diarrhea, nausea and vomiting.  Genitourinary:  Negative for hematuria.   Skin: Negative.   Neurological:  Negative for dizziness, headaches and light-headedness.  Hematological:  Does not bruise/bleed easily.      PAST MEDICAL/SURGICAL HISTORY:  Past Medical History:  Diagnosis Date   Atrial fibrillation ([place])    CVA (cerebral vascular accident) ([place]) 2022   Diabetes mellitus without complication ([place])    GERD (gastroesophageal reflux disease)    Gout    Hypertension    Low testosterone    Polycythemia, 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   Sleep apnea    Past Surgical History:  Procedure Laterality Date   CARDIAC CATHETERIZATION     COLONOSCOPY     COLONOSCOPY N/A 05/11/2015   Procedure: COLONOSCOPY;  Surgeon: Rogene Houston, MD;  Location: [facility];  Service: Endoscopy;  Laterality: N/A;  930 - moved to 2/15 - Ann to notify   COLONOSCOPY WITH PROPOFOL N/A 09/07/2020   Procedure: COLONOSCOPY WITH PROPOFOL;  Surgeon: Rogene Houston, MD;  Location: [facility];  Service: Endoscopy;  Laterality: N/A;  am   LAPAROSCOPIC APPENDECTOMY N/A 12/22/2021   Procedure: APPENDECTOMY LAPAROSCOPIC;  Surgeon: Aviva Signs, MD;  Location: [facility] ORS;  Service: General;  Laterality: N/A;   POLYPECTOMY  09/07/2020   Procedure: POLYPECTOMY;  Surgeon: Rogene Houston, MD;  Location: [facility];  Service: Endoscopy;;     SOCIAL HISTORY:  Social History   Socioeconomic History   Marital status: Married    Spouse name: Albert [name]   Number  of children: Not on file   Years of education: Not on file   Highest education level: Master's degree (e.g., MA, MS, MEng, MEd, MSW, MBA)  Occupational History    Comment: accounting  Tobacco Use   Smoking status: Never   Smokeless tobacco: Never  Vaping Use   Vaping Use: Never used  Substance and Sexual Activity   Alcohol use: Not  Currently    Comment: occasionally   Drug use: No    Comment: reports use of CBG gummies   Sexual activity: Not on file  Other Topics Concern   Not on file  Social History Narrative   Lives with wife   Social Determinants of Health   Financial Resource Strain: Not on file  Food Insecurity: No Food Insecurity (12/21/2021)   Hunger Vital Sign    Worried About Running Out of Food in the Last Year: Never true    Ran Out of Food in the Last Year: Never true  Transportation Needs: No Transportation Needs (12/21/2021)   PRAPARE - Hydrologist (Medical): No    Lack of Transportation (Non-Medical): No  Physical Activity: Not on file  Stress: Not on file  Social Connections: Not on file  Intimate Partner Violence: Not At Risk (12/21/2021)   Humiliation, Afraid, Rape, and Kick questionnaire    Fear of Current or Ex-Partner: No    Emotionally Abused: No    Physically Abused: No    Sexually Abused: No    FAMILY HISTORY:  Family History  Problem Relation Age of Onset   Colon cancer Father     CURRENT MEDICATIONS:  Outpatient Encounter Medications as of 01/29/2022  Medication Sig   acetaminophen (TYLENOL) 650 MG CR tablet Take 650 mg by mouth every 8 (eight) hours as needed for pain.   clobetasol cream (TEMOVATE) 0.03 % Apply 1 application topically 2 (two) times daily as needed (itching).   diltiazem (CARDIZEM) 30 MG tablet TAKE 1 TABLET EVERY 4 HOURS AS NEEDED FOR AFIB HEART RATE GREATER THAN 100   ELIQUIS 5 MG TABS tablet TAKE 1 TABLET TWICE A DAY   febuxostat (ULORIC) 40 MG tablet Take 40 mg by mouth daily.   fluticasone (FLONASE) 50 MCG/ACT nasal spray Place 1 spray into both nostrils daily as needed for allergies or rhinitis.   Magnesium 200 MG TABS Take 800 mg by mouth daily. Taking 4 tablets by mouth daily- '800mg'$  total   meloxicam (MOBIC) 15 MG tablet Take 15 mg by mouth as needed for pain.   metoprolol tartrate (LOPRESSOR) 25 MG tablet TAKE 1 TABLET  TWICE A DAY   MULTAQ 400 MG tablet TAKE 1 TABLET BY MOUTH 2 TIMES A DAY WITH MEALS.   oxyCODONE (ROXICODONE) 5 MG immediate release tablet Take 1 tablet (5 mg total) by mouth every 4 (four) hours as needed.   pantoprazole (PROTONIX) 40 MG tablet Take 1 tablet (40 mg total) by mouth daily.   Potassium 99 MG TABS Take 99 mg by mouth every evening.   rosuvastatin (CRESTOR) 20 MG tablet Take 1 tablet (20 mg total) by mouth daily. (Patient not taking: Reported on 12/21/2021)   tadalafil (CIALIS) 20 MG tablet Take 20 mg by mouth daily as needed for erectile dysfunction.   testosterone cypionate (DEPOTESTOSTERONE CYPIONATE) 200 MG/ML injection Inject 100 mg into the muscle every 14 (fourteen) days. Every 10 Days (Patient not taking: Reported on 12/21/2021)   vitamin B-12 (CYANOCOBALAMIN) 100 MCG tablet Take 100 mcg by mouth daily.  No facility-administered encounter medications on file as of 01/29/2022.    ALLERGIES:  Allergies  Allergen Reactions   Penicillins Other (See Comments)    Unknown childhood   Levaquin [Levofloxacin] Palpitations     PHYSICAL EXAM:  ECOG PERFORMANCE STATUS: 0 - Asymptomatic  There were no vitals filed for this visit. There were no vitals filed for this visit. Physical Exam Constitutional:      Appearance: Normal appearance. He is well-groomed. He is morbidly obese.  HENT:     Head: Normocephalic and atraumatic.     Mouth/Throat:     Mouth: Mucous membranes are moist.  Eyes:     Extraocular Movements: Extraocular movements intact.     Pupils: Pupils are equal, round, and reactive to light.  Cardiovascular:     Rate and Rhythm: Normal rate and regular rhythm.     Pulses: Normal pulses.     Heart sounds: Normal heart sounds.  Pulmonary:     Effort: Pulmonary effort is normal.     Breath sounds: Normal breath sounds.  Abdominal:     General: Bowel sounds are normal.     Palpations: Abdomen is soft.     Tenderness: There is no abdominal tenderness.   Musculoskeletal:        General: No swelling.     Right lower leg: No edema.     Left lower leg: No edema.  Lymphadenopathy:     Cervical: No cervical adenopathy.  Skin:    General: Skin is warm and dry.  Neurological:     General: No focal deficit present.     Mental Status: He is alert and oriented to person, place, and time.  Psychiatric:        Mood and Affect: Mood normal.        Behavior: Behavior normal.      LABORATORY DATA:  I have reviewed the labs as listed.  CBC    Component Value Date/Time   WBC 10.2 12/23/2021 0513   RBC 5.00 12/23/2021 0513   HGB 15.6 12/23/2021 0513   HCT 46.5 12/23/2021 0513   PLT 186 12/23/2021 0513   MCV 93.0 12/23/2021 0513   MCH 31.2 12/23/2021 0513   MCHC 33.5 12/23/2021 0513   RDW 12.6 12/23/2021 0513   LYMPHSABS 2.2 05/18/2021 1146   MONOABS 0.9 05/18/2021 1146   EOSABS 0.4 05/18/2021 1146   BASOSABS 0.1 05/18/2021 1146      Latest Ref Rng & Units 12/23/2021    5:13 AM 12/21/2021    4:16 AM 12/12/2020   10:54 PM  CMP  Glucose 70 - 99 mg/dL 108  149  120   BUN 6 - 20 mg/dL 21  35  36   Creatinine 0.61 - 1.24 mg/dL 1.40  1.52  1.52   Sodium 135 - 145 mmol/L 137  132  138   Potassium 3.5 - 5.1 mmol/L 4.3  4.2  4.0   Chloride 98 - 111 mmol/L 103  100  106   CO2 22 - 32 mmol/L '26  23  25   '$ Calcium 8.9 - 10.3 mg/dL 8.5  8.9  9.2     DIAGNOSTIC IMAGING:  I have independently reviewed the relevant imaging and discussed with the patient.  ASSESSMENT & PLAN: 1.  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  He was previously taking testosterone 200 mg every 10 days, but the dose was decreased by his urologist after he had  onset of polycythemia.  Ever since dose was decreased, his blood counts have been within target range. - Patient has OSA, is compliant with CPAP - No history of DVT or PE.  Suspected TIA on 11/28/2020 (patient  questions diagnosis of TIA, thinks that his symptoms may have been due to [place] that he was taking to help him sleep at night) - MPN work-up negative for any mutations of JAK2, CALR, MPL. - Erythropoietin was mildly elevated, and renal ultrasound (02/23/2021) showed 2.7 cm hypoechoic mass in right kidney.  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 - He is on Eliquis due to atrial fibrillation.  He does not take aspirin. - Requires intermittent phlebotomy, Hgb/HCT has been at goal since last phlebotomy on 01/23/2021. - Currently asymptomatic.  No aquagenic pruritus or vasomotor symptoms. - CBC today (01/29/2022): Hgb 16.8/HCT 48.2, normal CBC. - PLAN:  Labs (CBC only) only in 6 months, phlebotomy if HCT > 52 - Labs (CBC only) and office visit in 1 year **Patient is planning to speak with his urologist about increased dose of testosterone.  From hematology standpoint, that is fine.  We can adjust his phlebotomy to accommodate for any increased testosterone.  Patient instructed to contact our office if he goes back on high-dose testosterone, and we will adjust follow-up schedule to include labs/possible phlebotomy in 3 months, with office visit in 6 months.  2.  Other history - He is on Eliquis for atrial fibrillation. - Most recent colonoscopy 09/07/2020 with multiple polyps and external and internal hemorrhoids. - He lives at home with his wife.  He is a Investment banker, operational.  He is a lifelong non-smoker. - No family history of polycythemia.  Father had colon cancer in his mid 61s.  Mother had cervical cancer in her 34s.  Maternal grandfather had bladder cancer.   PLAN SUMMARY & DISPOSITION: Labs (CBC) and possible phlebotomy in 6 months (if HCT >52) Same-day labs (CBC), office visit, possible phlebotomy in 1 year  All questions were answered. The patient  knows to call the clinic with any problems, questions or concerns.  Medical decision making: Low  Time spent on visit: I spent 15 minutes counseling the patient face to face. The total time spent in the appointment was 22 minutes and more than 50% was on counseling.   Harriett Rush, PA-C  01/29/2022 1:45 PM

## 2022-01-29 ENCOUNTER — Inpatient Hospital Stay: Payer: BC Managed Care – PPO | Attending: Physician Assistant | Admitting: Physician Assistant

## 2022-01-29 ENCOUNTER — Encounter: Payer: Self-pay | Admitting: Physician Assistant

## 2022-01-29 ENCOUNTER — Inpatient Hospital Stay: Payer: BC Managed Care – PPO

## 2022-01-29 VITALS — BP 137/71 | HR 61 | Temp 98.1°F | Wt 317.0 lb

## 2022-01-29 DIAGNOSIS — D751 Secondary polycythemia: Secondary | ICD-10-CM

## 2022-01-29 DIAGNOSIS — I4891 Unspecified atrial fibrillation: Secondary | ICD-10-CM | POA: Insufficient documentation

## 2022-01-29 DIAGNOSIS — Z79899 Other long term (current) drug therapy: Secondary | ICD-10-CM | POA: Diagnosis not present

## 2022-01-29 DIAGNOSIS — Z7901 Long term (current) use of anticoagulants: Secondary | ICD-10-CM | POA: Insufficient documentation

## 2022-01-29 DIAGNOSIS — Z800 Family history of malignant neoplasm of digestive organs: Secondary | ICD-10-CM | POA: Insufficient documentation

## 2022-01-29 LAB — CBC
HCT: 48.2 % (ref 39.0–52.0)
Hemoglobin: 16.8 g/dL (ref 13.0–17.0)
MCH: 31.8 pg (ref 26.0–34.0)
MCHC: 34.9 g/dL (ref 30.0–36.0)
MCV: 91.3 fL (ref 80.0–100.0)
Platelets: 197 10*3/uL (ref 150–400)
RBC: 5.28 MIL/uL (ref 4.22–5.81)
RDW: 13 % (ref 11.5–15.5)
WBC: 7.3 10*3/uL (ref 4.0–10.5)
nRBC: 0 % (ref 0.0–0.2)

## 2022-01-29 NOTE — Patient Instructions (Signed)
[place] at [facility] Discharge Instructions  You were seen today by Tarri Abernethy PA-C for your elevated hemoglobin/elevated hematocrit.  These levels are most likely elevated due to your testosterone supplement.  Your blood levels have looked much better after your testosterone dose was decreased.  You do not need any phlebotomy at this time.  We will recheck your labs in 6 months and schedule you for possible phlebotomy if needed, with plans for an office visit in 1 year.  HOWEVER, if you go back on an increased dose of testosterone, we will check labs in 3 months and see you for a visit in 6 months.  As we have discussed, your elevated blood counts can place you at increased risk for heart attack, stroke, and blood clots.   ** Thank you for trusting me with your healthcare!  I strive to provide all of my patients with quality care at each visit.  If you receive a survey for this visit, I would be so grateful to you for taking the time to provide feedback.  Thank you in advance!  ~ Etrulia Zarr                   Dr. Derek Jack   &   Tarri Abernethy, PA-C   - - - - - - - - - - - - - - - - - -     Thank you for choosing [place] at [facility] to provide your oncology and hematology care.  To afford each patient quality time with our provider, please arrive at least 15 minutes before your scheduled appointment time.   If you have a lab appointment with the [facility] please come in thru the Main Entrance and check in at the main information desk.  You need to re-schedule your appointment should you arrive 10 or more minutes late.  We strive to give you quality time with our providers, and arriving late affects you and other patients whose appointments are after yours.  Also, if you no show three or more times for appointments you may be dismissed from the clinic at the providers discretion.     Again, thank you for choosing  [facility].  Our hope is that these requests will decrease the amount of time that you wait before being seen by our physicians.       _____________________________________________________________  Should you have questions after your visit to [facility], please contact our office at [phone] and follow the prompts.  Our office hours are 8:00 a.m. and 4:30 p.m. Monday - Friday.  Please note that voicemails left after 4:00 p.m. may not be returned until the following business day.  We are closed weekends and major holidays.  You do have access to a nurse 24-7, just call the main number to the clinic [phone] and do not press any options, hold on the line and a nurse will answer the phone.    For prescription refill requests, have your pharmacy contact our office and allow 72 hours.    Due to Covid, you will need to wear a mask upon entering the hospital. If you do not have a mask, a mask will be given to you at the Main Entrance upon arrival. For doctor visits, patients may have 1 support person age 73 or older with them. For treatment visits, patients can not have anyone with them due to social distancing guidelines  and our immunocompromised population.

## 2022-01-29 NOTE — Progress Notes (Signed)
No Phlebotomy today per order parameters.

## 2022-01-29 NOTE — Telephone Encounter (Signed)
Discussed with [facility], billing. Claim re-submitted.   Call placed to [employer]. Advised to contact [place] [phone]~ telephone for further assistance with claim.

## 2022-02-13 ENCOUNTER — Encounter (HOSPITAL_COMMUNITY): Payer: Self-pay | Admitting: Physician Assistant

## 2022-02-13 ENCOUNTER — Ambulatory Visit (HOSPITAL_COMMUNITY)
Admission: RE | Admit: 2022-02-13 | Discharge: 2022-02-13 | Disposition: A | Discharge status: Home / Self Care | Payer: BC Managed Care – PPO | Source: Ambulatory Visit | Attending: Physician Assistant | Admitting: Physician Assistant

## 2022-02-13 VITALS — BP 116/76 | HR 61 | Ht 71.0 in | Wt 318.2 lb

## 2022-02-13 DIAGNOSIS — Z7901 Long term (current) use of anticoagulants: Secondary | ICD-10-CM | POA: Insufficient documentation

## 2022-02-13 DIAGNOSIS — I480 Paroxysmal atrial fibrillation: Secondary | ICD-10-CM | POA: Diagnosis not present

## 2022-02-13 DIAGNOSIS — Z79899 Other long term (current) drug therapy: Secondary | ICD-10-CM | POA: Diagnosis not present

## 2022-02-13 DIAGNOSIS — Z8673 Personal history of transient ischemic attack (TIA), and cerebral infarction without residual deficits: Secondary | ICD-10-CM | POA: Insufficient documentation

## 2022-02-13 DIAGNOSIS — I10 Essential (primary) hypertension: Secondary | ICD-10-CM | POA: Diagnosis not present

## 2022-02-13 DIAGNOSIS — E669 Obesity, unspecified: Secondary | ICD-10-CM | POA: Insufficient documentation

## 2022-02-13 DIAGNOSIS — Z713 Dietary counseling and surveillance: Secondary | ICD-10-CM | POA: Diagnosis not present

## 2022-02-13 DIAGNOSIS — G4733 Obstructive sleep apnea (adult) (pediatric): Secondary | ICD-10-CM | POA: Insufficient documentation

## 2022-02-13 DIAGNOSIS — D6869 Other thrombophilia: Secondary | ICD-10-CM | POA: Diagnosis not present

## 2022-02-13 DIAGNOSIS — Z6841 Body Mass Index (BMI) 40.0 and over, adult: Secondary | ICD-10-CM | POA: Diagnosis not present

## 2022-02-13 NOTE — Progress Notes (Signed)
Primary Care Physician: Monico Blitz, MD Primary Cardiologist: Dr Stanford Breed Primary Electrophysiologist: Dr Quentin Ore Referring Physician: Dr Clifton James Albert [name] is a 51 y.o. male with a history of DM, HTN, GERD, OSA, and paroxysmal atrial fibrillation who presents for follow up in the [facility]. The patient was initially diagnosed with atrial fibrillation 12/25/19 after presenting to the ED with symptoms of palpitations, SOB, and some chest heaviness. He states he had brief, intermittent palpitations for two weeks prior. Patient was started on Eliquis for a CHADS2VASC score of 2. He converted back to SR spontaneously in the ED. Since then, he has had some mild palpitations almost daily. He did have another extended episode of heart racing on 10/8 which resolved when he took his BB. He is compliant with his CPAP and denies any alcohol use.   Patient was hospitalized 11/28/20 for a suspected CVA. He lost consciousness while watching TV for 10 seconds and when he woke he  had right sided numbness and limb heaviness. Imaging showed no new infarct but it did show evidence of a prior CVA. Etiology was unclear with question for TIA. Patient was also seen at the ED 12/12/20 for afib lasting 2-3 hours. He converted to SR prior to arrival.  On follow up today, patient reports that he has had a few episodes of afib noted on his smart watch since his previous visit. He has noted that stress and caffeine appear to be common triggers for him. The episodes can last from 2 hours to all night. He has not had any episodes in the past 6 weeks.  Today, he denies symptoms of chest pain, shortness of breath, orthopnea, PND, lower extremity edema, dizziness, presyncope, syncope, bleeding, or neurologic sequela. The patient is tolerating medications without difficulties and is otherwise without complaint today.    Atrial Fibrillation Risk Factors:  he does have symptoms or diagnosis  of sleep apnea. he is compliant with CPAP therapy. he does not have a history of rheumatic fever. he does not have a history of alcohol use. The patient does not have a history of early familial atrial fibrillation or other arrhythmias.  he has a BMI of Body mass index is 44.38 kg/m.[name] Filed Weights   02/13/22 0820  Weight: (!) 144.3 kg    Family History  Problem Relation Age of Onset   Colon cancer Father      Atrial Fibrillation Management history:  Previous antiarrhythmic drugs: Multaq Previous cardioversions: none Previous ablations: none CHADS2VASC score: 4 Anticoagulation history: Eliquis   Past Medical History:  Diagnosis Date   Atrial fibrillation ([place])    CVA (cerebral vascular accident) ([place]) 2022   Diabetes mellitus without complication ([place])    GERD (gastroesophageal reflux disease)    Gout    Hypertension    Low testosterone    Polycythemia, 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   Sleep apnea    Past Surgical History:  Procedure Laterality Date   CARDIAC CATHETERIZATION     COLONOSCOPY     COLONOSCOPY N/A 05/11/2015   Procedure: COLONOSCOPY;  Surgeon: Rogene Houston, MD;  [facility];  Service: Endoscopy;  Laterality: N/A;  930 - moved to 2/15 - Ann to notify   COLONOSCOPY WITH PROPOFOL N/A 09/07/2020   Procedure: COLONOSCOPY WITH PROPOFOL;  Surgeon: Rogene Houston, MD;  [facility];  Service: Endoscopy;  Laterality: N/A;  am   LAPAROSCOPIC APPENDECTOMY N/A 12/22/2021   Procedure: APPENDECTOMY LAPAROSCOPIC;  Surgeon: Arnoldo Morale,  Elta Guadeloupe, MD;  Location: [facility];  Service: General;  Laterality: N/A;   POLYPECTOMY  09/07/2020   Procedure: POLYPECTOMY;  Surgeon: Rogene Houston, MD;  [facility];  Service: Endoscopy;;    Current Outpatient Medications  Medication Sig Dispense Refill   acetaminophen (TYLENOL) 650 MG CR tablet Take 650 mg by mouth every 8 (eight) hours as needed for pain.     clobetasol cream (TEMOVATE) 6.22 % Apply 1  application topically 2 (two) times daily as needed (itching).     diltiazem (CARDIZEM) 30 MG tablet TAKE 1 TABLET EVERY 4 HOURS AS NEEDED FOR AFIB HEART RATE GREATER THAN 100 30 tablet 0   ELIQUIS 5 MG TABS tablet TAKE 1 TABLET TWICE A DAY 180 tablet 2   febuxostat (ULORIC) 40 MG tablet Take 40 mg by mouth daily.     fluticasone (FLONASE) 50 MCG/ACT nasal spray Place 1 spray into both nostrils daily as needed for allergies or rhinitis.     Magnesium 200 MG TABS Take 800 mg by mouth daily. Taking 4 tablets by mouth daily- '800mg'$  total     meloxicam (MOBIC) 15 MG tablet Take 15 mg by mouth as needed for pain.     metoprolol tartrate (LOPRESSOR) 25 MG tablet TAKE 1 TABLET TWICE A DAY 180 tablet 2   MULTAQ 400 MG tablet TAKE 1 TABLET BY MOUTH 2 TIMES A DAY WITH MEALS. 60 tablet 6   pantoprazole (PROTONIX) 40 MG tablet Take 1 tablet (40 mg total) by mouth daily. 30 tablet 0   Potassium 99 MG TABS Take 99 mg by mouth every evening.     tadalafil (CIALIS) 20 MG tablet Take 20 mg by mouth daily as needed for erectile dysfunction.     testosterone cypionate (DEPOTESTOSTERONE CYPIONATE) 200 MG/ML injection Inject 100 mg into the muscle every 14 (fourteen) days. Every 10 Days     vitamin B-12 (CYANOCOBALAMIN) 100 MCG tablet Take 100 mcg by mouth daily.     No current facility-administered medications for this encounter.    Allergies  Allergen Reactions   Penicillins Other (See Comments)    Unknown childhood   Levaquin [Levofloxacin] Palpitations    Social History   Socioeconomic History   Marital status: Married    Spouse name: Nevin Bloodgood   Number of children: Not on file   Years of education: Not on file   Highest education level: Master's degree (e.g., MA, MS, MEng, MEd, MSW, MBA)  Occupational History    Comment: accounting  Tobacco Use   Smoking status: Never   Smokeless tobacco: Never   Tobacco comments:    Never smoke 02/13/22  Vaping Use   Vaping Use: Never used  Substance and Sexual  Activity   Alcohol use: Not Currently    Comment: occasionally   Drug use: No    Comment: reports use of CBG gummies   Sexual activity: Not on file  Other Topics Concern   Not on file  Social History Narrative   Lives with wife   Social Determinants of Health   Financial Resource Strain: Not on file  Food Insecurity: No Food Insecurity (12/21/2021)   Hunger Vital Sign    Worried About Running Out of Food in the Last Year: Never true    [place] in the Last Year: Never true  Transportation Needs: No Transportation Needs (12/21/2021)   PRAPARE - Hydrologist (Medical): No    Lack of Transportation (Non-Medical): No  Physical Activity: Not on file  Stress: Not on file  Social Connections: Not on file  Intimate Partner Violence: Not At Risk (12/21/2021)   Humiliation, Afraid, Rape, and Kick questionnaire    Fear of Current or Ex-Partner: No    Emotionally Abused: No    Physically Abused: No    Sexually Abused: No     ROS- All systems are reviewed and negative except as per the HPI above.  Physical Exam: Vitals:   02/13/22 0820  BP: 116/76  Pulse: 61  Weight: (!) 144.3 kg  Height: '5\' 11"'$  (1.803 m)     GEN- The patient is a well appearing obese male, alert and oriented x 3 today.   HEENT-head normocephalic, atraumatic, sclera clear, conjunctiva pink, hearing intact, trachea midline. Lungs- Clear to ausculation bilaterally, normal work of breathing Heart- Regular rate and rhythm, no murmurs, rubs or gallops  GI- soft, NT, ND, + BS Extremities- no clubbing, cyanosis, or edema MS- no significant deformity or atrophy Skin- no rash or lesion Psych- euthymic mood, full affect Neuro- strength and sensation are intact   Wt Readings from Last 3 Encounters:  02/13/22 (!) 144.3 kg  01/29/22 (!) 143.8 kg  12/21/21 (!) 143.6 kg    EKG today demonstrates  SR Vent. rate 61 BPM PR interval 198 ms QRS duration 102 ms QT/QTcB 398/400  ms  Echo 11/29/20 demonstrated  1. Left ventricular ejection fraction, by estimation, is 55 to 60%. The  left ventricle has normal function. The left ventricle has no regional  wall motion abnormalities. Left ventricular diastolic parameters were  normal.   2. Right ventricular systolic function was not well visualized. The right ventricular size is not well visualized.   3. The mitral valve is grossly normal. Trivial mitral valve  regurgitation.   4. The aortic valve is grossly normal. Aortic valve regurgitation is not visualized. No aortic stenosis is present.    Epic records are reviewed at length today  CHA2DS2-VASc Score = 4  The patient's score is based upon: CHF History: 0 HTN History: 1 Diabetes History: 1 Stroke History: 2 Vascular Disease History: 0 Age Score: 0 Gender Score: 0        ASSESSMENT AND PLAN: 1. Paroxysmal Atrial Fibrillation (ICD10:  I48.0) The patient's CHA2DS2-VASc score is 4, indicating a 4.8% annual risk of stroke.   Patient in [place] today but has had some interim episodes. We discussed alternate AADs (flecainide, dofetilide). He would like to continue Multaq for now and focus on lifestyle modification/weight loss.  Continue Eliquis 5 mg BID Continue Lopressor 25 mg BID Continue Multaq 400 mg BID Continue diltiazem 30 mg PRN q 4 hours for heat racing.   2. Secondary Hypercoagulable State (ICD10:  D68.69) The patient is at significant risk for stroke/thromboembolism based upon his CHA2DS2-VASc Score of 4.  Continue Apixaban (Eliquis).   3. Obesity Body mass index is 44.38 kg/m. Lifestyle modification was discussed and encouraged including regular physical activity and weight reduction. We again discussed referral [facility], patient deferred.  4. Obstructive sleep apnea Encouraged compliance with CPAP therapy. Patient has upcoming sleep consult with Dr Rexene Alberts.  5. HTN Stable, no changes today.   Follow up in the [facility]  clinic in 6 months.    [facility] [address] [place], [place] [address] [phone] 02/13/2022 8:30 AM

## 2022-02-20 ENCOUNTER — Ambulatory Visit (INDEPENDENT_AMBULATORY_CARE_PROVIDER_SITE_OTHER): Payer: BC Managed Care – PPO | Admitting: Neurology

## 2022-02-20 ENCOUNTER — Encounter: Payer: Self-pay | Admitting: Neurology

## 2022-02-20 VITALS — BP 134/82 | HR 58 | Ht 71.0 in | Wt 320.8 lb

## 2022-02-20 DIAGNOSIS — D751 Secondary polycythemia: Secondary | ICD-10-CM | POA: Diagnosis not present

## 2022-02-20 DIAGNOSIS — R634 Abnormal weight loss: Secondary | ICD-10-CM

## 2022-02-20 DIAGNOSIS — G4733 Obstructive sleep apnea (adult) (pediatric): Secondary | ICD-10-CM

## 2022-02-20 DIAGNOSIS — R7989 Other specified abnormal findings of blood chemistry: Secondary | ICD-10-CM

## 2022-02-20 NOTE — Progress Notes (Signed)
Subjective:    Patient ID: Albert [name] is a 51 y.o. male.  HPI    Star Age, MD, PhD [facility] [address] [place], [place] [address]  Dear Dr. Manuella Ghazi,   I saw your patient, Albert [name], upon your kind request in my sleep clinic today for initial consultation of his sleep disorder, in particular, evaluation of his prior diagnosis of obstructive sleep apnea.  The patient is unaccompanied today. As you know, Albert [name] is a 51 year old male with an underlying medical history of stroke (followed at [facility] by Frann Rider, NP and Dr. Leonie Man), paroxysmal atrial fibrillation, secondary polycythemia, diabetes, reflux disease, chronic kidney disease, allergic rhinitis, hypertension, gout, low testosterone, and severe obesity with a BMI of over 76, who was previously diagnosed with obstructive sleep apnea and placed on PAP therapy.  He was diagnosed with secondary polycythemia.  Subsequently his testosterone dose was reduced but he felt better on the higher dose.  He has had 3-4 sessions of phlebotomy for therapy, has seen hematology.   I reviewed your office note from 01/02/2022.  Prior sleep study results are not available for my review today. As per his recollection, his sleep study was about 15 years ago. I was able to review his PAP compliance data from his current autoPAP machine, which he brought in for the appointment.  I reviewed his compliance data from 11/23/2021 through 02/20/2022, he used his machine every night with percent use days greater than 4 hours at nearly 100%, indicating superb compliance, average usage of 9 hours and 24 minutes, residual AHI at goal at 0.6/h, leak on the high side fairly consistently with the 95th percentile leak 54.3 L/min on a pressure of 14 to 20 cm, 95th percentile of pressure at 14.2 cm.  He uses nasal pillows.  He has tried a nasal mask which did not work out in the past.  He does not use a chinstrap.  He  has a ResMed air sense 10 AutoSet machine.  His DME company is [employer].  He received this machine about 2 years ago.  His prior sleep specialist was Dr. Brandon Melnick.  His Epworth sleepiness score is 6 out of 24, fatigue severity score is 23 out of 63.  He has benefited from PAP therapy over the years, particularly his daytime somnolence improved.  He has been working on weight loss and has lost about 100 pounds since 2019, continues to work on weight loss at this time.  Bedtime is generally around 9 PM and rise time around 5 AM.  He lives with his family including wife and 2 children, ages 49 and 18, he also has a 62 year old who is on their own.  He does have a TV in the bedroom but does not have it on at night.  They have 1 dog in the household but the dog does not sleep in their bedroom.  He drinks caffeine in the form of tea and soda, about 3 servings per day, he does not currently drink any alcohol, he is a non-smoker.  He is not aware of any family history of sleep apnea.  He works as a Facilities manager, Optometrist.  His Past Medical History Is Significant For: Past Medical History:  Diagnosis Date   Atrial fibrillation ([place])    CVA (cerebral vascular accident) ([place]) 2022   Diabetes mellitus without complication ([place])    GERD (gastroesophageal reflux disease)    Gout    Hypertension  Low testosterone    Polycythemia, 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   Sleep apnea     His Past Surgical History Is Significant For: Past Surgical History:  Procedure Laterality Date   CARDIAC CATHETERIZATION     COLONOSCOPY     COLONOSCOPY N/A 05/11/2015   Procedure: COLONOSCOPY;  Surgeon: Rogene Houston, MD;  [facility];  Service: Endoscopy;  Laterality: N/A;  930 - moved to 2/15 - Ann to notify   COLONOSCOPY WITH PROPOFOL N/A 09/07/2020   Procedure: COLONOSCOPY WITH PROPOFOL;  Surgeon: Rogene Houston, MD;  [facility];  Service: Endoscopy;  Laterality: N/A;  am   LAPAROSCOPIC  APPENDECTOMY N/A 12/22/2021   Procedure: APPENDECTOMY LAPAROSCOPIC;  Surgeon: Aviva Signs, MD;  Location: [facility];  Service: General;  Laterality: N/A;   POLYPECTOMY  09/07/2020   Procedure: POLYPECTOMY;  Surgeon: Rogene Houston, MD;  [facility];  Service: Endoscopy;;    His Family History Is Significant For: Family History  Problem Relation Age of Onset   Colon cancer Father     His Social History Is Significant For: Social History   Socioeconomic History   Marital status: Married    Spouse name: Nevin Bloodgood   Number of children: Not on file   Years of education: Not on file   Highest education level: Master's degree (e.g., MA, MS, MEng, MEd, MSW, MBA)  Occupational History    Comment: accounting  Tobacco Use   Smoking status: Never   Smokeless tobacco: Never   Tobacco comments:    Never smoke 02/13/22  Vaping Use   Vaping Use: Never used  Substance and Sexual Activity   Alcohol use: Not Currently    Comment: occasionally   Drug use: No    Comment: reports use of CBG gummies   Sexual activity: Not on file  Other Topics Concern   Not on file  Social History Narrative   Lives with wife   Right Handed   2 [place] daily   Social Determinants of Health   Financial Resource Strain: Not on file  Food Insecurity: No Food Insecurity (12/21/2021)   Hunger Vital Sign    Worried About Running Out of Food in the Last Year: Never true    [place] in the Last Year: Never true  Transportation Needs: No Transportation Needs (12/21/2021)   PRAPARE - Hydrologist (Medical): No    Lack of Transportation (Non-Medical): No  Physical Activity: Not on file  Stress: Not on file  Social Connections: Not on file    His Allergies Are:  Allergies  Allergen Reactions   Penicillins Other (See Comments)    Unknown childhood   Levaquin [Levofloxacin] Palpitations  :   His Current Medications Are:  Outpatient Encounter Medications as of  02/20/2022  Medication Sig   acetaminophen (TYLENOL) 650 MG CR tablet Take 650 mg by mouth every 8 (eight) hours as needed for pain.   diltiazem (CARDIZEM) 30 MG tablet TAKE 1 TABLET EVERY 4 HOURS AS NEEDED FOR AFIB HEART RATE GREATER THAN 100   ELIQUIS 5 MG TABS tablet TAKE 1 TABLET TWICE A DAY   febuxostat (ULORIC) 40 MG tablet Take 40 mg by mouth daily.   fluticasone (FLONASE) 50 MCG/ACT nasal spray Place 1 spray into both nostrils daily as needed for allergies or rhinitis.   Magnesium 200 MG TABS Take 800 mg by mouth daily. Taking 4 tablets by mouth daily- '800mg'$  total   meloxicam (  MOBIC) 15 MG tablet Take 15 mg by mouth as needed for pain.   metoprolol tartrate (LOPRESSOR) 25 MG tablet TAKE 1 TABLET TWICE A DAY   MULTAQ 400 MG tablet TAKE 1 TABLET BY MOUTH 2 TIMES A DAY WITH MEALS.   pantoprazole (PROTONIX) 40 MG tablet Take 1 tablet (40 mg total) by mouth daily.   tadalafil (CIALIS) 20 MG tablet Take 20 mg by mouth daily as needed for erectile dysfunction.   testosterone cypionate (DEPOTESTOSTERONE CYPIONATE) 200 MG/ML injection Inject 100 mg into the muscle every 14 (fourteen) days. Every 10 Days   vitamin B-12 (CYANOCOBALAMIN) 100 MCG tablet Take 100 mcg by mouth daily.   clobetasol cream (TEMOVATE) 8.31 % Apply 1 application topically 2 (two) times daily as needed (itching).   Potassium 99 MG TABS Take 99 mg by mouth every evening.   No facility-administered encounter medications on file as of 02/20/2022.  :   Review of Systems:  Out of a complete 14 point review of systems, all are reviewed and negative with the exception of these symptoms as listed below:   Review of Systems  Neurological:        Pt is Here for Sleep Consult. Pt brought CPAP Machine with him. Pt is here for a sleep study. Pt is states that he would like his machine looked at especially after High Hemoglobin. Pt states no headaches. Pt states less Fatigue during the day.   ESS:6 FSS:23      Objective:   Neurological Exam  Physical Exam Physical Examination:   Vitals:   02/20/22 1224  BP: 134/82  Pulse: (!) 58    General Examination: The patient is a very pleasant 51 y.o. male in no acute distress. He appears well-developed and well-nourished and well groomed.   HEENT: Normocephalic, atraumatic, pupils are equal, round and reactive to light, extraocular tracking is good without limitation to gaze excursion or nystagmus noted. Hearing is grossly intact. Face is symmetric with normal facial animation. Speech is clear with no dysarthria noted. There is no hypophonia. There is no lip, neck/head, jaw or voice tremor. Neck is supple with full range of passive and active motion. There are no carotid bruits on auscultation. Oropharynx exam reveals: mild mouth dryness, good dental hygiene and moderate airway crowding, due to small airway entry and redundant soft palate, tonsils on the smaller side, Mallampati clas II.  Neck circumference 20 3/8 inches.  Tongue protrudes centrally and palate elevates symmetrically.  Chest: Clear to auscultation without wheezing, rhonchi or crackles noted.  Heart: S1+S2+0, regular and normal without murmurs, rubs or gallops noted.   Abdomen: Soft, non-tender and non-distended.  Extremities: There is no obvious swelling in the distal lower extremities bilaterally.   Skin: Warm and dry without trophic changes noted.   Musculoskeletal: exam reveals no obvious joint deformities.   Neurologically:  Mental status: The patient is awake, alert and oriented in all 4 spheres. His immediate and remote memory, attention, language skills and fund of knowledge are appropriate. There is no evidence of aphasia, agnosia, apraxia or anomia. Speech is clear with normal prosody and enunciation. Thought process is linear. Mood is normal and affect is normal.  Cranial nerves II - XII are as described above under HEENT exam.  Motor exam: Normal bulk, moves all 4 extremities, no  obvious tremor.   Fine motor skills and coordination: grossly intact.  Cerebellar testing: No dysmetria or intention tremor. There is no truncal or gait ataxia.  Sensory exam: intact to  light touch in the upper and lower extremities.  Gait, station and balance: He stands easily. No veering to one side is noted. No leaning to one side is noted. Posture is age-appropriate and stance is narrow based. Gait shows normal stride length and normal pace. No problems turning are noted.   Assessment and Plan:  In summary, Albert [name] is a very pleasant 51 y.o.-year old male with an underlying medical history of stroke (followed at [facility] by Frann Rider, NP and Dr. Leonie Man), paroxysmal atrial fibrillation, secondary polycythemia, diabetes, reflux disease, chronic kidney disease, allergic rhinitis, hypertension, gout, low testosterone, and severe obesity with a BMI of over 27, who presents for evaluation of his obstructive sleep apnea.  He has been on AutoPap therapy with good results and full compliance.  He is commended for his treatment adherence.  Apnea scores look good.  Nevertheless, I would like to make sure that his oxygen saturations are good as well when he is on AutoPap therapy.  I suggested we keep his settings on the machine the same but proceed with an overnight pulse oximetry test while on AutoPap therapy at home.  We will set this up through his DME provider.  If need be, we can consider a full night titration study or repeat sleep test to optimize treatment settings but from the looks of it, according to the download data he has excellent apnea control with an average pressure of around 14 cm.  He has had significant weight loss since his original sleep apnea diagnosis.  He is encouraged to continue to work on weight loss.  If all goes well, we can see him back in a year, if need be we can consider a titration study in the interim.  I answered all his questions today and he was in agreement with our  plan.   Thank you very much for allowing me to participate in the care of this nice patient. If I can be of any further assistance to you please do not hesitate to call me at [phone].  Sincerely,   Star Age, MD, PhD

## 2022-02-20 NOTE — Patient Instructions (Signed)
Thank you for choosing [facility] for your sleep related care! It was nice to meet you today!   Here is what we discussed today:    Based on your autoPAP download, your sleep apnea events are low.  Your leak from the mask is high. I suggest you consider a full face mask or a chinstrap with your current mask; I have prescribed a chinstrap for now.   As discussed, we will do an overnight oxygen level test, called ONO, and your DME company will call and set this up for one night, while you also use your autoPAP as usual. We will call you with the results. This is to make sure that your oxygen levels stay in the 90s, while you are treated with autoPAP for your OSA.   Please remember, the long-term risks and ramifications of untreated moderate to severe obstructive sleep apnea may include (but are not limited to): increased risk for cardiovascular disease, including congestive heart failure, stroke, difficult to control hypertension, treatment resistant obesity, arrhythmias, especially irregular heartbeat commonly known as A. Fib. (atrial fibrillation); even type 2 diabetes has been linked to untreated OSA.   Other correlations that untreated obstructive sleep apnea include macular edema which is swelling of the retina in the eyes, droopy eyelid syndrome, and elevated hemoglobin and hematocrit levels (often referred to as polycythemia).  Sleep apnea can cause disruption of sleep and sleep deprivation in most cases, which, in turn, can cause recurrent headaches, problems with memory, mood, concentration, focus, and vigilance. Most people with untreated sleep apnea report excessive daytime sleepiness, which can affect their ability to drive. Please do not drive or use heavy equipment or machinery, if you feel sleepy! Patients with sleep apnea can also develop difficulty initiating and maintaining sleep (aka insomnia).   Having sleep apnea may increase your risk for other sleep disorders,  including involuntary behaviors sleep such as sleep terrors, sleep talking, sleepwalking.    Having sleep apnea can also increase your risk for restless leg syndrome and leg movements at night.   Please note that untreated obstructive sleep apnea may carry additional perioperative morbidity. Patients with significant obstructive sleep apnea (typically, in the moderate to severe degree) should receive, if possible, perioperative PAP (positive airway pressure) therapy and the surgeons and particularly the anesthesiologists should be informed of the diagnosis and the severity of the sleep disordered breathing.   We will call you or email you through [facility] with regards to your test results and plan a follow-up in sleep clinic accordingly.  Most likely, you will hear from one of our nurses.   You are very compliant with your autoPAP. Keep up the good work with your weight loss.   Please continue using your autoPAP regularly. While your insurance requires that you use PAP at least 4 hours each night on 70% of the nights, I recommend, that you not skip any nights and use it throughout the night if you can. Getting used to PAP and staying with the treatment long term does take time and patience and discipline. Untreated obstructive sleep apnea when it is moderate to severe can have an adverse impact on cardiovascular health and raise her risk for heart disease, arrhythmias, hypertension, congestive heart failure, stroke and diabetes. Untreated obstructive sleep apnea causes sleep disruption, nonrestorative sleep, and sleep deprivation. This can have an impact on your day to day functioning and cause daytime sleepiness and impairment of cognitive function, memory loss, mood disturbance, and problems focussing. Using PAP regularly  can improve these symptoms.

## 2022-02-22 NOTE — Progress Notes (Signed)
Fax confirmation received to [facility] for ONO on autopap and pap supplies.  [phone].

## 2022-02-28 ENCOUNTER — Encounter: Payer: Self-pay | Admitting: Neurology

## 2022-02-28 NOTE — Telephone Encounter (Signed)
Mateo Flow, RN; [place], Leory Plowman; [place], [place]; Arville Care, Lenna Sciara received!     Previous Messages    ----- Message ----- From: Brandon Melnick, RN Sent: 02/28/2022  10:10 AM EST To: Darlina Guys; Miquel Dunn; Nash Shearer; * Subject: pulso oximetry ONO 24 hour on PAP              This pt has used [facility] for pap, but they do not do ONO,  Are you able to do this for pt?    Glendell Docker. Gannon Heinzman" Male, 51 y.o., 1970-04-28 MRN: [id number] Phone: [phone]   Let me know if problem,  I called [facility], [place] [facility].  They will not do.    Acupuncturist

## 2022-03-05 ENCOUNTER — Other Ambulatory Visit (HOSPITAL_COMMUNITY): Payer: Self-pay | Admitting: Physician Assistant

## 2022-03-13 ENCOUNTER — Ambulatory Visit: Payer: BC Managed Care – PPO | Admitting: Cardiology

## 2022-03-15 ENCOUNTER — Encounter: Payer: Self-pay | Admitting: Neurology

## 2022-03-15 NOTE — Telephone Encounter (Signed)
I received patient's pulse oximetry test results.  He had an overnight pulse oximetry test while on CPAP therapy through [facility] on 03/09/2022 through 03/10/2022 for a total duration of 8 hours and 10 minutes.  Average oxygen saturation was 94%, nadir was 88%, time below or at 88% saturation was 2 seconds.  Patient can maintain current CPAP therapy and does not qualify or need any additional/supplemental oxygen

## 2022-03-15 NOTE — Telephone Encounter (Signed)
Received results of overnight oximetry on cpap.  To review placed in Dr. Guadelupe Sabin inbox.

## 2022-04-01 ENCOUNTER — Other Ambulatory Visit (HOSPITAL_COMMUNITY): Payer: Self-pay | Admitting: Physician Assistant

## 2022-04-10 DIAGNOSIS — Z125 Encounter for screening for malignant neoplasm of prostate: Secondary | ICD-10-CM | POA: Diagnosis not present

## 2022-04-10 DIAGNOSIS — E291 Testicular hypofunction: Secondary | ICD-10-CM | POA: Diagnosis not present

## 2022-04-17 DIAGNOSIS — E291 Testicular hypofunction: Secondary | ICD-10-CM | POA: Diagnosis not present

## 2022-04-22 NOTE — Progress Notes (Unsigned)
Electrophysiology Office Follow up Visit Note:    Date:  04/22/2022   ID:  Albert [name], DOB 04/30/1970, MRN [id number]  PCP:  Monico Blitz, MD  [facility] Cardiologist:  Kirk Ruths, MD  [facility] Electrophysiologist:  Vickie Epley, MD    Interval History:    Albert [name] is a 52 y.o. male who presents for a follow up visit. They were last seen in clinic by me 01/27/2021 for paroxysmal AF. At that appointment we discussed AF ablation but he needed to lose weight to improve the chances of a successful procedure. He called in prior to the procedure to cancel because he had not lost weight.  He met with [facility] 02/13/2022. He reported several episodes of AF. He takes multaq, lopressor, eliquis and dilt prn.        Past Medical History:  Diagnosis Date   Atrial fibrillation ([place])    CVA (cerebral vascular accident) ([place]) 2022   Diabetes mellitus without complication ([place])    GERD (gastroesophageal reflux disease)    Gout    Hypertension    Low testosterone    Polycythemia, 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   Sleep apnea     Past Surgical History:  Procedure Laterality Date   CARDIAC CATHETERIZATION     COLONOSCOPY     COLONOSCOPY N/A 05/11/2015   Procedure: COLONOSCOPY;  Surgeon: Rogene Houston, MD;  [facility];  Service: Endoscopy;  Laterality: N/A;  930 - moved to 2/15 - Ann to notify   COLONOSCOPY WITH PROPOFOL N/A 09/07/2020   Procedure: COLONOSCOPY WITH PROPOFOL;  Surgeon: Rogene Houston, MD;  Location: [facility];  Service: Endoscopy;  Laterality: N/A;  am   LAPAROSCOPIC APPENDECTOMY N/A 12/22/2021   Procedure: APPENDECTOMY LAPAROSCOPIC;  Surgeon: Aviva Signs, MD;  Location: [facility] ORS;  Service: General;  Laterality: N/A;   POLYPECTOMY  09/07/2020   Procedure: POLYPECTOMY;  Surgeon: Rogene Houston, MD;  Location: [facility];  Service: Endoscopy;;    Current Medications: No outpatient medications have been marked as taking for the  04/23/22 encounter (Appointment) with Vickie Epley, MD.     Allergies:   Penicillins and Levaquin [levofloxacin]   Social History   Socioeconomic History   Marital status: Married    Spouse name: Nevin Bloodgood   Number of children: Not on file   Years of education: Not on file   Highest education level: Master's degree (e.g., MA, MS, MEng, MEd, MSW, MBA)  Occupational History    Comment: accounting  Tobacco Use   Smoking status: Never   Smokeless tobacco: Never   Tobacco comments:    Never smoke 02/13/22  Vaping Use   Vaping Use: Never used  Substance and Sexual Activity   Alcohol use: Not Currently    Comment: occasionally   Drug use: No    Comment: reports use of CBG gummies   Sexual activity: Not on file  Other Topics Concern   Not on file  Social History Narrative   Lives with wife   Right Handed   2 Sodas daily   Social Determinants of Health   Financial Resource Strain: Not on file  Food Insecurity: No Food Insecurity (12/21/2021)   Hunger Vital Sign    Worried About Running Out of Food in the Last Year: Never true    [place] in the Last Year: Never true  Transportation Needs: No Transportation Needs (12/21/2021)   PRAPARE - Transportation    Lack of Transportation (  Medical): No    Lack of Transportation (Non-Medical): No  Physical Activity: Not on file  Stress: Not on file  Social Connections: Not on file     Family History: The patient's family history includes Colon cancer in his father.  ROS:   Please see the history of present illness.    All other systems reviewed and are negative.  EKGs/Labs/Other Studies Reviewed:    The following studies were reviewed today:    Recent Labs: 12/23/2021: BUN 21; Creatinine, Ser 1.40; Potassium 4.3; Sodium 137 01/29/2022: Hemoglobin 16.8; Platelets 197  Recent Lipid Panel    Component Value Date/Time   CHOL 148 11/29/2020 0124   TRIG 63 11/29/2020 0124   HDL 35 (L) 11/29/2020 0124   CHOLHDL 4.2  11/29/2020 0124   VLDL 13 11/29/2020 0124   LDLCALC 100 (H) 11/29/2020 0124    Physical Exam:    VS:  There were no vitals taken for this visit.    Wt Readings from Last 3 Encounters:  02/20/22 (!) 320 lb 12.8 oz (145.5 kg)  02/13/22 (!) 318 lb 3.2 oz (144.3 kg)  01/29/22 (!) 317 lb (143.8 kg)     GEN: *** Well nourished, well developed in no acute distress CARDIAC: ***RRR, no murmurs, rubs, gallops RESPIRATORY:  Clear to auscultation without rales, wheezing or rhonchi     ASSESSMENT:    No diagnosis found. PLAN:    In order of problems listed above:   #pAF  #Hypertension *** goal today.  Recommend checking blood pressures 1-2 times per week at home and recording the values.  Recommend bringing these recordings to the primary care physician.  #Obesity Discussed link b/w obesity and AF during today's appointment.  Follow up 6 months with AF clinic.    Medication Adjustments/Labs and Tests Ordered: Current medicines are reviewed at length with the patient today.  Concerns regarding medicines are outlined above.  No orders of the defined types were placed in this encounter.  No orders of the defined types were placed in this encounter.    Signed, Lars Mage, MD, [facility], [facility] 04/22/2022 8:01 PM    Electrophysiology [facility]

## 2022-04-23 ENCOUNTER — Encounter: Payer: Self-pay | Admitting: Cardiology

## 2022-04-23 ENCOUNTER — Ambulatory Visit: Payer: BC Managed Care – PPO | Attending: Cardiology | Admitting: Cardiology

## 2022-04-23 VITALS — BP 140/76 | HR 66 | Ht 71.0 in | Wt 321.0 lb

## 2022-04-23 DIAGNOSIS — I480 Paroxysmal atrial fibrillation: Secondary | ICD-10-CM | POA: Diagnosis not present

## 2022-04-23 DIAGNOSIS — I10 Essential (primary) hypertension: Secondary | ICD-10-CM | POA: Diagnosis not present

## 2022-04-23 NOTE — Patient Instructions (Signed)
Medication Instructions:  Your physician recommends that you continue on your current medications as directed. Please refer to the Current Medication list given to you today.  *If you need a refill on your cardiac medications before your next appointment, please call your pharmacy*  Follow-Up: At [facility], you and your health needs are our priority.  As part of our continuing mission to provide you with exceptional heart care, we have created designated Provider Care Teams.  These Care Teams include your primary Cardiologist (physician) and Advanced Practice Providers (APPs -  Physician Assistants and Nurse Practitioners) who all work together to provide you with the care you need, when you need it.  Your next appointment:   6 month(s)  Provider:   You will see one of the following Advanced Practice Providers on your designated Care Team:   Renee Ursuy, PA-C Michael "Andy" Tillery, PA-C Suzann Riddle, NP    

## 2022-04-23 NOTE — Progress Notes (Signed)
Electrophysiology Office Follow up Visit Note:    Date:  04/23/2022   ID:  Albert [name], DOB 05-29-1970, MRN [id number]  PCP:  Albert Blitz, MD  [facility] Cardiologist:  Albert Ruths, MD  [facility] Electrophysiologist:  Albert Epley, MD    Interval History:    Albert [name] is a 52 y.o. male who presents for a follow up visit. They were last seen in clinic by me 01/27/2021 for paroxysmal AF. At that appointment we discussed AF ablation but he needed to lose weight to improve the chances of a successful procedure. He called in prior to the procedure to cancel because he had not lost weight.  He met with [facility] 02/13/2022. He reported several episodes of AF. He takes multaq, lopressor, eliquis and dilt prn.   Today, he reports feeling well overall. His AF episodes are occasional. He notes that his triggers include lack of hydration, stress, and not taking magnesium/sodium/potassium supplements. He also links episodes to meal/snack times and aroun 5 pm.  He compliantly takes his Multaq 400 and Eliquis 5 MG. He follows a low carb diet and walks 3-4 miles a day. He does not regularly check his BP at home, but reports a reading of 129/80 this morning.  He denies any chest pain, shortness of breath, or peripheral edema. No lightheadedness, headaches, syncope, orthopnea, or PND.       Past Medical History:  Diagnosis Date   Atrial fibrillation ([place])    CVA (cerebral vascular accident) ([place]) 2022   Diabetes mellitus without complication ([place])    GERD (gastroesophageal reflux disease)    Gout    Hypertension    Low testosterone    Polycythemia, 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   Sleep apnea     Past Surgical History:  Procedure Laterality Date   CARDIAC CATHETERIZATION     COLONOSCOPY     COLONOSCOPY N/A 05/11/2015   Procedure: COLONOSCOPY;  Surgeon: Albert Houston, MD;  [facility];  Service: Endoscopy;  Laterality: N/A;  930 - moved to 2/15 - Ann to  notify   COLONOSCOPY WITH PROPOFOL N/A 09/07/2020   Procedure: COLONOSCOPY WITH PROPOFOL;  Surgeon: Albert Houston, MD;  [facility];  Service: Endoscopy;  Laterality: N/A;  am   LAPAROSCOPIC APPENDECTOMY N/A 12/22/2021   Procedure: APPENDECTOMY LAPAROSCOPIC;  Surgeon: Albert Signs, MD;  Location: [facility] ORS;  Service: General;  Laterality: N/A;   POLYPECTOMY  09/07/2020   Procedure: POLYPECTOMY;  Surgeon: Albert Houston, MD;  Location: [facility];  Service: Endoscopy;;    Current Medications: Current Meds  Medication Sig   acetaminophen (TYLENOL) 650 MG CR tablet Take 650 mg by mouth every 8 (eight) hours as needed for pain.   colchicine 0.6 MG tablet Take 0.6 mg by mouth daily. As needed for gout   diltiazem (CARDIZEM) 30 MG tablet TAKE 1 TABLET EVERY 4 HOURS AS NEEDED FOR AFIB HEART RATE GREATER THAN 100   dronedarone (MULTAQ) 400 MG tablet TAKE 1 TABLET BY MOUTH 2 TIMES A DAY WITH A MEAL.   ELIQUIS 5 MG TABS tablet TAKE 1 TABLET TWICE A DAY   febuxostat (ULORIC) 40 MG tablet Take 40 mg by mouth daily.   fluticasone (FLONASE) 50 MCG/ACT nasal spray Place 1 spray into both nostrils daily as needed for allergies or rhinitis.   Magnesium 200 MG TABS Take 800 mg by mouth daily. Taking 4 tablets by mouth daily- '800mg'$  total   meloxicam (MOBIC) 15 MG tablet  Take 15 mg by mouth as needed for pain.   metoprolol tartrate (LOPRESSOR) 25 MG tablet TAKE 1 TABLET TWICE A DAY   pantoprazole (PROTONIX) 40 MG tablet Take 1 tablet (40 mg total) by mouth daily.   predniSONE (DELTASONE) 5 MG tablet Take 5 mg by mouth as needed (As needed for gout flare ups).   tadalafil (CIALIS) 20 MG tablet Take 20 mg by mouth daily as needed for erectile dysfunction.   testosterone cypionate (DEPOTESTOSTERONE CYPIONATE) 200 MG/ML injection Inject 100 mg into the muscle every 14 (fourteen) days. Every 10 Days     Allergies:   Penicillins and Levaquin [levofloxacin]   Social History   Socioeconomic  History   Marital status: Married    Spouse name: Albert [name]   Number of children: Not on file   Years of education: Not on file   Highest education level: Master's degree (e.g., MA, MS, MEng, MEd, MSW, MBA)  Occupational History    Comment: accounting  Tobacco Use   Smoking status: Never   Smokeless tobacco: Never   Tobacco comments:    Never smoke 02/13/22  Vaping Use   Vaping Use: Never used  Substance and Sexual Activity   Alcohol use: Not Currently    Comment: occasionally   Drug use: No    Comment: reports use of CBG gummies   Sexual activity: Not on file  Other Topics Concern   Not on file  Social History Narrative   Lives with wife   Right Handed   2 Sodas daily   Social Determinants of Health   Financial Resource Strain: Not on file  Food Insecurity: No Food Insecurity (12/21/2021)   Hunger Vital Sign    Worried About Running Out of Food in the Last Year: Never true    [place] in the Last Year: Never true  Transportation Needs: No Transportation Needs (12/21/2021)   PRAPARE - Hydrologist (Medical): No    Lack of Transportation (Non-Medical): No  Physical Activity: Not on file  Stress: Not on file  Social Connections: Not on file     Family History: The patient's family history includes Colon cancer in his father.  ROS:   Please see the history of present illness.     All other systems reviewed and are negative.  EKGs/Labs/Other Studies Reviewed:    The following studies were reviewed today:  EKG: EKG is personally reviewed. 04/23/22: Sinus rhythm.  Ventricular rate 66 bpm.  QTc is 429 ms.  Recent Labs: 12/23/2021: BUN 21; Creatinine, Ser 1.40; Potassium 4.3; Sodium 137 01/29/2022: Hemoglobin 16.8; Platelets 197  Recent Lipid Panel    Component Value Date/Time   CHOL 148 11/29/2020 0124   TRIG 63 11/29/2020 0124   HDL 35 (L) 11/29/2020 0124   CHOLHDL 4.2 11/29/2020 0124   VLDL 13 11/29/2020 0124   LDLCALC 100 (H)  11/29/2020 0124    Physical Exam:    VS:  BP (!) 140/76   Pulse 66   Ht '5\' 11"'$  (1.803 m)   Wt (!) 321 lb (145.6 kg)   SpO2 99%   BMI 44.77 kg/m     Wt Readings from Last 3 Encounters:  04/23/22 (!) 321 lb (145.6 kg)  02/20/22 (!) 320 lb 12.8 oz (145.5 kg)  02/13/22 (!) 318 lb 3.2 oz (144.3 kg)     GEN:  Well nourished, well developed in no acute distress CARDIAC: RRR, no murmurs, rubs, gallops RESPIRATORY:  Clear to  auscultation without rales, wheezing or rhonchi     ASSESSMENT:    1. Paroxysmal atrial fibrillation (HCC)   2. Essential hypertension   3. Morbid obesity ([place])    PLAN:    In order of problems listed above:   #pAF Overall low burden.  The patient is teased out some triggers for his atrial fibrillation episodes and he is trying to avoid/minimize these.  I have encouraged him to stay active and hydrated.  He will continue on Multaq at the current dosage.  He will continue Eliquis for stroke prophylaxis.  CHA2DS2-VASc Score = 4  The patient's score is based upon: CHF History: 0 HTN History: 1 Diabetes History: 1 Stroke History: 2 Vascular Disease History: 0 Age Score: 0 Gender Score: 0   #Hypertension Slightly above goal today.  His blood pressure at home this morning was 129/80.  Recommend checking blood pressures 1-2 times per week at home and recording the values.  Recommend bringing these recordings to the primary care physician.  #Obesity Discussed link b/w obesity and AF during today's appointment.   Follow-up in 6 months with APP.    Medication Adjustments/Labs and Tests Ordered: Current medicines are reviewed at length with the patient today.  Concerns regarding medicines are outlined above.  Orders Placed This Encounter  Procedures   EKG 12-Lead   No orders of the defined types were placed in this encounter.   I,Mitra Faeizi,acting as a Education administrator for Albert Epley, MD.,have documented all relevant documentation on the behalf of  Albert Epley, MD,as directed by  Albert Epley, MD while in the presence of Albert Epley, MD.  I, Albert Epley, MD, have reviewed all documentation for this visit. The documentation on 04/23/22 for the exam, diagnosis, procedures, and orders are all accurate and complete.   Signed, Lars Mage, MD, [facility], [facility] 04/23/2022 12:21 PM    Electrophysiology  [facility]

## 2022-05-10 DIAGNOSIS — J069 Acute upper respiratory infection, unspecified: Secondary | ICD-10-CM | POA: Diagnosis not present

## 2022-05-10 DIAGNOSIS — R059 Cough, unspecified: Secondary | ICD-10-CM | POA: Diagnosis not present

## 2022-05-10 DIAGNOSIS — E1165 Type 2 diabetes mellitus with hyperglycemia: Secondary | ICD-10-CM | POA: Diagnosis not present

## 2022-05-10 DIAGNOSIS — Z299 Encounter for prophylactic measures, unspecified: Secondary | ICD-10-CM | POA: Diagnosis not present

## 2022-05-10 DIAGNOSIS — E1122 Type 2 diabetes mellitus with diabetic chronic kidney disease: Secondary | ICD-10-CM | POA: Diagnosis not present

## 2022-05-14 DIAGNOSIS — R5383 Other fatigue: Secondary | ICD-10-CM | POA: Diagnosis not present

## 2022-05-14 DIAGNOSIS — Z299 Encounter for prophylactic measures, unspecified: Secondary | ICD-10-CM | POA: Diagnosis not present

## 2022-05-14 DIAGNOSIS — U071 COVID-19: Secondary | ICD-10-CM | POA: Diagnosis not present

## 2022-05-28 DIAGNOSIS — G4733 Obstructive sleep apnea (adult) (pediatric): Secondary | ICD-10-CM | POA: Diagnosis not present

## 2022-05-28 DIAGNOSIS — Z299 Encounter for prophylactic measures, unspecified: Secondary | ICD-10-CM | POA: Diagnosis not present

## 2022-05-28 DIAGNOSIS — E1165 Type 2 diabetes mellitus with hyperglycemia: Secondary | ICD-10-CM | POA: Diagnosis not present

## 2022-05-28 DIAGNOSIS — E1122 Type 2 diabetes mellitus with diabetic chronic kidney disease: Secondary | ICD-10-CM | POA: Diagnosis not present

## 2022-05-28 DIAGNOSIS — I10 Essential (primary) hypertension: Secondary | ICD-10-CM | POA: Diagnosis not present

## 2022-05-28 DIAGNOSIS — M109 Gout, unspecified: Secondary | ICD-10-CM | POA: Diagnosis not present

## 2022-06-01 ENCOUNTER — Other Ambulatory Visit (HOSPITAL_COMMUNITY): Payer: Self-pay | Admitting: *Deleted

## 2022-06-01 MED ORDER — APIXABAN 5 MG PO TABS: 5.0000 mg | ORAL_TABLET | Freq: Two times a day (BID) | ORAL | 2 refills | Status: DC

## 2022-06-01 MED ORDER — METOPROLOL TARTRATE 25 MG PO TABS: 25.0000 mg | ORAL_TABLET | Freq: Two times a day (BID) | ORAL | 2 refills | Status: DC

## 2022-06-08 ENCOUNTER — Encounter: Payer: Self-pay | Admitting: Neurology

## 2022-07-30 ENCOUNTER — Inpatient Hospital Stay: Payer: BC Managed Care – PPO | Admitting: Physician Assistant

## 2022-07-30 ENCOUNTER — Inpatient Hospital Stay: Payer: BC Managed Care – PPO | Attending: Physician Assistant

## 2022-07-30 ENCOUNTER — Inpatient Hospital Stay: Payer: BC Managed Care – PPO

## 2022-07-30 DIAGNOSIS — D751 Secondary polycythemia: Secondary | ICD-10-CM | POA: Diagnosis not present

## 2022-07-30 LAB — CBC
HCT: 44.2 % (ref 39.0–52.0)
Hemoglobin: 15.3 g/dL (ref 13.0–17.0)
MCH: 32.3 pg (ref 26.0–34.0)
MCHC: 34.6 g/dL (ref 30.0–36.0)
MCV: 93.4 fL (ref 80.0–100.0)
Platelets: 185 10*3/uL (ref 150–400)
RBC: 4.73 MIL/uL (ref 4.22–5.81)
RDW: 13.3 % (ref 11.5–15.5)
WBC: 7.6 10*3/uL (ref 4.0–10.5)
nRBC: 0 % (ref 0.0–0.2)

## 2022-07-30 NOTE — Progress Notes (Signed)
Hct 44.2 today with no phlebotomy needed per providers note.  Patient left clinic with no s/s of distress noted and no complaints voiced.

## 2022-08-13 ENCOUNTER — Ambulatory Visit (HOSPITAL_COMMUNITY)
Admission: RE | Admit: 2022-08-13 | Discharge: 2022-08-13 | Disposition: A | Discharge status: Home / Self Care | Payer: BC Managed Care – PPO | Source: Ambulatory Visit | Attending: Physician Assistant | Admitting: Physician Assistant

## 2022-08-13 VITALS — BP 118/66 | HR 60 | Ht 71.0 in | Wt 285.6 lb

## 2022-08-13 DIAGNOSIS — G4733 Obstructive sleep apnea (adult) (pediatric): Secondary | ICD-10-CM | POA: Insufficient documentation

## 2022-08-13 DIAGNOSIS — I10 Essential (primary) hypertension: Secondary | ICD-10-CM | POA: Insufficient documentation

## 2022-08-13 DIAGNOSIS — Z6839 Body mass index (BMI) 39.0-39.9, adult: Secondary | ICD-10-CM | POA: Diagnosis not present

## 2022-08-13 DIAGNOSIS — I480 Paroxysmal atrial fibrillation: Secondary | ICD-10-CM | POA: Diagnosis not present

## 2022-08-13 DIAGNOSIS — D6869 Other thrombophilia: Secondary | ICD-10-CM

## 2022-08-13 DIAGNOSIS — Z79899 Other long term (current) drug therapy: Secondary | ICD-10-CM | POA: Diagnosis not present

## 2022-08-13 DIAGNOSIS — Z5181 Encounter for therapeutic drug level monitoring: Secondary | ICD-10-CM

## 2022-08-13 DIAGNOSIS — E669 Obesity, unspecified: Secondary | ICD-10-CM | POA: Diagnosis not present

## 2022-08-13 NOTE — Progress Notes (Signed)
Primary Care Physician: Kirstie Peri, MD Primary Cardiologist: Dr Jens Som Primary Electrophysiologist: Dr Lalla Brothers Referring Physician: Dr Lucina Mellow Albert [name] is a 52 y.o. male with a history of DM, HTN, GERD, OSA, and paroxysmal atrial fibrillation who presents for follow up in the [facility]. The patient was initially diagnosed with atrial fibrillation 12/25/19 after presenting to the ED with symptoms of palpitations, SOB, and some chest heaviness. He states he had brief, intermittent palpitations for two weeks prior. Patient was started on Eliquis for a CHADS2VASC score of 2. He converted back to SR spontaneously in the ED. Since then, he has had some mild palpitations almost daily. He did have another extended episode of heart racing on 10/8 which resolved when he took his BB. He is compliant with his CPAP and denies any alcohol use.   Patient was hospitalized 11/28/20 for a suspected CVA. He lost consciousness while watching TV for 10 seconds and when he woke he  had right sided numbness and limb heaviness. Imaging showed no new infarct but it did show evidence of a prior CVA. Etiology was unclear with question for TIA. Patient was also seen at the ED 12/12/20 for afib lasting 2-3 hours. He converted to SR prior to arrival.  On follow up today, patient reports that he has done well since his last visit. He has not had any palpitations > 2 months. He has been working on diet and exercise and has last ~ 35 lbs.   Today, he denies symptoms of palpitations, chest pain, shortness of breath, orthopnea, PND, lower extremity edema, dizziness, presyncope, syncope, bleeding, or neurologic sequela. The patient is tolerating medications without difficulties and is otherwise without complaint today.    Atrial Fibrillation Risk Factors:  he does have symptoms or diagnosis of sleep apnea. he is compliant with CPAP therapy. he does not have a history of rheumatic  fever. he does not have a history of alcohol use. The patient does not have a history of early familial atrial fibrillation or other arrhythmias.  he has a BMI of Body mass index is 39.83 kg/m.[name] Filed Weights   08/13/22 0831  Weight: 129.5 kg   Family History  Problem Relation Age of Onset   Colon cancer Father     Atrial Fibrillation Management history:  Previous antiarrhythmic drugs: Multaq Previous cardioversions: none Previous ablations: none Anticoagulation history: Eliquis   Past Medical History:  Diagnosis Date   Atrial fibrillation (HCC)    CVA (cerebral vascular accident) (HCC) 2022   Diabetes mellitus without complication (HCC)    GERD (gastroesophageal reflux disease)    Gout    Hypertension    Low testosterone    Polycythemia, 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   Sleep apnea    Past Surgical History:  Procedure Laterality Date   CARDIAC CATHETERIZATION     COLONOSCOPY     COLONOSCOPY N/A 05/11/2015   Procedure: COLONOSCOPY;  Surgeon: Malissa Hippo, MD;  [facility];  Service: Endoscopy;  Laterality: N/A;  930 - moved to 2/15 - Ann to notify   COLONOSCOPY WITH PROPOFOL N/A 09/07/2020   Procedure: COLONOSCOPY WITH PROPOFOL;  Surgeon: Malissa Hippo, MD;  [facility];  Service: Endoscopy;  Laterality: N/A;  am   LAPAROSCOPIC APPENDECTOMY N/A 12/22/2021   Procedure: APPENDECTOMY LAPAROSCOPIC;  Surgeon: Franky Macho, MD;  Location: [facility];  Service: General;  Laterality: N/A;   POLYPECTOMY  09/07/2020   Procedure: POLYPECTOMY;  Surgeon: Lionel December  U, MD;  Location: [facility];  Service: Endoscopy;;    Current Outpatient Medications  Medication Sig Dispense Refill   acetaminophen (TYLENOL) 650 MG CR tablet Take 650 mg by mouth every 8 (eight) hours as needed for pain.     apixaban (ELIQUIS) 5 MG TABS tablet Take 1 tablet (5 mg total) by mouth 2 (two) times daily. 180 tablet 2   clobetasol cream (TEMOVATE) 0.05 % Apply 1 Application  topically as needed.     colchicine 0.6 MG tablet Take 0.6 mg by mouth daily. As needed for gout     diltiazem (CARDIZEM) 30 MG tablet TAKE 1 TABLET EVERY 4 HOURS AS NEEDED FOR AFIB HEART RATE GREATER THAN 100 30 tablet 0   dronedarone (MULTAQ) 400 MG tablet TAKE 1 TABLET BY MOUTH 2 TIMES A DAY WITH A MEAL. 60 tablet 6   febuxostat (ULORIC) 40 MG tablet Take 40 mg by mouth daily.     fluticasone (FLONASE) 50 MCG/ACT nasal spray Place 1 spray into both nostrils daily as needed for allergies or rhinitis.     Magnesium 200 MG TABS Take 800 mg by mouth daily. Taking 4 tablets by mouth daily- 800mg  total     meloxicam (MOBIC) 15 MG tablet Take 15 mg by mouth as needed for pain.     metoprolol tartrate (LOPRESSOR) 25 MG tablet Take 1 tablet (25 mg total) by mouth 2 (two) times daily. 180 tablet 2   pantoprazole (PROTONIX) 40 MG tablet Take 1 tablet (40 mg total) by mouth daily. 30 tablet 0   predniSONE (DELTASONE) 5 MG tablet Take 5 mg by mouth as needed (As needed for gout flare ups).     tadalafil (CIALIS) 20 MG tablet Take 20 mg by mouth daily as needed for erectile dysfunction.     testosterone cypionate (DEPOTESTOSTERONE CYPIONATE) 200 MG/ML injection Inject 100 mg into the muscle. Every 10 Days     No current facility-administered medications for this encounter.    Allergies  Allergen Reactions   Penicillins Other (See Comments)    Unknown childhood   Levaquin [Levofloxacin] Palpitations    Social History   Socioeconomic History   Marital status: Married    Spouse name: Gunnar Fusi   Number of children: Not on file   Years of education: Not on file   Highest education level: Master's degree (e.g., MA, MS, MEng, MEd, MSW, MBA)  Occupational History    Comment: accounting  Tobacco Use   Smoking status: Never   Smokeless tobacco: Never   Tobacco comments:    Never smoke 02/13/22  Vaping Use   Vaping Use: Never used  Substance and Sexual Activity   Alcohol use: Not Currently     Comment: occasionally   Drug use: No    Comment: reports use of CBG gummies   Sexual activity: Not on file  Other Topics Concern   Not on file  Social History Narrative   Lives with wife   Right Handed   2 Sodas daily   Social Determinants of Health   Financial Resource Strain: Not on file  Food Insecurity: No Food Insecurity (12/21/2021)   Hunger Vital Sign    Worried About Running Out of Food in the Last Year: Never true    Ran Out of Food in the Last Year: Never true  Transportation Needs: No Transportation Needs (12/21/2021)   PRAPARE - Administrator, Civil Service (Medical): No    Lack of Transportation (Non-Medical): No  Physical Activity: Not on file  Stress: Not on file  Social Connections: Not on file  Intimate Partner Violence: Not At Risk (12/21/2021)   Humiliation, Afraid, Rape, and Kick questionnaire    Fear of Current or Ex-Partner: No    Emotionally Abused: No    Physically Abused: No    Sexually Abused: No     ROS- All systems are reviewed and negative except as per the HPI above.  Physical Exam: Vitals:   08/13/22 0831  BP: 118/66  Pulse: 60  Weight: 129.5 kg  Height: 5\' 11"  (1.803 m)    GEN- The patient is a well appearing male, alert and oriented x 3 today.   HEENT-head normocephalic, atraumatic, sclera clear, conjunctiva pink, hearing intact, trachea midline. Lungs- Clear to ausculation bilaterally, normal work of breathing Heart- Regular rate and rhythm, no murmurs, rubs or gallops  GI- soft, NT, ND, + BS Extremities- no clubbing, cyanosis, or edema MS- no significant deformity or atrophy Skin- no rash or lesion Psych- euthymic mood, full affect Neuro- strength and sensation are intact   Wt Readings from Last 3 Encounters:  08/13/22 129.5 kg  04/23/22 (!) 145.6 kg  02/20/22 (!) 145.5 kg    EKG today demonstrates  SR Vent. rate 60 BPM PR interval 198 ms QRS duration 96 ms QT/QTcB 434/434 ms   Echo 11/29/20 demonstrated   1. Left ventricular ejection fraction, by estimation, is 55 to 60%. The  left ventricle has normal function. The left ventricle has no regional  wall motion abnormalities. Left ventricular diastolic parameters were  normal.   2. Right ventricular systolic function was not well visualized. The right ventricular size is not well visualized.   3. The mitral valve is grossly normal. Trivial mitral valve  regurgitation.   4. The aortic valve is grossly normal. Aortic valve regurgitation is not visualized. No aortic stenosis is present.    Epic records are reviewed at length today  CHA2DS2-VASc Score = 4  The patient's score is based upon: CHF History: 0 HTN History: 1 Diabetes History: 1 Stroke History: 2 Vascular Disease History: 0 Age Score: 0 Gender Score: 0        ASSESSMENT AND PLAN: 1. Paroxysmal Atrial Fibrillation (ICD10:  I48.0) The patient's CHA2DS2-VASc score is 4, indicating a 4.8% annual risk of stroke.   Patient appears to be maintaining SR.  He will continue to focus on diet and exercise.  If he has frequent recurrence of his afib, he may be candidate for ablation now that his BMI is < 40.  Continue Eliquis 5 mg BID Continue Lopressor 25 mg BID Continue Multaq 400 mg BID Continue diltiazem 30 mg PRN q 4 hours for heat racing.   2. Secondary Hypercoagulable State (ICD10:  D68.69) The patient is at significant risk for stroke/thromboembolism based upon his CHA2DS2-VASc Score of 4.  Continue Apixaban (Eliquis).   3. Obesity Body mass index is 39.83 kg/m. Patient deferred referral to [facility] clinic. Lifestyle modification was discussed and encouraged including regular physical activity and weight reduction. He is doing well with weight loss.   4. Obstructive sleep apnea Followed by Dr Frances Furbish. Encouraged compliance with CPAP therapy.  5. HTN Stable, no changes today.   Follow up in the AF clinic in 6 months.    Jorja Loa PA-C [facility] [address] [place], [place] [address] [phone] 08/13/2022 8:50 AM

## 2022-08-14 DIAGNOSIS — F419 Anxiety disorder, unspecified: Secondary | ICD-10-CM | POA: Diagnosis not present

## 2022-09-07 DIAGNOSIS — M779 Enthesopathy, unspecified: Secondary | ICD-10-CM | POA: Diagnosis not present

## 2022-09-07 DIAGNOSIS — M10072 Idiopathic gout, left ankle and foot: Secondary | ICD-10-CM | POA: Diagnosis not present

## 2022-09-24 DIAGNOSIS — Z125 Encounter for screening for malignant neoplasm of prostate: Secondary | ICD-10-CM | POA: Diagnosis not present

## 2022-09-24 DIAGNOSIS — E291 Testicular hypofunction: Secondary | ICD-10-CM | POA: Diagnosis not present

## 2022-10-01 DIAGNOSIS — E291 Testicular hypofunction: Secondary | ICD-10-CM | POA: Diagnosis not present

## 2022-10-01 DIAGNOSIS — N5201 Erectile dysfunction due to arterial insufficiency: Secondary | ICD-10-CM | POA: Diagnosis not present

## 2022-10-08 ENCOUNTER — Other Ambulatory Visit (HOSPITAL_COMMUNITY): Payer: Self-pay | Admitting: *Deleted

## 2022-10-08 MED ORDER — MULTAQ 400 MG PO TABS: 400.0000 mg | ORAL_TABLET | Freq: Two times a day (BID) | ORAL | 6 refills | Status: DC

## 2022-10-08 MED ORDER — DILTIAZEM HCL 30 MG PO TABS: ORAL_TABLET | ORAL | 1 refills | Status: DC

## 2022-10-18 DIAGNOSIS — M10021 Idiopathic gout, right elbow: Secondary | ICD-10-CM | POA: Diagnosis not present

## 2022-10-18 DIAGNOSIS — M779 Enthesopathy, unspecified: Secondary | ICD-10-CM | POA: Diagnosis not present

## 2022-11-15 DIAGNOSIS — E1122 Type 2 diabetes mellitus with diabetic chronic kidney disease: Secondary | ICD-10-CM | POA: Diagnosis not present

## 2022-11-15 DIAGNOSIS — M109 Gout, unspecified: Secondary | ICD-10-CM | POA: Diagnosis not present

## 2022-11-15 DIAGNOSIS — I10 Essential (primary) hypertension: Secondary | ICD-10-CM | POA: Diagnosis not present

## 2022-11-15 DIAGNOSIS — N1831 Chronic kidney disease, stage 3a: Secondary | ICD-10-CM | POA: Diagnosis not present

## 2022-11-15 DIAGNOSIS — Z299 Encounter for prophylactic measures, unspecified: Secondary | ICD-10-CM | POA: Diagnosis not present

## 2023-01-23 DIAGNOSIS — Z299 Encounter for prophylactic measures, unspecified: Secondary | ICD-10-CM | POA: Diagnosis not present

## 2023-01-23 DIAGNOSIS — F419 Anxiety disorder, unspecified: Secondary | ICD-10-CM | POA: Diagnosis not present

## 2023-01-23 DIAGNOSIS — I4891 Unspecified atrial fibrillation: Secondary | ICD-10-CM | POA: Diagnosis not present

## 2023-01-23 DIAGNOSIS — D6869 Other thrombophilia: Secondary | ICD-10-CM | POA: Diagnosis not present

## 2023-01-23 DIAGNOSIS — Z1331 Encounter for screening for depression: Secondary | ICD-10-CM | POA: Diagnosis not present

## 2023-01-23 DIAGNOSIS — Z0000 Encounter for general adult medical examination without abnormal findings: Secondary | ICD-10-CM | POA: Diagnosis not present

## 2023-01-23 DIAGNOSIS — I10 Essential (primary) hypertension: Secondary | ICD-10-CM | POA: Diagnosis not present

## 2023-01-27 NOTE — Progress Notes (Unsigned)
[facility] 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  He was last seen by Rojelio Brenner PA-C on 01/29/2022.  He has not required phlebotomy since 01/23/2021 due to Hgb/HCT at goal.  At today's visit, he reports feeling fairly well.  He has not had any hospitalizations or changes in his baseline health status.  No fatigue, fever, chills, shortness of breath, cough, chest pain, nausea, vomiting.  He continues to remain on testosterone 100 mg every 10 days.  He continues to be compliant with his CPAP machine.   No strokelike symptoms since his TIA in September 2022.   No history of blood clots or symptoms of current DVT/PE.   No pruritus, Raynaud's phenomenon, or erythromelalgia.   No vasomotor symptoms such as dizziness, tinnitus, blurry vision, headache, neuropathy, or strokelike symptoms.   No B symptoms, abdominal pain satiety.  He remains on Eliquis for atrial fibrillation.  He denies any major bleeding events such as rectal bleeding or melena. He has 100% energy and 100% appetite. He endorses that he is maintaining a stable weight.   ASSESSMENT & PLAN:  1.  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  He was previously taking testosterone 200 mg every 10 days, but the dose was decreased by his urologist after he had onset of polycythemia.  Ever since dose was decreased, his blood counts have been within target range. - Patient has OSA, is compliant with CPAP - No history of DVT or  PE.  Suspected TIA on 11/28/2020 (patient questions diagnosis of TIA, thinks that his symptoms may have been due to [facility] Gummies that he was taking to help him sleep at night) - MPN work-up negative for any mutations of JAK2, CALR, MPL. - Erythropoietin was mildly elevated, and renal ultrasound (02/23/2021) showed 2.7 cm hypoechoic mass in right kidney.  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 - He is on Eliquis due to atrial fibrillation.  He does not take aspirin. - Requires intermittent phlebotomy, Hgb/HCT has been at goal since last phlebotomy on 01/23/2021. - Currently asymptomatic.  No aquagenic pruritus or vasomotor symptoms. - CBC today (01/28/23): Hgb 16.8/HCT 47.9%, normal CBC. - PLAN: Since patient's blood counts have returned to normal after being placed on decreased dose of testosterone, we will tentatively discharge him from clinic at this time.  Patient is agreeable with this plan. - If he has any recurrent erythrocytosis or polycythemia, or is placed back on higher dose testosterone, he should contact our office to reestablish care.  2.  Other history - He is on Eliquis for atrial fibrillation. - Most recent colonoscopy 09/07/2020 with multiple polyps and external and internal hemorrhoids. - He lives at home with his wife.  He is a Chemical engineer.  He is a lifelong non-smoker. - No family history of polycythemia.  Father had colon cancer in his mid 64s.  Mother had cervical cancer in her 30s.  Maternal grandfather had bladder cancer.   PLAN SUMMARY:  >> Tentative  discharge from clinic     REVIEW OF SYSTEMS: No acute complaints at today's visit.  Review of Systems  Constitutional:  Negative for appetite change, chills, diaphoresis, fatigue, fever and unexpected weight change.  HENT:   Negative for lump/mass and nosebleeds.   Eyes:  Negative  for eye problems.  Respiratory:  Negative for cough, hemoptysis and shortness of breath.   Cardiovascular:  Negative for chest pain, leg swelling and palpitations.  Gastrointestinal:  Negative for abdominal pain, blood in stool, constipation, diarrhea, nausea and vomiting.  Genitourinary:  Negative for hematuria.   Skin: Negative.   Neurological:  Negative for dizziness, headaches and light-headedness.  Hematological:  Does not bruise/bleed easily.     PHYSICAL EXAM:  ECOG PERFORMANCE STATUS: 0 - Asymptomatic  Vitals:   01/28/23 1327  BP: 114/82  Pulse: 63  Resp: 17  Temp: 98.5 F (36.9 C)  SpO2: 99%   Filed Weights   01/28/23 1327  Weight: 269 lb 6.4 oz (122.2 kg)   Physical Exam Constitutional:      Appearance: Normal appearance. He is obese.  Cardiovascular:     Heart sounds: Normal heart sounds.  Pulmonary:     Breath sounds: Rhonchi (left posterior lung fiuelds, cleared by cough) present.  Neurological:     General: No focal deficit present.     Mental Status: Mental status is at baseline.  Psychiatric:        Behavior: Behavior normal. Behavior is cooperative.     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   Sleep apnea    Past Surgical History:  Procedure Laterality Date   CARDIAC CATHETERIZATION     COLONOSCOPY     COLONOSCOPY N/A 05/11/2015   Procedure: COLONOSCOPY;  Surgeon: Malissa Hippo, MD;  [facility];  Service: Endoscopy;  Laterality: N/A;  930 - moved to 2/15 - Ann to notify   COLONOSCOPY WITH PROPOFOL N/A 09/07/2020   Procedure: COLONOSCOPY WITH PROPOFOL;  Surgeon: Malissa Hippo, MD;  [facility];  Service: Endoscopy;  Laterality: N/A;  am    LAPAROSCOPIC APPENDECTOMY N/A 12/22/2021   Procedure: APPENDECTOMY LAPAROSCOPIC;  Surgeon: Franky Macho, MD;  Location: [facility];  Service: General;  Laterality: N/A;   POLYPECTOMY  09/07/2020   Procedure: POLYPECTOMY;  Surgeon: Malissa Hippo, MD;  Location: [facility];  Service: Endoscopy;;    SOCIAL HISTORY:  Social History   Socioeconomic History   Marital status: Married    Spouse name: Gunnar Fusi   Number of children: Not on file   Years of education: Not on file   Highest education level: Master's degree (e.g., MA, MS, MEng, MEd, MSW, MBA)  Occupational History    Comment: accounting  Tobacco Use   Smoking status: Never   Smokeless tobacco: Never   Tobacco comments:    Never smoke 02/13/22  Vaping Use   Vaping status: Never Used  Substance and Sexual Activity   Alcohol use: Not Currently    Comment: occasionally   Drug use: No    Comment: reports use of CBG gummies   Sexual activity: Not on file  Other Topics Concern   Not on file  Social History Narrative  Lives with wife   Right Handed   2 Sodas daily   Social Determinants of Health   Financial Resource Strain: Not on file  Food Insecurity: No Food Insecurity (12/21/2021)   Hunger Vital Sign    Worried About Running Out of Food in the Last Year: Never true    Ran Out of Food in the Last Year: Never true  Transportation Needs: No Transportation Needs (12/21/2021)   PRAPARE - Administrator, Civil Service (Medical): No    Lack of Transportation (Non-Medical): No  Physical Activity: Not on file  Stress: Not on file  Social Connections: Not on file  Intimate Partner Violence: Not At Risk (12/21/2021)   Humiliation, Afraid, Rape, and Kick questionnaire    Fear of Current or Ex-Partner: No    Emotionally Abused: No    Physically Abused: No    Sexually Abused: No    FAMILY HISTORY:  Family History  Problem Relation Age of Onset   Colon cancer Father     CURRENT MEDICATIONS:  Outpatient  Encounter Medications as of 01/28/2023  Medication Sig   acetaminophen (TYLENOL) 650 MG CR tablet Take 650 mg by mouth every 8 (eight) hours as needed for pain.   apixaban (ELIQUIS) 5 MG TABS tablet Take 1 tablet (5 mg total) by mouth 2 (two) times daily.   clobetasol cream (TEMOVATE) 0.05 % Apply 1 Application topically as needed.   colchicine 0.6 MG tablet Take 0.6 mg by mouth daily. As needed for gout   diltiazem (CARDIZEM) 30 MG tablet TAKE 1 TABLET EVERY 4 HOURS AS NEEDED FOR AFIB HEART RATE GREATER THAN 100   dronedarone (MULTAQ) 400 MG tablet Take 1 tablet (400 mg total) by mouth 2 (two) times daily with a meal.   febuxostat (ULORIC) 40 MG tablet Take 40 mg by mouth daily.   fluticasone (FLONASE) 50 MCG/ACT nasal spray Place 1 spray into both nostrils daily as needed for allergies or rhinitis.   Magnesium 200 MG TABS Take 800 mg by mouth daily. Taking 4 tablets by mouth daily- 800mg  total   meloxicam (MOBIC) 15 MG tablet Take 15 mg by mouth as needed for pain.   metoprolol tartrate (LOPRESSOR) 25 MG tablet Take 1 tablet (25 mg total) by mouth 2 (two) times daily.   pantoprazole (PROTONIX) 40 MG tablet Take 1 tablet (40 mg total) by mouth daily.   predniSONE (DELTASONE) 5 MG tablet Take 5 mg by mouth as needed (As needed for gout flare ups).   tadalafil (CIALIS) 20 MG tablet Take 20 mg by mouth daily as needed for erectile dysfunction.   testosterone cypionate (DEPOTESTOSTERONE CYPIONATE) 200 MG/ML injection Inject 100 mg into the muscle. Every 10 Days   No facility-administered encounter medications on file as of 01/28/2023.    ALLERGIES:  Allergies  Allergen Reactions   Penicillins Other (See Comments)    Unknown childhood   Levaquin [Levofloxacin] Palpitations    LABORATORY DATA:  I have reviewed the labs as listed.  CBC    Component Value Date/Time   WBC 9.1 01/28/2023 1300   RBC 5.12 01/28/2023 1300   HGB 16.8 01/28/2023 1300   HCT 47.9 01/28/2023 1300   PLT 212  01/28/2023 1300   MCV 93.6 01/28/2023 1300   MCH 32.8 01/28/2023 1300   MCHC 35.1 01/28/2023 1300   RDW 13.2 01/28/2023 1300   LYMPHSABS 2.2 05/18/2021 1146   MONOABS 0.9 05/18/2021 1146   EOSABS 0.4 05/18/2021 1146   BASOSABS 0.1  05/18/2021 1146      Latest Ref Rng & Units 12/23/2021    5:13 AM 12/21/2021    4:16 AM 12/12/2020   10:54 PM  CMP  Glucose 70 - 99 mg/dL 425  956  387   BUN 6 - 20 mg/dL 21  35  36   Creatinine 0.61 - 1.24 mg/dL 5.64  3.32  9.51   Sodium 135 - 145 mmol/L 137  132  138   Potassium 3.5 - 5.1 mmol/L 4.3  4.2  4.0   Chloride 98 - 111 mmol/L 103  100  106   CO2 22 - 32 mmol/L 26  23  25    Calcium 8.9 - 10.3 mg/dL 8.5  8.9  9.2     DIAGNOSTIC IMAGING:  I have independently reviewed the relevant imaging and discussed with the patient.   WRAP UP:  All questions were answered. The patient knows to call the clinic with any problems, questions or concerns.  Medical decision making: Low  Time spent on visit: I spent 15 minutes counseling the patient face to face. The total time spent in the appointment was 22 minutes and more than 50% was on counseling.  Carnella Guadalajara, PA-C  01/28/23 2:08 PM

## 2023-01-28 ENCOUNTER — Other Ambulatory Visit (HOSPITAL_COMMUNITY)
Admission: RE | Admit: 2023-01-28 | Discharge: 2023-01-28 | Disposition: A | Discharge status: Home / Self Care | Payer: BC Managed Care – PPO | Source: Ambulatory Visit | Attending: Physician Assistant | Admitting: Physician Assistant

## 2023-01-28 ENCOUNTER — Inpatient Hospital Stay: Payer: BC Managed Care – PPO | Attending: Physician Assistant

## 2023-01-28 ENCOUNTER — Inpatient Hospital Stay: Payer: BC Managed Care – PPO

## 2023-01-28 ENCOUNTER — Inpatient Hospital Stay (HOSPITAL_BASED_OUTPATIENT_CLINIC_OR_DEPARTMENT_OTHER): Payer: BC Managed Care – PPO | Admitting: Physician Assistant

## 2023-01-28 VITALS — BP 114/82 | HR 63 | Temp 98.5°F | Resp 17 | Ht 71.0 in | Wt 269.4 lb

## 2023-01-28 DIAGNOSIS — D751 Secondary polycythemia: Secondary | ICD-10-CM

## 2023-01-28 DIAGNOSIS — Z79890 Hormone replacement therapy (postmenopausal): Secondary | ICD-10-CM | POA: Insufficient documentation

## 2023-01-28 LAB — CBC
HCT: 47.9 % (ref 39.0–52.0)
Hemoglobin: 16.8 g/dL (ref 13.0–17.0)
MCH: 32.8 pg (ref 26.0–34.0)
MCHC: 35.1 g/dL (ref 30.0–36.0)
MCV: 93.6 fL (ref 80.0–100.0)
Platelets: 212 10*3/uL (ref 150–400)
RBC: 5.12 MIL/uL (ref 4.22–5.81)
RDW: 13.2 % (ref 11.5–15.5)
WBC: 9.1 10*3/uL (ref 4.0–10.5)
nRBC: 0 % (ref 0.0–0.2)

## 2023-01-28 NOTE — Progress Notes (Signed)
No phlebotomy needed due to parameters.

## 2023-01-28 NOTE — Patient Instructions (Signed)
[facility] at [facility] Discharge Instructions  You were seen today by Rojelio Brenner PA-C for your elevated hemoglobin/elevated hematocrit.  These levels were most likely elevated due to your testosterone supplement.  Your blood levels have remained normal ever since your testosterone dose was decreased.  At this time, it is reasonable to discharge you from our clinic.  However, we would be happy to see you again in the future as needed: If you have any recurrent elevations in hemoglobin / red blood cells. If you go back onto an increased dose of testosterone.  ** Thank you for trusting me with your healthcare!  I strive to provide all of my patients with quality care at each visit.  If you receive a survey for this visit, I would be so grateful to you for taking the time to provide feedback.  Thank you in advance!  ~ Pratham Cassatt                   Dr. Doreatha Massed   &   Rojelio Brenner, PA-C   - - - - - - - - - - - - - - - - - -     Thank you for choosing [facility] at [facility] to provide your oncology and hematology care.  To afford each patient quality time with our provider, please arrive at least 15 minutes before your scheduled appointment time.   If you have a lab appointment with the Cancer Center please come in thru the Main Entrance and check in at the main information desk.  You need to re-schedule your appointment should you arrive 10 or more minutes late.  We strive to give you quality time with our providers, and arriving late affects you and other patients whose appointments are after yours.  Also, if you no show three or more times for appointments you may be dismissed from the clinic at the providers discretion.     Again, thank you for choosing [facility].  Our hope is that these requests will decrease the amount of time that you wait before being seen by our physicians.        _____________________________________________________________  Should you have questions after your visit to [facility], please contact our office at [phone] and follow the prompts.  Our office hours are 8:00 a.m. and 4:30 p.m. Monday - Friday.  Please note that voicemails left after 4:00 p.m. may not be returned until the following business day.  We are closed weekends and major holidays.  You do have access to a nurse 24-7, just call the main number to the clinic [phone] and do not press any options, hold on the line and a nurse will answer the phone.    For prescription refill requests, have your pharmacy contact our office and allow 72 hours.    Due to Covid, you will need to wear a mask upon entering the hospital. If you do not have a mask, a mask will be given to you at the Main Entrance upon arrival. For doctor visits, patients may have 1 support person age 15 or older with them. For treatment visits, patients can not have anyone with them due to social distancing guidelines and our immunocompromised population.

## 2023-02-04 ENCOUNTER — Ambulatory Visit (HOSPITAL_COMMUNITY)
Admission: RE | Admit: 2023-02-04 | Discharge: 2023-02-04 | Disposition: A | Discharge status: Home / Self Care | Payer: BC Managed Care – PPO | Source: Ambulatory Visit | Attending: Physician Assistant | Admitting: Physician Assistant

## 2023-02-04 ENCOUNTER — Encounter (HOSPITAL_COMMUNITY): Payer: Self-pay | Admitting: Physician Assistant

## 2023-02-04 VITALS — BP 102/60 | HR 62 | Ht 71.0 in | Wt 268.6 lb

## 2023-02-04 DIAGNOSIS — Z6841 Body Mass Index (BMI) 40.0 and over, adult: Secondary | ICD-10-CM | POA: Insufficient documentation

## 2023-02-04 DIAGNOSIS — G4733 Obstructive sleep apnea (adult) (pediatric): Secondary | ICD-10-CM | POA: Diagnosis not present

## 2023-02-04 DIAGNOSIS — Z5181 Encounter for therapeutic drug level monitoring: Secondary | ICD-10-CM | POA: Diagnosis not present

## 2023-02-04 DIAGNOSIS — D6869 Other thrombophilia: Secondary | ICD-10-CM | POA: Diagnosis not present

## 2023-02-04 DIAGNOSIS — K219 Gastro-esophageal reflux disease without esophagitis: Secondary | ICD-10-CM | POA: Insufficient documentation

## 2023-02-04 DIAGNOSIS — E669 Obesity, unspecified: Secondary | ICD-10-CM | POA: Insufficient documentation

## 2023-02-04 DIAGNOSIS — Z7901 Long term (current) use of anticoagulants: Secondary | ICD-10-CM | POA: Insufficient documentation

## 2023-02-04 DIAGNOSIS — Z79899 Other long term (current) drug therapy: Secondary | ICD-10-CM | POA: Diagnosis not present

## 2023-02-04 DIAGNOSIS — I10 Essential (primary) hypertension: Secondary | ICD-10-CM | POA: Diagnosis not present

## 2023-02-04 DIAGNOSIS — Z8673 Personal history of transient ischemic attack (TIA), and cerebral infarction without residual deficits: Secondary | ICD-10-CM | POA: Diagnosis not present

## 2023-02-04 DIAGNOSIS — I480 Paroxysmal atrial fibrillation: Secondary | ICD-10-CM | POA: Diagnosis not present

## 2023-02-04 NOTE — Progress Notes (Signed)
Primary Care Physician: Kirstie Peri, MD Primary Cardiologist: Dr Jens Som Primary Electrophysiologist: Dr Lalla Brothers Referring Physician: Dr Lucina Mellow Aleck Albert [name] is a 52 y.o. male with a history of DM, HTN, GERD, OSA, and paroxysmal atrial fibrillation who presents for follow up in the [facility]. The patient was initially diagnosed with atrial fibrillation 12/25/19 after presenting to the ED with symptoms of palpitations, SOB, and some chest heaviness. He states he had brief, intermittent palpitations for two weeks prior. Patient was started on Eliquis for a CHADS2VASC score of 2. He converted back to SR spontaneously in the ED. Since then, he has had some mild palpitations almost daily. He did have another extended episode of heart racing on 10/8 which resolved when he took his BB. He is compliant with his CPAP and denies any alcohol use.   Patient was hospitalized 11/28/20 for a suspected CVA. He lost consciousness while watching TV for 10 seconds and when he woke he  had right sided numbness and limb heaviness. Imaging showed no new infarct but it did show evidence of a prior CVA. Etiology was unclear with question for TIA. Patient was also seen at the ED 12/12/20 for afib lasting 2-3 hours. He converted to SR prior to arrival.  On follow up today, patient reports that he has done very well since his last visit. He has had no interim symptoms of afib. No bleeding issues on anticoagulation. He continues his diet and exercise efforts.   Today, he denies symptoms of palpitations, chest pain, shortness of breath, orthopnea, PND, lower extremity edema, dizziness, presyncope, syncope, bleeding, or neurologic sequela. The patient is tolerating medications without difficulties and is otherwise without complaint today.    Atrial Fibrillation Risk Factors:  he does have symptoms or diagnosis of sleep apnea. he is compliant with CPAP therapy. he does not have a  history of rheumatic fever. he does not have a history of alcohol use. The patient does not have a history of early familial atrial fibrillation or other arrhythmias.   Atrial Fibrillation Management history:  Previous antiarrhythmic drugs: Multaq Previous cardioversions: none Previous ablations: none Anticoagulation history: Eliquis   Past Medical History:  Diagnosis Date   Atrial fibrillation (HCC)    CVA (cerebral vascular accident) (HCC) 2022   Diabetes mellitus without complication (HCC)    GERD (gastroesophageal reflux disease)    Gout    Hypertension    Low testosterone    Polycythemia, 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   Sleep apnea     Current Outpatient Medications  Medication Sig Dispense Refill   acetaminophen (TYLENOL) 650 MG CR tablet Take 650 mg by mouth every 8 (eight) hours as needed for pain.     apixaban (ELIQUIS) 5 MG TABS tablet Take 1 tablet (5 mg total) by mouth 2 (two) times daily. 180 tablet 2   clobetasol cream (TEMOVATE) 0.05 % Apply 1 Application topically as needed.     colchicine 0.6 MG tablet Take 0.6 mg by mouth daily. As needed for gout     diltiazem (CARDIZEM) 30 MG tablet TAKE 1 TABLET EVERY 4 HOURS AS NEEDED FOR AFIB HEART RATE GREATER THAN 100 30 tablet 1   dronedarone (MULTAQ) 400 MG tablet Take 1 tablet (400 mg total) by mouth 2 (two) times daily with a meal. 60 tablet 6   febuxostat (ULORIC) 40 MG tablet Take 40 mg by mouth daily.     fluticasone (FLONASE) 50 MCG/ACT nasal spray Place 1 spray into both  nostrils daily as needed for allergies or rhinitis.     Magnesium 200 MG TABS Take 800 mg by mouth daily. Taking 4 tablets by mouth daily- 800mg  total     meloxicam (MOBIC) 15 MG tablet Take 15 mg by mouth as needed for pain.     metoprolol tartrate (LOPRESSOR) 25 MG tablet Take 1 tablet (25 mg total) by mouth 2 (two) times daily. 180 tablet 2   pantoprazole (PROTONIX) 40 MG tablet Take 1 tablet (40 mg total) by mouth daily. 30 tablet 0   predniSONE  (DELTASONE) 5 MG tablet Take 5 mg by mouth as needed (As needed for gout flare ups).     tadalafil (CIALIS) 20 MG tablet Take 20 mg by mouth daily as needed for erectile dysfunction.     testosterone cypionate (DEPOTESTOSTERONE CYPIONATE) 200 MG/ML injection Inject 100 mg into the muscle. Every 10 Days     No current facility-administered medications for this encounter.    ROS- All systems are reviewed and negative except as per the HPI above.  Physical Exam: Vitals:   02/04/23 0817  BP: 102/60  Pulse: 62  Weight: 121.8 kg  Height: 5\' 11"  (1.803 m)     GEN: Well nourished, well developed in no acute distress NECK: No JVD; No carotid bruits CARDIAC: Regular rate and rhythm, no murmurs, rubs, gallops RESPIRATORY:  Clear to auscultation without rales, wheezing or rhonchi  ABDOMEN: Soft, non-tender, non-distended EXTREMITIES:  No edema; No deformity    Wt Readings from Last 3 Encounters:  02/04/23 121.8 kg  01/28/23 122.2 kg  08/13/22 129.5 kg    EKG today demonstrates  SR Vent. rate 62 BPM PR interval 192 ms QRS duration 98 ms QT/QTcB 418/424 ms   Echo 11/29/20 demonstrated  1. Left ventricular ejection fraction, by estimation, is 55 to 60%. The  left ventricle has normal function. The left ventricle has no regional  wall motion abnormalities. Left ventricular diastolic parameters were  normal.   2. Right ventricular systolic function was not well visualized. The right ventricular size is not well visualized.   3. The mitral valve is grossly normal. Trivial mitral valve  regurgitation.   4. The aortic valve is grossly normal. Aortic valve regurgitation is not visualized. No aortic stenosis is present.    Epic records are reviewed at length today  CHA2DS2-VASc Score = 4  The patient's score is based upon: CHF History: 0 HTN History: 1 Diabetes History: 1 Stroke History: 2 Vascular Disease History: 0 Age Score: 0 Gender Score: 0        ASSESSMENT AND  PLAN: Paroxysmal Atrial Fibrillation (ICD10:  I48.0) The patient's CHA2DS2-VASc score is 4, indicating a 4.8% annual risk of stroke.   Patient appears to be maintaining SR If he has frequent recurrence of his afib, he may be candidate for ablation now that his BMI is < 40.  Continue Eliquis 5 mg BID Continue Lopressor 25 mg BID Continue Multaq 400 mg BID Continue diltiazem 30 mg PRN q 4 hours for heat racing.   Secondary Hypercoagulable State (ICD10:  D68.69) The patient is at significant risk for stroke/thromboembolism based upon his CHA2DS2-VASc Score of 4.  Continue Apixaban (Eliquis).   Obesity Body mass index is 37.46 kg/m.  Encouraged lifestyle modification Patient deferred referral to [facility] clinic. Patient has lost 57 lbs by his home scale, congratulated.   OSA  Encouraged nightly CPAP Followed by Dr Frances Furbish  HTN Stable on current regimen   Follow up in the  AF clinic in 6 months.    Jorja Loa PA-C Afib Clinic [facility] [address] [place], [place] [address] [phone] 02/04/2023 8:35 AM

## 2023-02-21 ENCOUNTER — Other Ambulatory Visit (HOSPITAL_COMMUNITY): Payer: Self-pay | Admitting: Physician Assistant

## 2023-02-25 ENCOUNTER — Ambulatory Visit: Payer: BC Managed Care – PPO | Admitting: Neurology

## 2023-03-26 DIAGNOSIS — E7800 Pure hypercholesterolemia, unspecified: Secondary | ICD-10-CM | POA: Diagnosis not present

## 2023-03-26 DIAGNOSIS — F419 Anxiety disorder, unspecified: Secondary | ICD-10-CM | POA: Diagnosis not present

## 2023-03-26 DIAGNOSIS — Z125 Encounter for screening for malignant neoplasm of prostate: Secondary | ICD-10-CM | POA: Diagnosis not present

## 2023-03-26 DIAGNOSIS — Z79899 Other long term (current) drug therapy: Secondary | ICD-10-CM | POA: Diagnosis not present

## 2023-03-26 DIAGNOSIS — Z0000 Encounter for general adult medical examination without abnormal findings: Secondary | ICD-10-CM | POA: Diagnosis not present

## 2023-03-28 ENCOUNTER — Other Ambulatory Visit (HOSPITAL_COMMUNITY): Payer: Self-pay | Admitting: *Deleted

## 2023-03-28 MED ORDER — MULTAQ 400 MG PO TABS: 400.0000 mg | ORAL_TABLET | Freq: Two times a day (BID) | ORAL | 6 refills | Status: DC

## 2023-03-28 MED ORDER — METOPROLOL TARTRATE 25 MG PO TABS: 25.0000 mg | ORAL_TABLET | Freq: Two times a day (BID) | ORAL | 2 refills | Status: DC

## 2023-03-28 MED ORDER — APIXABAN 5 MG PO TABS: 5.0000 mg | ORAL_TABLET | Freq: Two times a day (BID) | ORAL | 2 refills | Status: DC

## 2023-04-20 DIAGNOSIS — R030 Elevated blood-pressure reading, without diagnosis of hypertension: Secondary | ICD-10-CM | POA: Diagnosis not present

## 2023-04-20 DIAGNOSIS — J069 Acute upper respiratory infection, unspecified: Secondary | ICD-10-CM | POA: Diagnosis not present

## 2023-04-20 DIAGNOSIS — E669 Obesity, unspecified: Secondary | ICD-10-CM | POA: Diagnosis not present

## 2023-04-20 DIAGNOSIS — Z6837 Body mass index (BMI) 37.0-37.9, adult: Secondary | ICD-10-CM | POA: Diagnosis not present

## 2023-04-29 DIAGNOSIS — Z125 Encounter for screening for malignant neoplasm of prostate: Secondary | ICD-10-CM | POA: Diagnosis not present

## 2023-04-29 DIAGNOSIS — E291 Testicular hypofunction: Secondary | ICD-10-CM | POA: Diagnosis not present

## 2023-05-06 DIAGNOSIS — N5201 Erectile dysfunction due to arterial insufficiency: Secondary | ICD-10-CM | POA: Diagnosis not present

## 2023-05-06 DIAGNOSIS — E291 Testicular hypofunction: Secondary | ICD-10-CM | POA: Diagnosis not present

## 2023-05-10 DIAGNOSIS — G4733 Obstructive sleep apnea (adult) (pediatric): Secondary | ICD-10-CM | POA: Diagnosis not present

## 2023-05-13 DIAGNOSIS — I10 Essential (primary) hypertension: Secondary | ICD-10-CM | POA: Diagnosis not present

## 2023-05-13 DIAGNOSIS — N1831 Chronic kidney disease, stage 3a: Secondary | ICD-10-CM | POA: Diagnosis not present

## 2023-05-13 DIAGNOSIS — G4733 Obstructive sleep apnea (adult) (pediatric): Secondary | ICD-10-CM | POA: Diagnosis not present

## 2023-05-13 DIAGNOSIS — Z299 Encounter for prophylactic measures, unspecified: Secondary | ICD-10-CM | POA: Diagnosis not present

## 2023-05-13 DIAGNOSIS — I4891 Unspecified atrial fibrillation: Secondary | ICD-10-CM | POA: Diagnosis not present

## 2023-05-13 DIAGNOSIS — E1169 Type 2 diabetes mellitus with other specified complication: Secondary | ICD-10-CM | POA: Diagnosis not present

## 2023-08-01 ENCOUNTER — Encounter (INDEPENDENT_AMBULATORY_CARE_PROVIDER_SITE_OTHER): Payer: Self-pay | Admitting: *Deleted

## 2023-08-05 ENCOUNTER — Ambulatory Visit (HOSPITAL_COMMUNITY)
Admission: RE | Admit: 2023-08-05 | Discharge: 2023-08-05 | Disposition: A | Discharge status: Home / Self Care | Payer: BC Managed Care – PPO | Source: Ambulatory Visit | Attending: Physician Assistant | Admitting: Physician Assistant

## 2023-08-05 ENCOUNTER — Encounter (HOSPITAL_COMMUNITY): Payer: Self-pay | Admitting: Physician Assistant

## 2023-08-05 VITALS — BP 102/68 | HR 57 | Ht 71.0 in | Wt 273.4 lb

## 2023-08-05 DIAGNOSIS — Z125 Encounter for screening for malignant neoplasm of prostate: Secondary | ICD-10-CM | POA: Diagnosis not present

## 2023-08-05 DIAGNOSIS — D6869 Other thrombophilia: Secondary | ICD-10-CM

## 2023-08-05 DIAGNOSIS — E291 Testicular hypofunction: Secondary | ICD-10-CM | POA: Diagnosis not present

## 2023-08-05 DIAGNOSIS — I480 Paroxysmal atrial fibrillation: Secondary | ICD-10-CM | POA: Diagnosis not present

## 2023-08-05 DIAGNOSIS — Z79899 Other long term (current) drug therapy: Secondary | ICD-10-CM | POA: Diagnosis not present

## 2023-08-05 DIAGNOSIS — Z5181 Encounter for therapeutic drug level monitoring: Secondary | ICD-10-CM

## 2023-08-05 MED ORDER — DILTIAZEM HCL 60 MG PO TABS: ORAL_TABLET | ORAL | 1 refills | Status: DC

## 2023-08-05 NOTE — Progress Notes (Signed)
 Primary Care Physician: Theoplis Fix, MD Primary Cardiologist: Dr Audery Blazing Primary Electrophysiologist: Dr Marven Slimmer Referring Physician: Dr Maryelizabeth Smolder Albert [name] is a 53 y.o. male with a history of DM, HTN, GERD, OSA, and paroxysmal atrial fibrillation who presents for follow up in the [facility]. The patient was initially diagnosed with atrial fibrillation 12/25/19 after presenting to the ED with symptoms of palpitations, SOB, and some chest heaviness. He states he had brief, intermittent palpitations for two weeks prior. Patient was started on Eliquis  for a CHADS2VASC score of 2. He converted back to SR spontaneously in the ED. Since then, he has had some mild palpitations almost daily. He did have another extended episode of heart racing on 10/8 which resolved when he took his BB. He is compliant with his CPAP and denies any alcohol use.   Patient was hospitalized 11/28/20 for a suspected CVA. He lost consciousness while watching TV for 10 seconds and when he woke he  had right sided numbness and limb heaviness. Imaging showed no new infarct but it did show evidence of a prior CVA. Etiology was unclear with question for TIA. Patient was also seen at the ED 12/12/20 for afib lasting 2-3 hours. He converted to SR prior to arrival.  Patient returns for follow up for atrial fibrillation and Multaq  monitoring. He reports that in the past 1-2 months he has had more episodes of afib. The episodes can last about 2 hours. He does report being on prednisone during that time for gout and elbow tendonitis. No bleeding issues on anticoagulation.   Today, he  denies symptoms of chest pain, shortness of breath, orthopnea, PND, lower extremity edema, dizziness, presyncope, syncope, bleeding, or neurologic sequela. The patient is tolerating medications without difficulties and is otherwise without complaint today.    Atrial Fibrillation Risk Factors:  he does have symptoms  or diagnosis of sleep apnea. he does not have a history of rheumatic fever. he does not have a history of alcohol use. The patient does not have a history of early familial atrial fibrillation or other arrhythmias.   Atrial Fibrillation Management history:  Previous antiarrhythmic drugs: Multaq  Previous cardioversions: none Previous ablations: none Anticoagulation history: Eliquis    Past Medical History:  Diagnosis Date   Atrial fibrillation (HCC)    CVA (cerebral vascular accident) (HCC) 2022   Diabetes mellitus without complication (HCC)    GERD (gastroesophageal reflux disease)    Gout    Hypertension    Low testosterone    Polycythemia, 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   Sleep apnea     Current Outpatient Medications  Medication Sig Dispense Refill   acetaminophen  (TYLENOL ) 650 MG CR tablet Take 650 mg by mouth every 8 (eight) hours as needed for pain.     apixaban  (ELIQUIS ) 5 MG TABS tablet Take 1 tablet (5 mg total) by mouth 2 (two) times daily. 180 tablet 2   clobetasol cream (TEMOVATE) 0.05 % Apply 1 Application topically as needed.     colchicine 0.6 MG tablet Take 0.6 mg by mouth daily. As needed for gout     dronedarone  (MULTAQ ) 400 MG tablet Take 1 tablet (400 mg total) by mouth 2 (two) times daily with a meal. 60 tablet 6   febuxostat  (ULORIC ) 40 MG tablet Take 40 mg by mouth daily.     fluticasone  (FLONASE ) 50 MCG/ACT nasal spray Place 1 spray into both nostrils daily as needed for allergies or rhinitis.     Magnesium  200 MG  TABS Take 800 mg by mouth daily. Taking 4 tablets by mouth daily- 800mg  total     meloxicam (MOBIC) 15 MG tablet Take 15 mg by mouth as needed for pain.     metoprolol  tartrate (LOPRESSOR ) 25 MG tablet Take 1 tablet (25 mg total) by mouth 2 (two) times daily. 180 tablet 2   pantoprazole  (PROTONIX ) 40 MG tablet Take 1 tablet (40 mg total) by mouth daily. 30 tablet 0   predniSONE (DELTASONE) 5 MG tablet Take 5 mg by mouth as needed (As needed for gout  flare ups).     tadalafil (CIALIS) 20 MG tablet Take 20 mg by mouth daily as needed for erectile dysfunction.     testosterone cypionate (DEPOTESTOSTERONE CYPIONATE) 200 MG/ML injection Inject 100 mg into the muscle. Every 10 Days     diltiazem  (CARDIZEM ) 60 MG tablet TAKE 1 TABLET EVERY 4 HOURS AS NEEDED FOR AFIB HEART RATE GREATER THAN 100 45 tablet 1   No current facility-administered medications for this encounter.    ROS- All systems are reviewed and negative except as per the HPI above.  Physical Exam: Vitals:   08/05/23 0822  BP: 102/68  Pulse: (!) 57  Weight: 124 kg  Height: 5\' 11"  (1.803 m)     GEN: Well nourished, well developed in no acute distress CARDIAC: Regular rate and rhythm, no murmurs, rubs, gallops RESPIRATORY:  Clear to auscultation without rales, wheezing or rhonchi  ABDOMEN: Soft, non-tender, non-distended EXTREMITIES:  No edema; No deformity    Wt Readings from Last 3 Encounters:  08/05/23 124 kg  02/04/23 121.8 kg  01/28/23 122.2 kg    EKG today demonstrates  SB Vent. rate 57 BPM PR interval 196 ms QRS duration 98 ms QT/QTcB 428/416 ms   Echo 11/29/20 demonstrated  1. Left ventricular ejection fraction, by estimation, is 55 to 60%. The  left ventricle has normal function. The left ventricle has no regional  wall motion abnormalities. Left ventricular diastolic parameters were  normal.   2. Right ventricular systolic function was not well visualized. The right ventricular size is not well visualized.   3. The mitral valve is grossly normal. Trivial mitral valve  regurgitation.   4. The aortic valve is grossly normal. Aortic valve regurgitation is not visualized. No aortic stenosis is present.    Epic records are reviewed at length today  CHA2DS2-VASc Score = 4  The patient's score is based upon: CHF History: 0 HTN History: 1 Diabetes History: 1 Stroke History: 2 Vascular Disease History: 0 Age Score: 0 Gender Score: 0        ASSESSMENT AND PLAN: Paroxysmal Atrial Fibrillation (ICD10:  I48.0) The patient's CHA2DS2-VASc score is 4, indicating a 4.8% annual risk of stroke.   We discussed rhythm control options. Suspect recent increase in afib related to prednisone. If he continues to have frequent afib, would consider changing to flecainide vs ablation.  Continue Eliquis  5 mg Bid Continue Lopressor  25 mg BID Continue Multaq  400 mg BID Continue diltiazem  30 mg PRN q 4 hour for heart racing  Secondary Hypercoagulable State (ICD10:  D68.69) The patient is at significant risk for stroke/thromboembolism based upon his CHA2DS2-VASc Score of 4.  Continue Apixaban  (Eliquis ).   High Risk Medication Monitoring (ICD 10: Z79.899) Intervals on ECG acceptable for dronedarone  monitoring.      Obesity Body mass index is 38.13 kg/m.  Encouraged lifestyle modification  OSA  Encouraged nightly CPAP Followed by dr Omar Bibber  HTN Stable on current regimen  Follow up in the AF clinic in 6 months.    Myrtha Ates PA-C Afib Clinic [facility] [address] [place], [place] [address] [phone] 08/05/2023 9:00 AM

## 2023-08-23 ENCOUNTER — Telehealth: Payer: Self-pay

## 2023-08-23 NOTE — Telephone Encounter (Signed)
 Who is your primary care physician: Dr.Ashish Mason Sole  Reasons for the colonoscopy: HX colon polyps  Have you had a colonoscopy before?  Yes 09-07-2020 Dr.Rehman  Do you have family history of colon cancer? Yes Father and grandfather  Previous colonoscopy with polyps removed? Yes 09/07/2020   Do you have a history colorectal cancer?   no  Are you diabetic? If yes, Type 1 or Type 2?    no  Do you have a prosthetic or mechanical heart valve? no  Do you have a pacemaker/defibrillator?   no  Have you had endocarditis/atrial fibrillation? no  Have you had joint replacement within the last 12 months?  no  Do you tend to be constipated or have to use laxatives? no  Do you have any history of drugs or alchohol?  no  Do you use supplemental oxygen?  no  Have you had a stroke or heart attack within the last 6 months? no  Do you take weight loss medication?  no    Do you take any blood-thinning medications such as: (aspirin , warfarin, Plavix, Aggrenox)  yes  If yes we need the name, milligram, dosage and who is prescribing doctor Eliquis  5 mg daily Clint Finton PA-C  Current Outpatient Medications on File Prior to Visit  Medication Sig Dispense Refill   acetaminophen  (TYLENOL ) 650 MG CR tablet Take 650 mg by mouth every 8 (eight) hours as needed for pain.     apixaban  (ELIQUIS ) 5 MG TABS tablet Take 1 tablet (5 mg total) by mouth 2 (two) times daily. 180 tablet 2   clobetasol cream (TEMOVATE) 0.05 % Apply 1 Application topically as needed.     colchicine 0.6 MG tablet Take 0.6 mg by mouth daily. As needed for gout     diltiazem  (CARDIZEM ) 60 MG tablet TAKE 1 TABLET EVERY 4 HOURS AS NEEDED FOR AFIB HEART RATE GREATER THAN 100 45 tablet 1   dronedarone  (MULTAQ ) 400 MG tablet Take 1 tablet (400 mg total) by mouth 2 (two) times daily with a meal. 60 tablet 6   febuxostat  (ULORIC ) 40 MG tablet Take 40 mg by mouth daily.     fluticasone  (FLONASE ) 50 MCG/ACT nasal spray Place 1 spray into both  nostrils daily as needed for allergies or rhinitis.     Magnesium  200 MG TABS Take 800 mg by mouth daily. Taking 4 tablets by mouth daily- 800mg  total     meloxicam (MOBIC) 15 MG tablet Take 15 mg by mouth as needed for pain.     metoprolol  tartrate (LOPRESSOR ) 25 MG tablet Take 1 tablet (25 mg total) by mouth 2 (two) times daily. 180 tablet 2   pantoprazole  (PROTONIX ) 40 MG tablet Take 1 tablet (40 mg total) by mouth daily. 30 tablet 0   predniSONE (DELTASONE) 5 MG tablet Take 5 mg by mouth as needed (As needed for gout flare ups).     tadalafil (CIALIS) 20 MG tablet Take 20 mg by mouth daily as needed for erectile dysfunction.     testosterone cypionate (DEPOTESTOSTERONE CYPIONATE) 200 MG/ML injection Inject 100 mg into the muscle. Every 10 Days     No current facility-administered medications on file prior to visit.    Allergies  Allergen Reactions   Penicillins Other (See Comments)    Unknown childhood   Levaquin [Levofloxacin] Palpitations     Pharmacy: [facility]  Primary Insurance Name: [employer] [id number]  Best number where you can be reached: [phone]

## 2023-08-26 NOTE — Telephone Encounter (Signed)
 Medication clearance received and scanned into media.

## 2023-08-27 NOTE — Telephone Encounter (Signed)
 Ok to schedule.  Room Any . Also with extended bowel prep ( Miralax twice daily for 10 days  with complete prep the day before the procedure )   Thanks,  Dontavious Emily Faizan Kaylenn Civil, MD Gastroenterology and Hepatology [facility] Gastroenterology

## 2023-08-27 NOTE — Telephone Encounter (Signed)
Called pt, LMOVM to call back. 

## 2023-08-28 DIAGNOSIS — K090 Developmental odontogenic cysts: Secondary | ICD-10-CM | POA: Diagnosis not present

## 2023-09-03 NOTE — Telephone Encounter (Signed)
 LMOVM to call back. Letter mailed. ?

## 2023-09-05 DIAGNOSIS — F4323 Adjustment disorder with mixed anxiety and depressed mood: Secondary | ICD-10-CM | POA: Diagnosis not present

## 2023-09-26 DIAGNOSIS — F4323 Adjustment disorder with mixed anxiety and depressed mood: Secondary | ICD-10-CM | POA: Diagnosis not present

## 2023-10-22 NOTE — Telephone Encounter (Signed)
 Yes he can do miralax prep as long as he is also doing MIRALAX BID for 10 days also

## 2023-10-22 NOTE — Telephone Encounter (Signed)
 Patient called. He has been scheduled for 8/26. Aware he will need to do miralax BID 10 days prior to procedure. He does not want the gallon jug prep. He stated it makes him vomit each time he tries. He wants to do miralax for his prep. Please advise Dr. Cinderella if you are okay with this? Thanks!

## 2023-10-22 NOTE — Telephone Encounter (Signed)
 Pt aware. Instructions sent.

## 2023-10-22 NOTE — Telephone Encounter (Signed)
 Questionnaire from recall, no referral needed

## 2023-10-29 ENCOUNTER — Other Ambulatory Visit (HOSPITAL_COMMUNITY): Payer: Self-pay | Admitting: Physician Assistant

## 2023-10-31 DIAGNOSIS — K090 Developmental odontogenic cysts: Secondary | ICD-10-CM | POA: Diagnosis not present

## 2023-11-04 ENCOUNTER — Other Ambulatory Visit: Payer: Self-pay | Admitting: *Deleted

## 2023-11-14 ENCOUNTER — Telehealth: Payer: Self-pay | Admitting: *Deleted

## 2023-11-14 NOTE — Telephone Encounter (Signed)
 Informed pt that provider does not have any appointments for September. He states he can wait until October. Advised pt will call once we get schedule for October.   Devere CHRISTELLA Lodge, RN: Pt states he needs to reschedule colonoscopy scheduled for 8/26. States he will call office

## 2023-11-15 ENCOUNTER — Other Ambulatory Visit (HOSPITAL_COMMUNITY)

## 2023-11-19 ENCOUNTER — Ambulatory Visit (HOSPITAL_COMMUNITY): Admission: RE | Admit: 2023-11-19 | Source: Home / Self Care | Admitting: Gastroenterology

## 2023-11-19 ENCOUNTER — Encounter (HOSPITAL_COMMUNITY): Admission: RE | Payer: Self-pay | Source: Home / Self Care

## 2023-11-19 SURGERY — COLONOSCOPY
Anesthesia: Choice

## 2023-11-24 IMAGING — MR MR ABDOMEN WO/W CM
11 of 19 series · 24 of 48 positions shown · IV contrast (gadavist)
Comparison: Renal ultrasound, 02/23/2021

CLINICAL DATA: Right renal mass suspected by ultrasound

EXAM:
MRI ABDOMEN WITHOUT AND WITH CONTRAST
TECHNIQUE: Multiplanar multisequence MR imaging of the abdomen was performed
both before and after the administration of intravenous contrast.
CONTRAST:  10mL GADAVIST GADOBUTROL 1 MMOL/ML IV SOLN

[Series 2: cor ssfse nav · coronal · 6.0mm · 0.86mm/px · 2 of 41 slices shown]
[im 1/41]
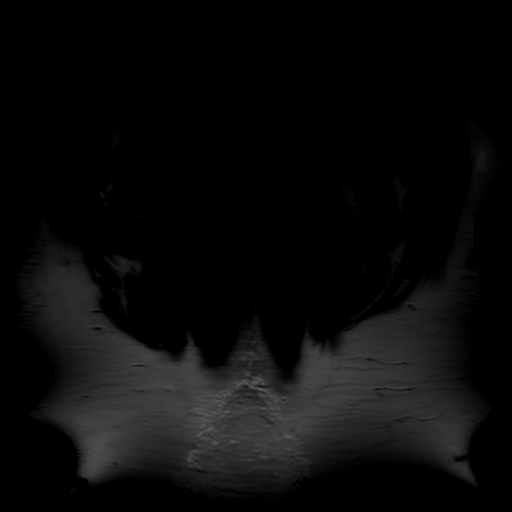
[im 41/41]
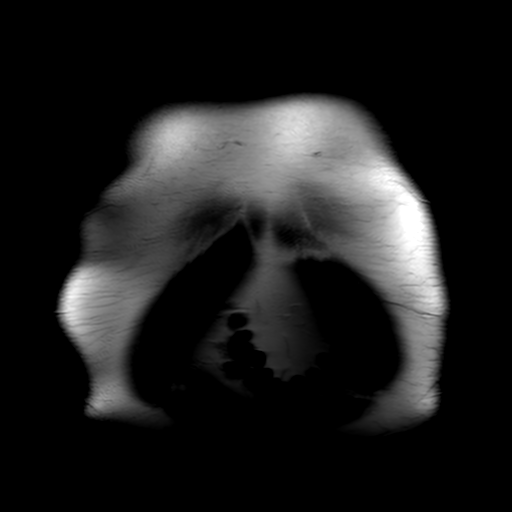

[Series 3: ax ssfse nav · axial · 6.0mm · 0.78mm/px · 1 of 40 slices shown]
[im 1/40]
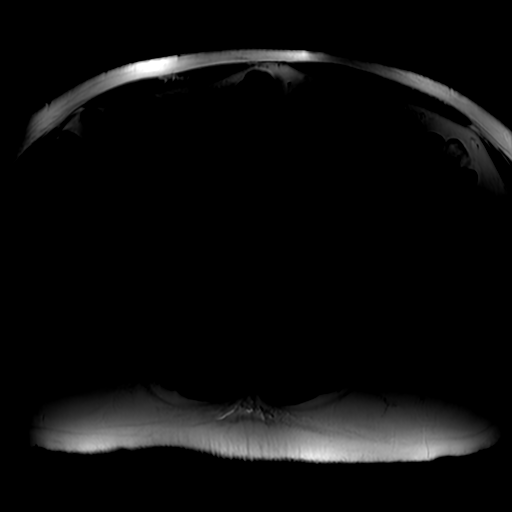

[Series 4: T2 fat-sat · axial · 6.0mm · 0.78mm/px · 1 of 40 slices shown (1 of 2)]
[im 1/40]
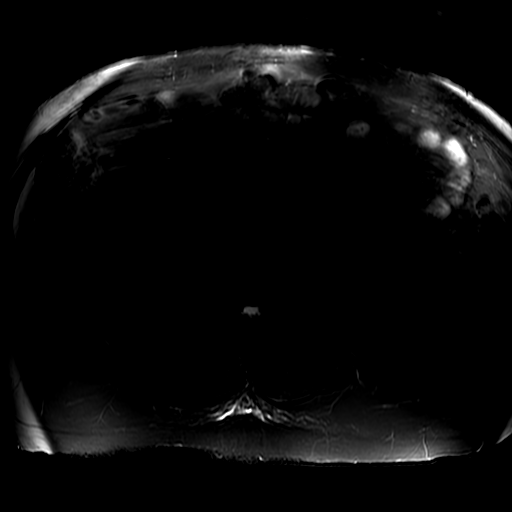

[Series 5: DWI b500 · axial · 8.0mm · 1.76mm/px · z∈[-146,+164]mm · 2 of 64 slices shown]
[im 1/64]
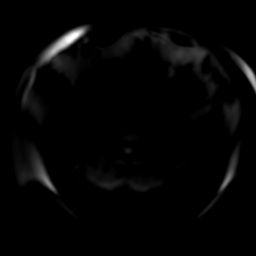
[im 64/64]
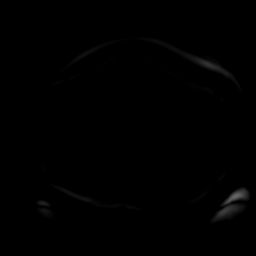

[Series 6: T2 fat-sat · axial · 6.0mm · 0.78mm/px · 1 of 40 slices shown (2 of 2)]
[im 1/40]
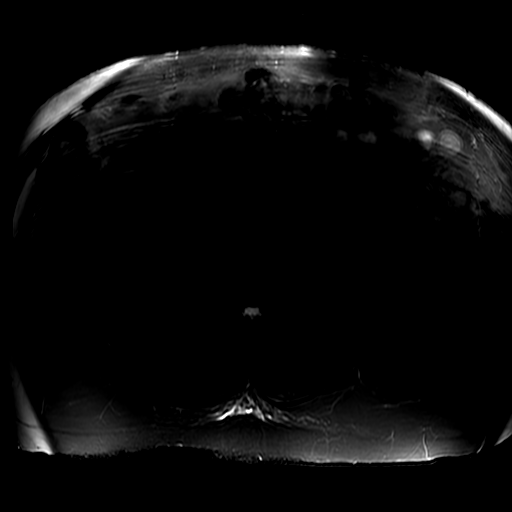

[Series 11: T1 dynamic · coronal · 3.3mm · 1.56mm/px · 4 of 140 slices shown]
[im 1/140]
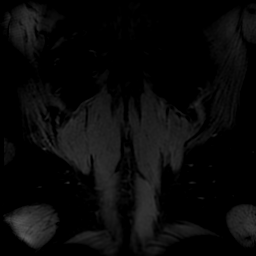
[im 47/140]
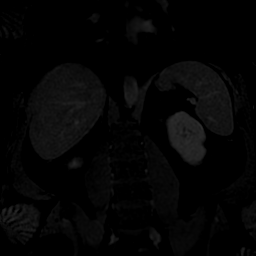
[im 93/140]
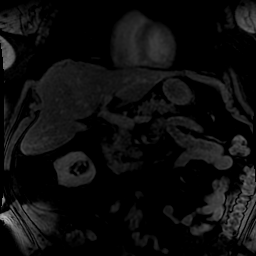
[im 140/140]
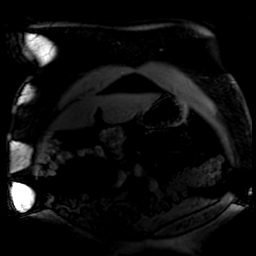

[Series 550: ADC · axial · 8.0mm · 1.76mm/px · 1 of 32 slices shown]
[im 1/32]
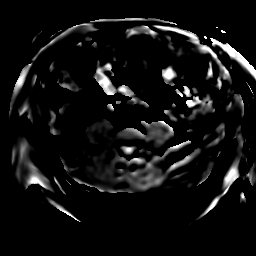

[Series 1000: T1 dynamic post-contrast · axial · non-contrast · 4.0mm · 0.86mm/px · z∈[-153,+93]mm · 3 of 124 slices shown (1 of 4)]
[im 1/124]
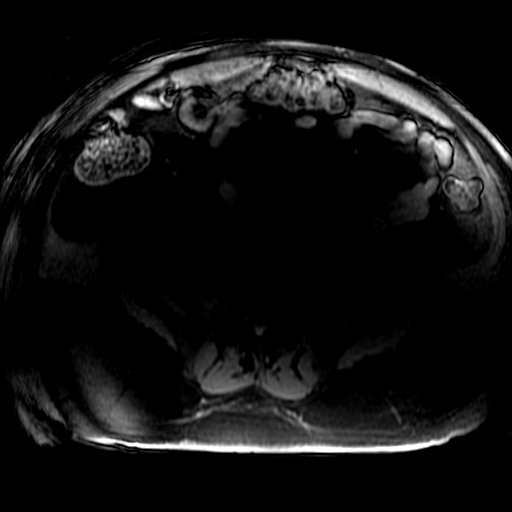
[im 62/124]
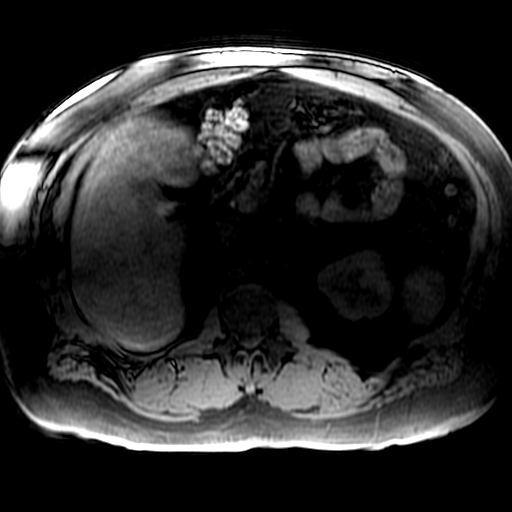
[im 124/124]
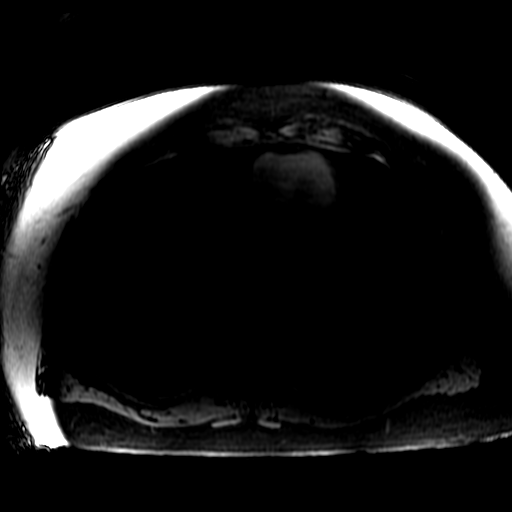

[Series 1001: T1 dynamic post-contrast · axial · non-contrast · 4.0mm · 0.86mm/px · z∈[-153,+93]mm · 3 of 124 slices shown (2 of 4)]
[im 1/124]
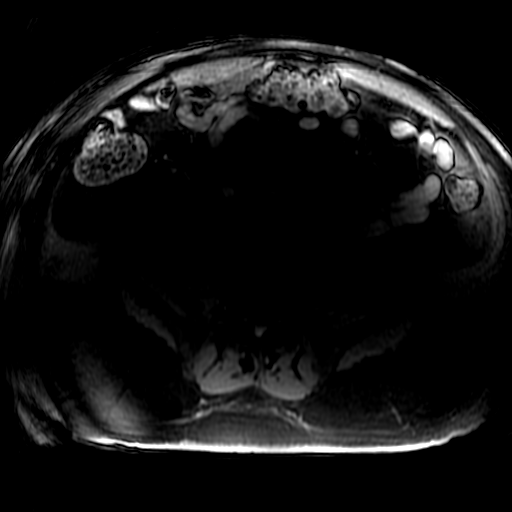
[im 62/124]
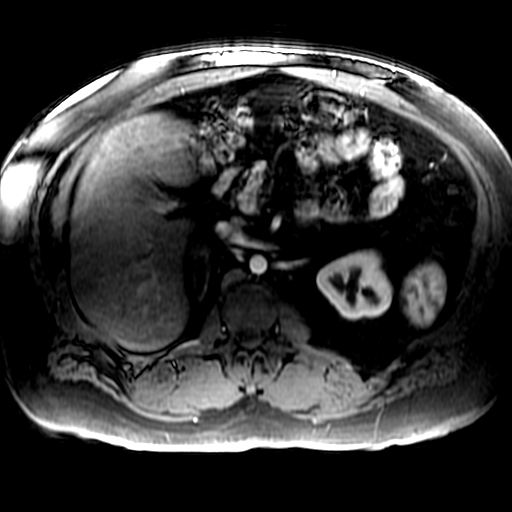
[im 124/124]
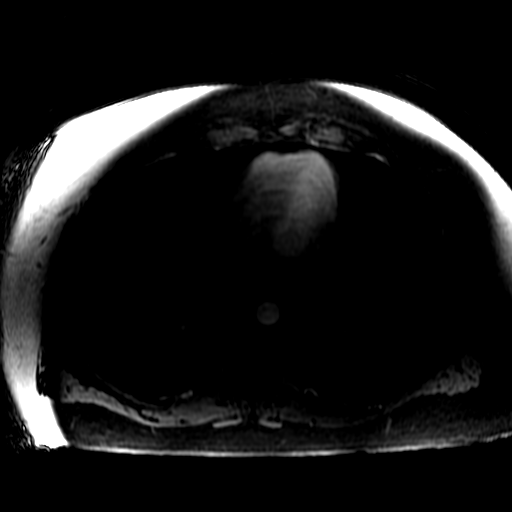

[Series 1002: T1 dynamic post-contrast · axial · non-contrast · 4.0mm · 0.86mm/px · z∈[-153,+93]mm · 3 of 124 slices shown (3 of 4)]
[im 1/124]
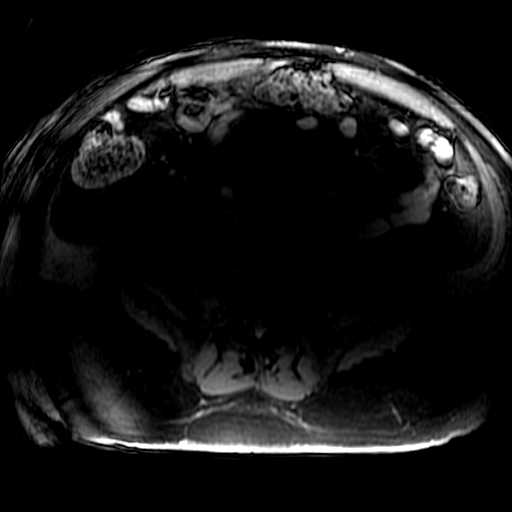
[im 62/124]
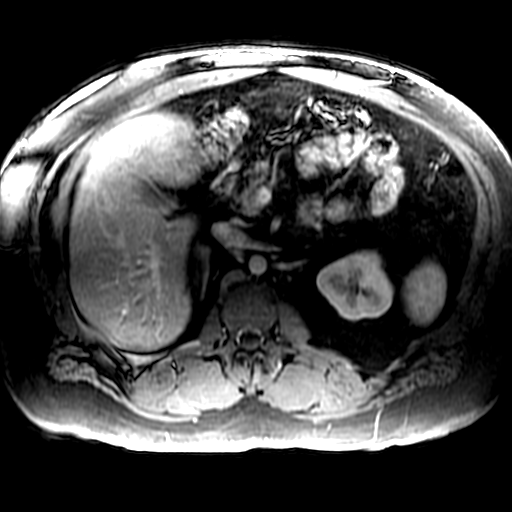
[im 124/124]
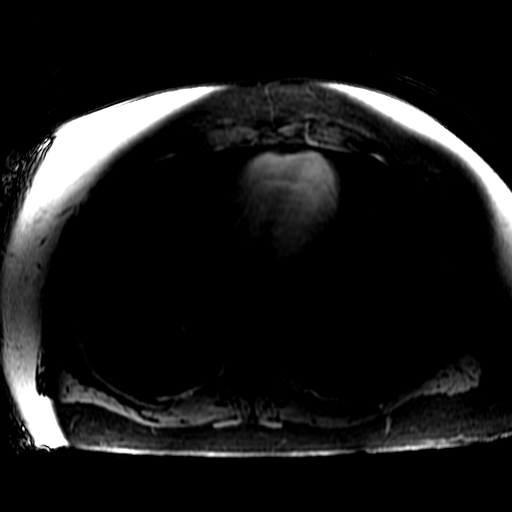

[Series 1003: T1 dynamic post-contrast · axial · non-contrast · 4.0mm · 0.86mm/px · z∈[-153,+93]mm · 3 of 124 slices shown (4 of 4)]
[im 1/124]
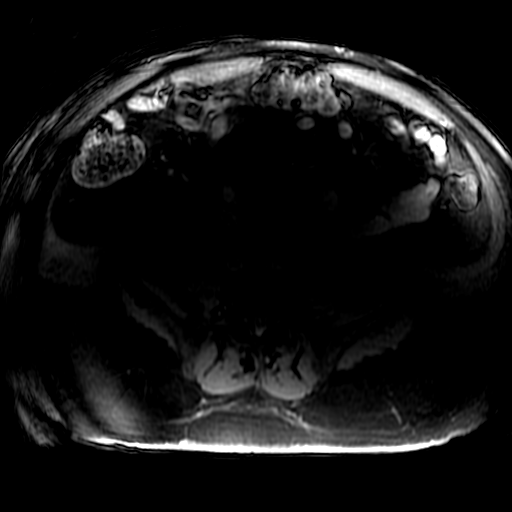
[im 62/124]
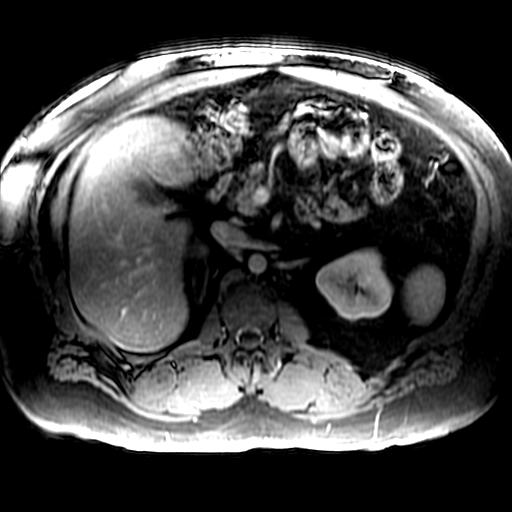
[im 124/124]
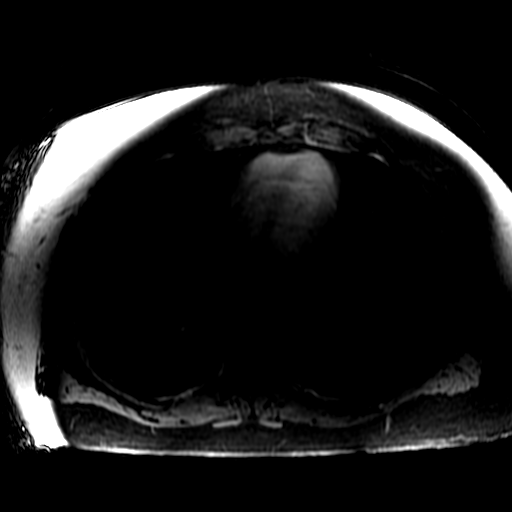

[24 of 48 positions shown; findings below may reference images not displayed]

FINDINGS: Lower chest: No acute findings.

Hepatobiliary: Hepatic steatosis. No mass or other parenchymal
abnormality identified. No gallstones. No biliary ductal dilatation.

Pancreas: No mass, inflammatory changes, or other parenchymal
abnormality identified. No pancreatic ductal dilatation.

Spleen:  Within normal limits in size and appearance.

Adrenals/Urinary Tract: No masses identified. The right kidney is
malrotated. Prominent cortical lobulations, particularly of the
right kidney (series 2222, image 92). No mass or suspicious contrast
enhancement. No evidence of hydronephrosis.

Stomach/Bowel: Visualized portions within the abdomen are
unremarkable.

Vascular/Lymphatic: No pathologically enlarged lymph nodes
identified. No abdominal aortic aneurysm demonstrated.

Other:  None.

Musculoskeletal: No suspicious bone lesions identified.
IMPRESSION: 1. No evidence of right renal mass or suspicious contrast
enhancement. The right kidney is malrotated with prominent cortical
lobulations explaining ultrasound appearance.
2. Hepatic steatosis.

## 2023-12-05 NOTE — Telephone Encounter (Signed)
 Called pt to schedule TCS, no answer. LVM.

## 2023-12-11 ENCOUNTER — Other Ambulatory Visit (HOSPITAL_COMMUNITY): Payer: Self-pay | Admitting: Physician Assistant

## 2023-12-18 DIAGNOSIS — Z0000 Encounter for general adult medical examination without abnormal findings: Secondary | ICD-10-CM | POA: Diagnosis not present

## 2023-12-18 DIAGNOSIS — Z1331 Encounter for screening for depression: Secondary | ICD-10-CM | POA: Diagnosis not present

## 2023-12-18 DIAGNOSIS — E119 Type 2 diabetes mellitus without complications: Secondary | ICD-10-CM | POA: Diagnosis not present

## 2023-12-18 DIAGNOSIS — Z299 Encounter for prophylactic measures, unspecified: Secondary | ICD-10-CM | POA: Diagnosis not present

## 2023-12-18 DIAGNOSIS — N1831 Chronic kidney disease, stage 3a: Secondary | ICD-10-CM | POA: Diagnosis not present

## 2023-12-18 DIAGNOSIS — I10 Essential (primary) hypertension: Secondary | ICD-10-CM | POA: Diagnosis not present

## 2023-12-18 DIAGNOSIS — E7800 Pure hypercholesterolemia, unspecified: Secondary | ICD-10-CM | POA: Diagnosis not present

## 2023-12-18 DIAGNOSIS — R5383 Other fatigue: Secondary | ICD-10-CM | POA: Diagnosis not present

## 2024-01-02 ENCOUNTER — Other Ambulatory Visit (INDEPENDENT_AMBULATORY_CARE_PROVIDER_SITE_OTHER): Payer: Self-pay | Admitting: *Deleted

## 2024-01-02 ENCOUNTER — Encounter (INDEPENDENT_AMBULATORY_CARE_PROVIDER_SITE_OTHER): Payer: Self-pay | Admitting: *Deleted

## 2024-01-02 MED ORDER — PEG 3350-KCL-NA BICARB-NACL 420 G PO SOLR: 4000.0000 mL | Freq: Once | ORAL | 0 refills | Status: DC

## 2024-02-03 ENCOUNTER — Telehealth (INDEPENDENT_AMBULATORY_CARE_PROVIDER_SITE_OTHER): Payer: Self-pay | Admitting: *Deleted

## 2024-02-03 ENCOUNTER — Encounter (HOSPITAL_COMMUNITY): Payer: Self-pay | Admitting: Physician Assistant

## 2024-02-03 ENCOUNTER — Ambulatory Visit (HOSPITAL_COMMUNITY)
Admission: RE | Admit: 2024-02-03 | Discharge: 2024-02-03 | Disposition: A | Discharge status: Home / Self Care | Source: Ambulatory Visit | Attending: Physician Assistant | Admitting: Physician Assistant

## 2024-02-03 VITALS — BP 104/66 | HR 63 | Ht 71.0 in | Wt 277.6 lb

## 2024-02-03 DIAGNOSIS — Z5181 Encounter for therapeutic drug level monitoring: Secondary | ICD-10-CM | POA: Diagnosis not present

## 2024-02-03 DIAGNOSIS — D6869 Other thrombophilia: Secondary | ICD-10-CM

## 2024-02-03 DIAGNOSIS — Z79899 Other long term (current) drug therapy: Secondary | ICD-10-CM | POA: Diagnosis not present

## 2024-02-03 DIAGNOSIS — I480 Paroxysmal atrial fibrillation: Secondary | ICD-10-CM

## 2024-02-03 NOTE — Telephone Encounter (Signed)
 Pt is cancelling his procedure at this time. He says he just has a lot going on right now. He wishes to be rescheduled in Jan. Advised pt that will get him rescheduled once we get providers schedule for Jan.

## 2024-02-03 NOTE — Progress Notes (Signed)
 Primary Care Physician: Maree Isles, MD Primary Cardiologist: Dr Pietro Primary Electrophysiologist: Dr Cindie Referring Physician: Dr Pietro Estrin Albert [name] is a 53 y.o. male with a history of DM, HTN, GERD, OSA, and paroxysmal atrial fibrillation who presents for follow up in the [facility]. The patient was initially diagnosed with atrial fibrillation 12/25/19 after presenting to the ED with symptoms of palpitations, SOB, and some chest heaviness. He states he had brief, intermittent palpitations for two weeks prior. Patient was started on Eliquis  for stroke prevention. He converted back to SR spontaneously in the ED. Since then, he has had some mild palpitations almost daily. He did have another extended episode of heart racing on 10/8 which resolved when he took his BB. He is compliant with his CPAP and denies any alcohol use.   Patient was hospitalized 11/28/20 for a suspected CVA. He lost consciousness while watching TV for 10 seconds and when he woke he  had right sided numbness and limb heaviness. Imaging showed no new infarct but it did show evidence of a prior CVA. Etiology was unclear with question for TIA. Patient was also seen at the ED 12/12/20 for afib lasting 2-3 hours. He converted to SR prior to arrival.  Patient returns for follow up for atrial fibrillation and Multaq  monitoring. He is in SR today and feels well. He denies any recent symptoms of afib. No bleeding issues on anticoagulation.   Today, he  denies symptoms of palpitations, chest pain, shortness of breath, orthopnea, PND, lower extremity edema, dizziness, presyncope, syncope, bleeding, or neurologic sequela. The patient is tolerating medications without difficulties and is otherwise without complaint today.    Atrial Fibrillation Risk Factors:  he does have symptoms or diagnosis of sleep apnea. he does not have a history of rheumatic fever. he does not have a history of  alcohol use. The patient does not have a history of early familial atrial fibrillation or other arrhythmias.   Atrial Fibrillation Management history:  Previous antiarrhythmic drugs: Multaq  Previous cardioversions: none Previous ablations: none Anticoagulation history: Eliquis    Past Medical History:  Diagnosis Date   Atrial fibrillation (HCC)    CVA (cerebral vascular accident) (HCC) 2022   Diabetes mellitus without complication (HCC)    GERD (gastroesophageal reflux disease)    Gout    Hypertension    Low testosterone    Polycythemia, 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   Sleep apnea     Current Outpatient Medications  Medication Sig Dispense Refill   acetaminophen  (TYLENOL ) 650 MG CR tablet Take 650 mg by mouth every 8 (eight) hours as needed for pain.     apixaban  (ELIQUIS ) 5 MG TABS tablet TAKE ONE TABLET BY MOUTH TWICE A DAY 180 tablet 3   clobetasol cream (TEMOVATE) 0.05 % Apply 1 Application topically as needed.     colchicine 0.6 MG tablet Take 0.6 mg by mouth daily. As needed for gout     diltiazem  (CARDIZEM ) 60 MG tablet TAKE 1 TABLET EVERY 4 HOURS AS NEEDED FOR AFIB HEART RATE GREATER THAN 100 45 tablet 1   dronedarone  (MULTAQ ) 400 MG tablet TAKE 1 TABLET BY MOUTH TWICE DAILY WITH A MEAL 60 tablet 11   febuxostat  (ULORIC ) 40 MG tablet Take 40 mg by mouth daily.     fluticasone  (FLONASE ) 50 MCG/ACT nasal spray Place 1 spray into both nostrils daily as needed for allergies or rhinitis.     Magnesium  200 MG TABS Take 800 mg by mouth daily.  Taking 4 tablets by mouth daily- 800mg  total     meloxicam (MOBIC) 15 MG tablet Take 15 mg by mouth as needed for pain.     metoprolol  tartrate (LOPRESSOR ) 25 MG tablet Take 1 tablet (25 mg total) by mouth 2 (two) times daily. 180 tablet 2   pantoprazole  (PROTONIX ) 40 MG tablet Take 1 tablet (40 mg total) by mouth daily. 30 tablet 0   predniSONE (DELTASONE) 5 MG tablet Take 5 mg by mouth as needed (As needed for gout flare ups).     tadalafil  (CIALIS) 20 MG tablet Take 20 mg by mouth daily as needed for erectile dysfunction.     testosterone cypionate (DEPOTESTOSTERONE CYPIONATE) 200 MG/ML injection Inject 100 mg into the muscle. Every 10 Days     No current facility-administered medications for this encounter.    ROS- All systems are reviewed and negative except as per the HPI above.  Physical Exam: Vitals:   02/03/24 0829  BP: 104/66  Pulse: 63  Weight: 125.9 kg  Height: 5' 11 (1.803 m)    GEN: Well nourished, well developed in no acute distress CARDIAC: Regular rate and rhythm, no murmurs, rubs, gallops RESPIRATORY:  Clear to auscultation without rales, wheezing or rhonchi  ABDOMEN: Soft, non-tender, non-distended EXTREMITIES:  No edema; No deformity    Wt Readings from Last 3 Encounters:  02/03/24 125.9 kg  08/05/23 124 kg  02/04/23 121.8 kg    EKG today demonstrates  SR Vent. rate 63 BPM PR interval 194 ms QRS duration 96 ms QT/QTcB 410/419 ms   Echo 11/29/20 demonstrated  1. Left ventricular ejection fraction, by estimation, is 55 to 60%. The  left ventricle has normal function. The left ventricle has no regional  wall motion abnormalities. Left ventricular diastolic parameters were  normal.   2. Right ventricular systolic function was not well visualized. The right ventricular size is not well visualized.   3. The mitral valve is grossly normal. Trivial mitral valve  regurgitation.   4. The aortic valve is grossly normal. Aortic valve regurgitation is not visualized. No aortic stenosis is present.    Epic records are reviewed at length today   CHA2DS2-VASc Score = 4  The patient's score is based upon: CHF History: 0 HTN History: 1 Diabetes History: 1 Stroke History: 2 Vascular Disease History: 0 Age Score: 0 Gender Score: 0       ASSESSMENT AND PLAN: Paroxysmal Atrial Fibrillation (ICD10:  I48.0) The patient's CHA2DS2-VASc score is 4, indicating a 4.8% annual risk of stroke.    Patient appears to be maintaining SR Continue Eliquis  5 mg BID Continue Lopressor  25 mg BID Continue Multaq  400 mg BID Continue diltiazem  30 mg PRN q 4 hours for heart racing.   Secondary Hypercoagulable State (ICD10:  D68.69) The patient is at significant risk for stroke/thromboembolism based upon his CHA2DS2-VASc Score of 4.  Continue Apixaban  (Eliquis ). No bleeding issues.   High Risk Medication Monitoring (ICD 10: [id number]) Patient requires ongoing monitoring for anti-arrhythmic medication which has the potential to cause life threatening arrhythmias. Intervals on ECG acceptable for dronedarone  monitoring.      Obesity Body mass index is 38.72 kg/m.  Encouraged lifestyle modification  OSA  Encouraged nightly CPAP  HTN Stable on current regimen   Follow up in the AF clinic in 6 months.    Daril Kicks PA-C [facility] [address] [place], [place] [address] [phone] 02/03/2024 8:45 AM

## 2024-02-05 ENCOUNTER — Ambulatory Visit (HOSPITAL_COMMUNITY): Admission: RE | Admit: 2024-02-05 | Source: Home / Self Care | Admitting: Gastroenterology

## 2024-02-05 ENCOUNTER — Encounter (HOSPITAL_COMMUNITY): Payer: Self-pay | Admitting: Anesthesiology

## 2024-02-05 ENCOUNTER — Encounter (HOSPITAL_COMMUNITY): Admission: RE | Payer: Self-pay | Source: Home / Self Care

## 2024-02-05 SURGERY — COLONOSCOPY
Anesthesia: Choice

## 2024-02-09 ENCOUNTER — Other Ambulatory Visit (HOSPITAL_COMMUNITY): Payer: Self-pay | Admitting: Physician Assistant

## 2024-03-09 NOTE — Telephone Encounter (Signed)
 LMOVM to return call  TCS w/Dr.Ahmed, any room Miralax twice a day x 10 days with complete prep the day before the procedure

## 2024-03-12 ENCOUNTER — Encounter: Payer: Self-pay | Admitting: *Deleted

## 2024-03-23 NOTE — Telephone Encounter (Signed)
 Pt has been rescheduled for 04/03/24. Updated instructions mailed.

## 2024-04-03 ENCOUNTER — Ambulatory Visit (HOSPITAL_COMMUNITY): Admitting: Anesthesiology

## 2024-04-03 ENCOUNTER — Encounter (HOSPITAL_COMMUNITY)
Admission: RE | Disposition: A | Discharge status: Home / Self Care | Payer: Self-pay | Source: Home / Self Care | Attending: Gastroenterology

## 2024-04-03 ENCOUNTER — Encounter (HOSPITAL_COMMUNITY): Payer: Self-pay | Admitting: Gastroenterology

## 2024-04-03 ENCOUNTER — Ambulatory Visit (HOSPITAL_COMMUNITY)
Admission: RE | Admit: 2024-04-03 | Discharge: 2024-04-03 | Disposition: A | Discharge status: Home / Self Care | Attending: Gastroenterology | Admitting: Gastroenterology

## 2024-04-03 ENCOUNTER — Other Ambulatory Visit: Payer: Self-pay

## 2024-04-03 DIAGNOSIS — Z860100 Personal history of colon polyps, unspecified: Secondary | ICD-10-CM | POA: Diagnosis not present

## 2024-04-03 DIAGNOSIS — Z1211 Encounter for screening for malignant neoplasm of colon: Secondary | ICD-10-CM | POA: Diagnosis not present

## 2024-04-03 DIAGNOSIS — I4891 Unspecified atrial fibrillation: Secondary | ICD-10-CM | POA: Diagnosis not present

## 2024-04-03 DIAGNOSIS — G4730 Sleep apnea, unspecified: Secondary | ICD-10-CM | POA: Insufficient documentation

## 2024-04-03 DIAGNOSIS — Z800 Family history of malignant neoplasm of digestive organs: Secondary | ICD-10-CM | POA: Insufficient documentation

## 2024-04-03 DIAGNOSIS — E119 Type 2 diabetes mellitus without complications: Secondary | ICD-10-CM | POA: Diagnosis not present

## 2024-04-03 DIAGNOSIS — K635 Polyp of colon: Secondary | ICD-10-CM | POA: Diagnosis not present

## 2024-04-03 DIAGNOSIS — D125 Benign neoplasm of sigmoid colon: Secondary | ICD-10-CM

## 2024-04-03 DIAGNOSIS — K648 Other hemorrhoids: Secondary | ICD-10-CM | POA: Diagnosis not present

## 2024-04-03 DIAGNOSIS — I10 Essential (primary) hypertension: Secondary | ICD-10-CM | POA: Insufficient documentation

## 2024-04-03 HISTORY — PX: COLONOSCOPY: SHX5424

## 2024-04-03 HISTORY — PX: POLYPECTOMY: SHX149

## 2024-04-03 LAB — HM COLONOSCOPY

## 2024-04-03 SURGERY — COLONOSCOPY
Anesthesia: Monitor Anesthesia Care

## 2024-04-03 MED ORDER — LIDOCAINE 2% (20 MG/ML) 5 ML SYRINGE
INTRAMUSCULAR | Status: DC | PRN
  Administered 2024-04-03: 60 mg via INTRAVENOUS

## 2024-04-03 MED ORDER — LACTATED RINGERS IV SOLN: INTRAVENOUS | Status: DC

## 2024-04-03 MED ORDER — PHENYLEPHRINE 80 MCG/ML (10ML) SYRINGE FOR IV PUSH (FOR BLOOD PRESSURE SUPPORT)
PREFILLED_SYRINGE | INTRAVENOUS | Status: DC | PRN
  Administered 2024-04-03: 160 ug via INTRAVENOUS
  Administered 2024-04-03: 80 ug via INTRAVENOUS

## 2024-04-03 MED ORDER — SODIUM CHLORIDE 0.9 % IV SOLN: INTRAVENOUS | Status: DC | PRN

## 2024-04-03 MED ORDER — PROPOFOL 500 MG/50ML IV EMUL
INTRAVENOUS | Status: DC | PRN
  Administered 2024-04-03: 100 mg via INTRAVENOUS
  Administered 2024-04-03: 150 ug/kg/min via INTRAVENOUS

## 2024-04-03 MED ORDER — EPHEDRINE SULFATE (PRESSORS) 25 MG/5ML IV SOSY
PREFILLED_SYRINGE | INTRAVENOUS | Status: DC | PRN
  Administered 2024-04-03: 10 mg via INTRAVENOUS
  Administered 2024-04-03: 5 mg via INTRAVENOUS

## 2024-04-03 NOTE — Anesthesia Preprocedure Evaluation (Signed)
"                                    Anesthesia Evaluation  Patient identified by MRN, date of birth, ID band Patient awake    Reviewed: Allergy & Precautions, H&P , NPO status , Patient's Chart, lab work & pertinent test results, reviewed documented beta blocker date and time   Airway Mallampati: II  TM Distance: >3 FB Neck ROM: full    Dental no notable dental hx.    Pulmonary sleep apnea    Pulmonary exam normal breath sounds clear to auscultation       Cardiovascular Exercise Tolerance: Good hypertension, + dysrhythmias Atrial Fibrillation  Rhythm:regular Rate:Normal     Neuro/Psych CVA  negative psych ROS   GI/Hepatic Neg liver ROS,GERD  ,,  Endo/Other  diabetes    Renal/GU Renal disease  negative genitourinary   Musculoskeletal   Abdominal   Peds  Hematology negative hematology ROS (+)   Anesthesia Other Findings   Reproductive/Obstetrics negative OB ROS                              Anesthesia Physical Anesthesia Plan  ASA: 3  Anesthesia Plan: MAC   Post-op Pain Management:    Induction:   PONV Risk Score and Plan: Propofol  infusion  Airway Management Planned:   Additional Equipment:   Intra-op Plan:   Post-operative Plan:   Informed Consent: I have reviewed the patients History and Physical, chart, labs and discussed the procedure including the risks, benefits and alternatives for the proposed anesthesia with the patient or authorized representative who has indicated his/her understanding and acceptance.     Dental Advisory Given  Plan Discussed with: CRNA  Anesthesia Plan Comments:         Anesthesia Quick Evaluation  "

## 2024-04-03 NOTE — Transfer of Care (Addendum)
 Immediate Anesthesia Transfer of Care Note  Patient: Albert [name]  Procedure(s) Performed: COLONOSCOPY POLYPECTOMY, INTESTINE  Patient Location: Endoscopy Unit  Anesthesia Type:MAC  Level of Consciousness: drowsy and patient cooperative  Airway & Oxygen Therapy: Patient Spontanous Breathing  Post-op Assessment: Report given to RN and Post -op Vital signs reviewed and stable  Post vital signs: Reviewed and stable  Last Vitals:  Vitals Value Taken Time  BP 117/49 04/03/24   0906  Temp 36.9 04/03/24   0906  Pulse 57 04/03/24   0906  Resp 17 04/03/24   0906  SpO2 97% 04/03/24   0906    Last Pain:  Vitals:   04/03/24 0825  TempSrc:   PainSc: 2       Patients Stated Pain Goal: 1 (04/03/24 0728)  Complications: No notable events documented.

## 2024-04-03 NOTE — Discharge Instructions (Signed)

## 2024-04-03 NOTE — Anesthesia Postprocedure Evaluation (Signed)
"   Anesthesia Post Note  Patient: Albert [name]  Procedure(s) Performed: COLONOSCOPY POLYPECTOMY, INTESTINE  Patient location during evaluation: Phase II Anesthesia Type: MAC Level of consciousness: awake Pain management: pain level controlled Vital Signs Assessment: post-procedure vital signs reviewed and stable Respiratory status: spontaneous breathing and respiratory function stable Cardiovascular status: blood pressure returned to baseline and stable Postop Assessment: no headache and no apparent nausea or vomiting Anesthetic complications: no Comments: Late entry   No notable events documented.   Last Vitals:  Vitals:   04/03/24 0728 04/03/24 0906  BP: (!) 109/53 (!) 117/49  Pulse: 72 (!) 57  Resp: 18 17  Temp: 36.9 C 36.9 C  SpO2: 100% 97%    Last Pain:  Vitals:   04/03/24 0917  TempSrc:   PainSc: 0-No pain                 Yvonna PARAS Sandon Yoho      "

## 2024-04-03 NOTE — H&P (Signed)
 Primary Care Physician:  Maree Isles, MD Primary Gastroenterologist:  Dr. Cinderella  Pre-Procedure History & Physical: HPI:  Albert [name] is a 54 y.o. male is here for a colonoscopy for Colon polyps, family history of colon cancer .  Family history significant for colon carcinoma in his father who was in his early or mid 2s and is doing fine at age 15 .  No melena or hematochezia.  No abdominal pain or unintentional weight loss.  No change in bowel habits.  Overall feels well from a GI standpoint.  Past Medical History:  Diagnosis Date   Atrial fibrillation (HCC)    CVA (cerebral vascular accident) (HCC) 2022   Diabetes mellitus without complication (HCC)    GERD (gastroesophageal reflux disease)    Gout    Hypertension    Low testosterone    Polycythemia, 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   Sleep apnea     Past Surgical History:  Procedure Laterality Date   CARDIAC CATHETERIZATION     COLONOSCOPY     COLONOSCOPY N/A 05/11/2015   Procedure: COLONOSCOPY;  Surgeon: Claudis RAYMOND Rivet, MD;  Location: [facility];  Service: Endoscopy;  Laterality: N/A;  930 - moved to 2/15 - Ann to notify   COLONOSCOPY WITH PROPOFOL  N/A 09/07/2020   Procedure: COLONOSCOPY WITH PROPOFOL ;  Surgeon: Rivet Claudis RAYMOND, MD;  Location: [facility];  Service: Endoscopy;  Laterality: N/A;  am   LAPAROSCOPIC APPENDECTOMY N/A 12/22/2021   Procedure: APPENDECTOMY LAPAROSCOPIC;  Surgeon: Mavis Anes, MD;  Location: [facility];  Service: General;  Laterality: N/A;   POLYPECTOMY  09/07/2020   Procedure: POLYPECTOMY;  Surgeon: Rivet Claudis RAYMOND, MD;  Location: [facility];  Service: Endoscopy;;    Prior to Admission medications  Medication Sig Start Date End Date Taking? Authorizing Provider  acetaminophen  (TYLENOL ) 650 MG CR tablet Take 650 mg by mouth every 8 (eight) hours as needed for pain.   Yes [provider]  clobetasol cream (TEMOVATE) 0.05 % Apply 1 Application topically as needed. 06/15/22  Yes [provider]  colchicine 0.6 MG tablet Take 0.6 mg by mouth daily. As needed for gout 02/19/22  Yes [provider]  diltiazem  (CARDIZEM ) 60 MG tablet TAKE 1 TABLET BY MOUTH EVERY 4 HOURS AS NEEDED FOR AFIB HEART RATE GREATER THAN 100 02/10/24  Yes Fenton, Clint R, PA  dronedarone  (MULTAQ ) 400 MG tablet TAKE 1 TABLET BY MOUTH TWICE DAILY WITH A MEAL 12/11/23  Yes Fenton, Clint R, PA  febuxostat  (ULORIC ) 40 MG tablet Take 40 mg by mouth daily.   Yes [provider]  fluticasone  (FLONASE ) 50 MCG/ACT nasal spray Place 1 spray into both nostrils daily as needed for allergies or rhinitis.   Yes [provider]  Magnesium  200 MG TABS Take 800 mg by mouth daily. Taking 4 tablets by mouth daily- 800mg  total   Yes [provider]  meloxicam (MOBIC) 15 MG tablet Take 15 mg by mouth as needed for pain. 12/16/19  Yes [provider]  metoprolol  tartrate (LOPRESSOR ) 25 MG tablet Take 1 tablet (25 mg total) by mouth 2 (two) times daily. 03/28/23  Yes Fenton, Clint R, PA  pantoprazole  (PROTONIX ) 40 MG tablet Take 1 tablet (40 mg total) by mouth daily. 09/24/19  Yes Long, Joshua G, MD  predniSONE (DELTASONE) 5 MG tablet Take 5 mg by mouth as needed (As needed for gout flare ups). 02/16/22  Yes [provider]  tadalafil (CIALIS) 20 MG tablet Take 20 mg by  mouth daily as needed for erectile dysfunction.   Yes [provider]  testosterone cypionate (DEPOTESTOSTERONE CYPIONATE) 200 MG/ML injection Inject 100 mg into the muscle. Every 10 Days   Yes [provider]  apixaban  (ELIQUIS ) 5 MG TABS tablet TAKE ONE TABLET BY MOUTH TWICE A DAY 10/29/23   Fenton, Clint R, PA    Allergies as of 03/23/2024 - Review Complete 02/03/2024  Allergen Reaction Noted   Penicillins Other (See Comments) 05/11/2015   Levaquin [levofloxacin] Palpitations 09/24/2019    Family History  Problem Relation Age of Onset   Colon cancer Father     Social History    Socioeconomic History   Marital status: Married    Spouse name: Vina   Number of children: Not on file   Years of education: Not on file   Highest education level: Master's degree (e.g., MA, MS, MEng, MEd, MSW, MBA)  Occupational History    Comment: accounting  Tobacco Use   Smoking status: Never   Smokeless tobacco: Never   Tobacco comments:    Never smoke 02/13/22  Vaping Use   Vaping status: Never Used  Substance and Sexual Activity   Alcohol use: Not Currently    Comment: occasionally   Drug use: No    Comment: reports use of CBG gummies   Sexual activity: Not on file  Other Topics Concern   Not on file  Social History Narrative   Lives with wife   Right Handed   2 Sodas daily   Social Drivers of Health   Tobacco Use: Low Risk (04/03/2024)   Patient History    Smoking Tobacco Use: Never    Smokeless Tobacco Use: Never    Passive Exposure: Not on file  Financial Resource Strain: Not on file  Food Insecurity: No Food Insecurity (12/21/2021)   Hunger Vital Sign    Worried About Running Out of Food in the Last Year: Never true    Ran Out of Food in the Last Year: Never true  Transportation Needs: No Transportation Needs (12/21/2021)   PRAPARE - Administrator, Civil Service (Medical): No    Lack of Transportation (Non-Medical): No  Physical Activity: Not on file  Stress: Not on file  Social Connections: Not on file  Intimate Partner Violence: Not At Risk (12/21/2021)   Humiliation, Afraid, Rape, and Kick questionnaire    Fear of Current or Ex-Partner: No    Emotionally Abused: No    Physically Abused: No    Sexually Abused: No  Depression (PHQ2-9): Not on file  Alcohol Screen: Not on file  Housing: Low Risk (12/21/2021)   Housing    Last Housing Risk Score: 0  Utilities: Not At Risk (12/21/2021)   AHC Utilities    Threatened with loss of utilities: No  Health Literacy: Not on file    Review of Systems: See HPI, otherwise negative  ROS  Physical Exam: Vital signs in last 24 hours:     General:   Alert,  Well-developed, well-nourished, pleasant and cooperative in NAD Head:  Normocephalic and atraumatic. Eyes:  Sclera clear, no icterus.   Conjunctiva pink. Ears:  Normal auditory acuity. Nose:  No deformity, discharge,  or lesions. Msk:  Symmetrical without gross deformities. Normal posture. Extremities:  Without clubbing or edema. Neurologic:  Alert and  oriented x4;  grossly normal neurologically. Skin:  Intact without significant lesions or rashes. Psych:  Alert and cooperative. Normal mood and affect.  Impression/Plan: Albert [name] is a 54  y.o. male is here for a colonoscopy for Colon polyps, family history of colon cancer .    The risks of the procedure including infection, bleed, or perforation as well as benefits, limitations, alternatives and imponderables have been reviewed with the patient. Questions have been answered. All parties agreeable.

## 2024-04-03 NOTE — Anesthesia Procedure Notes (Signed)
 Date/Time: 04/03/2024 8:26 AM  Performed by: Para Jerelene CROME, CRNAOxygen Delivery Method: Nasal cannula

## 2024-04-03 NOTE — Op Note (Signed)
 [facility] Patient Name: Albert [name] Procedure Date: 04/03/2024 8:06 AM MRN: [id number] Date of Birth: 1971/01/05 Attending MD: Deatrice Dine , MD, [id number] CSN: [id number] Age: 54 Admit Type: Outpatient Procedure:                Colonoscopy Indications:              High risk colon cancer surveillance: Personal                            history of colonic polyps, Family history of colon                            cancer in a first-degree relative before age 75                            years Providers:                Deatrice Dine, MD, Devere Lodge, Chad Wilson,                            Technician Referring MD:              Medicines:                Monitored Anesthesia Care Complications:            No immediate complications. Estimated Blood Loss:     Estimated blood loss was minimal. Procedure:                Pre-Anesthesia Assessment:                           - Prior to the procedure, a History and Physical                            was performed, and patient medications and                            allergies were reviewed. The patient's tolerance of                            previous anesthesia was also reviewed. The risks                            and benefits of the procedure and the sedation                            options and risks were discussed with the patient.                            All questions were answered, and informed consent                            was obtained. Prior Anticoagulants: The patient has                            taken Eliquis  (  apixaban ), last dose was 2 days                            prior to procedure. ASA Grade Assessment: III - A                            patient with severe systemic disease. After                            reviewing the risks and benefits, the patient was                            deemed in satisfactory condition to undergo the                            procedure.                            After obtaining informed consent, the colonoscope                            was passed under direct vision. Throughout the                            procedure, the patient's blood pressure, pulse, and                            oxygen saturations were monitored continuously. The                            [id number] ([id number]) Colon was introduced through                            the anus and advanced to the the cecum, identified                            by appendiceal orifice and ileocecal valve. The                            colonoscopy was performed without difficulty. The                            patient tolerated the procedure well. The quality                            of the bowel preparation was fair. Scope In: 8:34:22 AM Scope Out: 9:00:41 AM Scope Withdrawal Time: 0 hours 22 minutes 38 seconds  Total Procedure Duration: 0 hours 26 minutes 19 seconds  Findings:      The perianal and digital rectal examinations were normal.      A moderate amount of stool was found in the entire colon, precluding       visualization. Lavage of the area was performed using a large amount of       sterile water , resulting in clearance with fair visualization.  A 5 mm polyp was found in the sigmoid colon. The polyp was sessile. The       polyp was removed with a cold snare. Resection and retrieval were       complete.      Non-bleeding internal hemorrhoids were found during retroflexion. The       hemorrhoids were small. Impression:               - Preparation of the colon was fair.                           - Stool in the entire examined colon.                           - One 5 mm polyp in the sigmoid colon, removed with                            a cold snare. Resected and retrieved.                           - Non-bleeding internal hemorrhoids. Moderate Sedation:      Per Anesthesia Care Recommendation:           - Patient has a contact number available for                             emergencies. The signs and symptoms of potential                            delayed complications were discussed with the                            patient. Return to normal activities tomorrow.                            Written discharge instructions were provided to the                            patient.                           - Resume previous diet.                           - Continue present medications.                           - Await pathology results.                           - Repeat colonoscopy in 1 year for surveillance                            given fair bowel prep. Patient can not tolerate                            liquid bowel prep. Will give SUTABs  and miralax BID                            -10days next time Procedure Code(s):        --- Professional ---                           [id number], Colonoscopy, flexible; with removal of                            tumor(s), polyp(s), or other lesion(s) by snare                            technique Diagnosis Code(s):        --- Professional ---                           Z86.010, Personal history of colonic polyps                           K64.8, Other hemorrhoids                           D12.5, Benign neoplasm of sigmoid colon                           Z80.0, Family history of malignant neoplasm of                            digestive organs CPT copyright 2022 [employer]. All rights reserved. The codes documented in this report are preliminary and upon coder review may  be revised to meet current compliance requirements. Deatrice Dine, MD Deatrice Dine, MD 04/03/2024 9:11:42 AM This report has been signed electronically. Number of Addenda: 0

## 2024-04-06 ENCOUNTER — Encounter (HOSPITAL_COMMUNITY): Payer: Self-pay | Admitting: Gastroenterology

## 2024-04-06 LAB — SURGICAL PATHOLOGY

## 2024-04-07 ENCOUNTER — Encounter (INDEPENDENT_AMBULATORY_CARE_PROVIDER_SITE_OTHER): Payer: Self-pay | Admitting: *Deleted

## 2024-04-08 ENCOUNTER — Other Ambulatory Visit (HOSPITAL_COMMUNITY): Payer: Self-pay | Admitting: Physician Assistant

## 2024-04-10 ENCOUNTER — Other Ambulatory Visit (HOSPITAL_COMMUNITY): Payer: Self-pay | Admitting: Physician Assistant

## 2024-04-13 ENCOUNTER — Ambulatory Visit (INDEPENDENT_AMBULATORY_CARE_PROVIDER_SITE_OTHER): Payer: Self-pay | Admitting: Gastroenterology

## 2024-04-13 NOTE — Progress Notes (Signed)
 1 yr TCS noted in recall Patient result letter mailed procedure note and pathology result faxed to PCP

## 2024-08-03 ENCOUNTER — Ambulatory Visit (HOSPITAL_COMMUNITY): Admitting: Physician Assistant
